# Patient Record
Sex: Male | Born: 1945 | Race: Black or African American | Hispanic: No | Marital: Married | Smoking: Former smoker
Health system: Southern US, Community
[De-identification: ages and names within clinical notes are randomized; demographics above are authoritative.]

## PROBLEM LIST (undated history)

## (undated) DIAGNOSIS — E785 Hyperlipidemia, unspecified: Secondary | ICD-10-CM

## (undated) DIAGNOSIS — Z789 Other specified health status: Secondary | ICD-10-CM

## (undated) DIAGNOSIS — N400 Enlarged prostate without lower urinary tract symptoms: Secondary | ICD-10-CM

## (undated) DIAGNOSIS — I214 Non-ST elevation (NSTEMI) myocardial infarction: Secondary | ICD-10-CM

## (undated) DIAGNOSIS — K8020 Calculus of gallbladder without cholecystitis without obstruction: Secondary | ICD-10-CM

## (undated) DIAGNOSIS — I10 Essential (primary) hypertension: Secondary | ICD-10-CM

## (undated) DIAGNOSIS — Z951 Presence of aortocoronary bypass graft: Secondary | ICD-10-CM

## (undated) DIAGNOSIS — I2510 Atherosclerotic heart disease of native coronary artery without angina pectoris: Secondary | ICD-10-CM

## (undated) HISTORY — DX: Essential (primary) hypertension: I10

## (undated) HISTORY — DX: Benign prostatic hyperplasia without lower urinary tract symptoms: N40.0

---

## 1967-05-16 HISTORY — PX: APPENDECTOMY: SHX54

## 2000-06-14 ENCOUNTER — Ambulatory Visit (HOSPITAL_COMMUNITY): Admission: RE | Admit: 2000-06-14 | Discharge: 2000-06-14 | Payer: Self-pay | Admitting: Gastroenterology

## 2008-09-29 ENCOUNTER — Ambulatory Visit (HOSPITAL_COMMUNITY): Admission: RE | Admit: 2008-09-29 | Discharge: 2008-09-29 | Payer: Self-pay | Admitting: Gastroenterology

## 2009-11-29 ENCOUNTER — Encounter: Admission: RE | Admit: 2009-11-29 | Discharge: 2009-11-29 | Payer: Self-pay | Admitting: Emergency Medicine

## 2010-01-13 HISTORY — PX: HERNIA REPAIR: SHX51

## 2010-09-27 NOTE — Op Note (Signed)
NAME:  Tyler Fox, Tyler Fox NO.:  [id number]   MEDICAL RECORD NO.:  [id number]          PATIENT TYPE:  AMB   LOCATION:  ENDO                         FACILITY:  [facility]   PHYSICIAN:  Bernette Redbird, M.D.   DATE OF BIRTH:  [birth year]   DATE OF PROCEDURE:  09/29/2008  DATE OF DISCHARGE:                               OPERATIVE REPORT   PROCEDURE:  Colonoscopy.   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   FINDINGS:  Normal exam.   DESCRIPTION OF PROCEDURE:  The nature, purpose and risks of the  procedure were familiar to the patient from prior examination and he  provided written consent.  Sedation was fentanyl 100 mcg and Versed 8 mg  IV without arrhythmias or desaturation.  Digital exam of the prostate  was normal.  The Pentax adult video colonoscope was advanced to the  terminal ileum without significant difficulty, using some external  abdominal compression to control looping, and pullback was then  performed.  The quality of the prep was excellent and it was felt that  all areas were well seen.   This was a normal examination.  No polyps, cancer, colitis, vascular  malformations or diverticulosis were noted and retroflexion in the  rectum was normal other than and few foci of rectal erythema.  No  biopsies were obtained.  The patient tolerated the procedure well and  there were no apparent complications.   IMPRESSION:  Normal screening colonoscopy in a standard risk individual.   PLAN:  Follow-up colonoscopy in 10 years per the current recommendations  of the [employer] cancer Society.           ______________________________  Bernette Redbird, M.D.     RB/MEDQ  D:  09/29/2008  T:  09/29/2008  Job:  [id number]   cc:   Reuben Likes, M.D.  Fax: [phone]

## 2010-09-30 NOTE — Procedures (Signed)
[facility]  Patient:    Tyler Fox, Tyler Fox                     MRN: [id number] Proc. Date: 06/14/00 Adm. Date:  [id number] Disc. Date: [id number] Attending:  Rich Brave CC:         Reuben Likes, M.D.   Procedure Report  PROCEDURE:  Colonoscopy.  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  He is asymptomatic.  FINDINGS:  Normal examination to the cecum.  DESCRIPTION OF PROCEDURE:  The nature, purpose and risks of the procedure have been discussed with the patient, who provided written consent.  Sedation was fentanyl 50 mcg and Versed 5 mg IV, without arrhythmias or desaturation.  The Olympus adult video colonoscope was advanced to the cecum following an unremarkable digital rectal exam, including the prostate gland.  The cecum was identified by typical cecal appearance and the absence of further lumen. Pullback was then performed.  The quality of the prep was very good and it is felt that all areas were well seen.  This was a normal examination.  No polyps, cancer, colitis, vascular malformations or diverticulosis were noted.  Retroflexion in the the rectum was normal.  No biopsies were obtained.  The patient tolerated the procedure well and there were no apparent complications.  IMPRESSION:  Normal colonoscopy.  PLAN:  Consider follow-up colonoscopy, versus flexible sigmoidoscopy, five years from now. DD:  06/14/00 TD:  06/16/00 Job: [id number]

## 2012-04-16 ENCOUNTER — Emergency Department (HOSPITAL_COMMUNITY): Payer: BC Managed Care – PPO

## 2012-04-16 ENCOUNTER — Encounter (HOSPITAL_COMMUNITY): Payer: Self-pay | Admitting: Emergency Medicine

## 2012-04-16 ENCOUNTER — Inpatient Hospital Stay (HOSPITAL_COMMUNITY)
Admission: EM | Admit: 2012-04-16 | Discharge: 2012-04-24 | DRG: 546 | Disposition: A | Discharge status: Home / Self Care | Payer: BC Managed Care – PPO | Attending: Cardiothoracic Surgery | Admitting: Cardiothoracic Surgery

## 2012-04-16 DIAGNOSIS — R030 Elevated blood-pressure reading, without diagnosis of hypertension: Secondary | ICD-10-CM | POA: Diagnosis present

## 2012-04-16 DIAGNOSIS — E8779 Other fluid overload: Secondary | ICD-10-CM | POA: Diagnosis present

## 2012-04-16 DIAGNOSIS — I498 Other specified cardiac arrhythmias: Secondary | ICD-10-CM

## 2012-04-16 DIAGNOSIS — Z8249 Family history of ischemic heart disease and other diseases of the circulatory system: Secondary | ICD-10-CM

## 2012-04-16 DIAGNOSIS — R001 Bradycardia, unspecified: Secondary | ICD-10-CM | POA: Diagnosis present

## 2012-04-16 DIAGNOSIS — I214 Non-ST elevation (NSTEMI) myocardial infarction: Principal | ICD-10-CM | POA: Diagnosis present

## 2012-04-16 DIAGNOSIS — D696 Thrombocytopenia, unspecified: Secondary | ICD-10-CM | POA: Diagnosis present

## 2012-04-16 DIAGNOSIS — I2510 Atherosclerotic heart disease of native coronary artery without angina pectoris: Secondary | ICD-10-CM

## 2012-04-16 DIAGNOSIS — J189 Pneumonia, unspecified organism: Secondary | ICD-10-CM | POA: Diagnosis present

## 2012-04-16 DIAGNOSIS — Z951 Presence of aortocoronary bypass graft: Secondary | ICD-10-CM

## 2012-04-16 DIAGNOSIS — I252 Old myocardial infarction: Secondary | ICD-10-CM

## 2012-04-16 DIAGNOSIS — D62 Acute posthemorrhagic anemia: Secondary | ICD-10-CM | POA: Diagnosis not present

## 2012-04-16 DIAGNOSIS — D72819 Decreased white blood cell count, unspecified: Secondary | ICD-10-CM | POA: Diagnosis present

## 2012-04-16 DIAGNOSIS — E785 Hyperlipidemia, unspecified: Secondary | ICD-10-CM | POA: Diagnosis not present

## 2012-04-16 HISTORY — DX: Non-ST elevation (NSTEMI) myocardial infarction: I21.4

## 2012-04-16 HISTORY — DX: Presence of aortocoronary bypass graft: Z95.1

## 2012-04-16 HISTORY — DX: Atherosclerotic heart disease of native coronary artery without angina pectoris: I25.10

## 2012-04-16 HISTORY — DX: Hyperlipidemia, unspecified: E78.5

## 2012-04-16 HISTORY — DX: Other specified health status: Z78.9

## 2012-04-16 LAB — URINALYSIS, ROUTINE W REFLEX MICROSCOPIC
Glucose, UA: NEGATIVE mg/dL
Protein, ur: NEGATIVE mg/dL
Specific Gravity, Urine: 1.029 (ref 1.005–1.030)
Urobilinogen, UA: 0.2 mg/dL (ref 0.0–1.0)

## 2012-04-16 LAB — CBC WITH DIFFERENTIAL/PLATELET
Basophils Absolute: 0 10*3/uL (ref 0.0–0.1)
Basophils Relative: 0 % (ref 0–1)
Eosinophils Absolute: 0.1 10*3/uL (ref 0.0–0.7)
Eosinophils Relative: 4 % (ref 0–5)
Lymphs Abs: 1.2 10*3/uL (ref 0.7–4.0)
MCHC: 33.6 g/dL (ref 30.0–36.0)
MCV: 81.1 fL (ref 78.0–100.0)
Monocytes Absolute: 0.4 10*3/uL (ref 0.1–1.0)
WBC: 3.6 10*3/uL — ABNORMAL LOW (ref 4.0–10.5)

## 2012-04-16 LAB — LIPID PANEL
Cholesterol: 266 mg/dL — ABNORMAL HIGH (ref 0–200)
HDL: 60 mg/dL (ref >39)
LDL Cholesterol: 183 mg/dL — ABNORMAL HIGH (ref 0–99)
Total CHOL/HDL Ratio: 4.4 RATIO
Triglycerides: 113 mg/dL (ref <150)
VLDL: 23 mg/dL (ref 0–40)

## 2012-04-16 LAB — BASIC METABOLIC PANEL
BUN: 17 mg/dL (ref 6–23)
Calcium: 9.6 mg/dL (ref 8.4–10.5)
Chloride: 100 mEq/L (ref 96–112)
GFR calc Af Amer: 74 mL/min — ABNORMAL LOW (ref >90)
GFR calc non Af Amer: 64 mL/min — ABNORMAL LOW (ref >90)

## 2012-04-16 LAB — POCT I-STAT TROPONIN I
Troponin i, poc: 0.27 ng/mL (ref 0.00–0.08)
Troponin i, poc: 0.78 ng/mL (ref 0.00–0.08)

## 2012-04-16 LAB — TROPONIN I
Troponin I: <0.3 ng/mL (ref <0.30)
Troponin I: 2.05 ng/mL (ref <0.30)
Troponin I: 4.11 ng/mL (ref <0.30)

## 2012-04-16 LAB — HEPARIN LEVEL (UNFRACTIONATED): Heparin Unfractionated: 0.13 IU/mL — ABNORMAL LOW (ref 0.30–0.70)

## 2012-04-16 MED ORDER — NITROGLYCERIN 0.4 MG SL SUBL: 0.4000 mg | SUBLINGUAL_TABLET | SUBLINGUAL | Status: DC | PRN

## 2012-04-16 MED ORDER — ADULT MULTIVITAMIN W/MINERALS CH
1.0000 | ORAL_TABLET | Freq: Every day | ORAL | Status: DC
  Administered 2012-04-17 – 2012-04-20 (×3): 1 via ORAL
  Filled 2012-04-16 (×5): qty 1

## 2012-04-16 MED ORDER — ONDANSETRON HCL 4 MG/2ML IJ SOLN: 4.0000 mg | Freq: Four times a day (QID) | INTRAMUSCULAR | Status: DC | PRN

## 2012-04-16 MED ORDER — ACETAMINOPHEN 650 MG RE SUPP: 650.0000 mg | Freq: Four times a day (QID) | RECTAL | Status: DC | PRN

## 2012-04-16 MED ORDER — HEPARIN (PORCINE) IN NACL 100-0.45 UNIT/ML-% IJ SOLN
1100.0000 [IU]/h | INTRAMUSCULAR | Status: DC
  Administered 2012-04-16: 1000 [IU]/h via INTRAVENOUS
  Filled 2012-04-16 (×2): qty 250

## 2012-04-16 MED ORDER — ACETAMINOPHEN 325 MG PO TABS: 650.0000 mg | ORAL_TABLET | ORAL | Status: DC | PRN

## 2012-04-16 MED ORDER — SODIUM CHLORIDE 0.9 % IV BOLUS (SEPSIS)
1000.0000 mL | Freq: Once | INTRAVENOUS | Status: AC
  Administered 2012-04-16: 1000 mL via INTRAVENOUS

## 2012-04-16 MED ORDER — ONDANSETRON HCL 4 MG PO TABS: 4.0000 mg | ORAL_TABLET | Freq: Four times a day (QID) | ORAL | Status: DC | PRN

## 2012-04-16 MED ORDER — NITROGLYCERIN IN D5W 200-5 MCG/ML-% IV SOLN
3.0000 ug/min | INTRAVENOUS | Status: DC
  Administered 2012-04-16: 3 ug/min via INTRAVENOUS
  Filled 2012-04-16: qty 250

## 2012-04-16 MED ORDER — MORPHINE SULFATE 4 MG/ML IJ SOLN: 4.0000 mg | INTRAMUSCULAR | Status: DC | PRN

## 2012-04-16 MED ORDER — ACETAMINOPHEN 325 MG PO TABS: 650.0000 mg | ORAL_TABLET | Freq: Four times a day (QID) | ORAL | Status: DC | PRN

## 2012-04-16 MED ORDER — HEPARIN BOLUS VIA INFUSION
2300.0000 [IU] | Freq: Once | INTRAVENOUS | Status: AC
  Administered 2012-04-16: 2300 [IU] via INTRAVENOUS
  Filled 2012-04-16: qty 2300

## 2012-04-16 MED ORDER — HEPARIN BOLUS VIA INFUSION
4000.0000 [IU] | Freq: Once | INTRAVENOUS | Status: AC
  Administered 2012-04-16: 4000 [IU] via INTRAVENOUS

## 2012-04-16 MED ORDER — GI COCKTAIL ~~LOC~~
30.0000 mL | Freq: Once | ORAL | Status: AC
  Administered 2012-04-16: 30 mL via ORAL
  Filled 2012-04-16: qty 30

## 2012-04-16 MED ORDER — PANTOPRAZOLE SODIUM 40 MG PO TBEC
40.0000 mg | DELAYED_RELEASE_TABLET | Freq: Every day | ORAL | Status: DC
  Administered 2012-04-17 – 2012-04-18 (×2): 40 mg via ORAL
  Filled 2012-04-16 (×2): qty 1

## 2012-04-16 MED ORDER — SODIUM CHLORIDE 0.9 % IJ SOLN
3.0000 mL | Freq: Two times a day (BID) | INTRAMUSCULAR | Status: DC
  Administered 2012-04-16 – 2012-04-17 (×2): 3 mL via INTRAVENOUS

## 2012-04-16 MED ORDER — SODIUM CHLORIDE 0.9 % IV SOLN
Freq: Once | INTRAVENOUS | Status: AC
  Administered 2012-04-16: 08:00:00 via INTRAVENOUS

## 2012-04-16 MED ORDER — ZOLPIDEM TARTRATE 5 MG PO TABS: 5.0000 mg | ORAL_TABLET | Freq: Every evening | ORAL | Status: DC | PRN

## 2012-04-16 MED ORDER — ASPIRIN EC 325 MG PO TBEC
325.0000 mg | DELAYED_RELEASE_TABLET | Freq: Every day | ORAL | Status: DC
  Administered 2012-04-17 – 2012-04-18 (×2): 325 mg via ORAL
  Filled 2012-04-16 (×3): qty 1

## 2012-04-16 MED ORDER — OMEGA-3-ACID ETHYL ESTERS 1 G PO CAPS
1.0000 g | ORAL_CAPSULE | Freq: Every day | ORAL | Status: DC
  Administered 2012-04-17: 1 g via ORAL
  Filled 2012-04-16 (×2): qty 1

## 2012-04-16 MED ORDER — ALPRAZOLAM 0.25 MG PO TABS: 0.2500 mg | ORAL_TABLET | Freq: Three times a day (TID) | ORAL | Status: DC | PRN

## 2012-04-16 NOTE — ED Notes (Signed)
Pt denies chest pain, states he feels some nagging indegestion. Rates it at 2/10

## 2012-04-16 NOTE — ED Notes (Signed)
Started to have indigestion like pain in chest at 4 am had some burping this am no n/v/sob took 3 baby asa this am

## 2012-04-16 NOTE — ED Provider Notes (Addendum)
History     CSN: [id number]  Arrival date & time 04/16/12  4098   First MD Initiated Contact with Patient 04/16/12 [phone]      No chief complaint on file.   (Consider location/radiation/quality/duration/timing/severity/associated sxs/prior treatment) HPI Comments: Pt with no known medical hx besides GERD comes in with cc of indigestion, chest discomfort. Pt reports that starting 4 am he has been having some chest discomfort, similar to his previous indigestions symptoms, but that the sx are lasting longer and is more severe. The chest discomfort is mid-sternal, non radiating and has is better with walking. No radiation, and there is no associated n/v/f/c/cough/sob/diophoresis. Pt has no hx of CAD, and he denies any illicit use smoking hx. There is family hx of CAD, his siblings around the same age as his. 3 ASA taken.   The history is provided by the patient.    History reviewed. No pertinent past medical history.  Past Surgical History  Procedure Date  . Appendectomy     No family history on file.  History  Substance Use Topics  . Smoking status: Not on file  . Smokeless tobacco: Not on file  . Alcohol Use:       Review of Systems  Constitutional: Negative for activity change.  Respiratory: Negative for cough, shortness of breath and wheezing.   Cardiovascular: Positive for chest pain.  Gastrointestinal: Negative for nausea, vomiting and abdominal pain.  Genitourinary: Negative for dysuria.  Neurological: Negative for syncope.    Allergies  Review of patient's allergies indicates no known allergies.  Home Medications   Current Outpatient Rx  Name  Route  Sig  Dispense  Refill  . CINNAMON PO   Oral   Take 1 tablet by mouth daily.         Marland Kitchen VITAMIN B-12 PO   Oral   Take 1 tablet by mouth daily.         Marland Kitchen MAGNESIUM PO   Oral   Take 1 tablet by mouth daily.         . ADULT MULTIVITAMIN W/MINERALS CH   Oral   Take 1 tablet by mouth daily.          Marland Kitchen FISH OIL PO   Oral   Take 1 tablet by mouth daily.           BP 181/86  Pulse 59  Temp 97.4 F (36.3 C) (Oral)  Resp 20  SpO2 100%  Physical Exam  Nursing note and vitals reviewed. Constitutional: He is oriented to person, place, and time. He appears well-developed.  HENT:  Head: Normocephalic and atraumatic.  Eyes: Conjunctivae normal and EOM are normal. Pupils are equal, round, and reactive to light.  Neck: Normal range of motion. Neck supple. No JVD present.  Cardiovascular: Normal rate and regular rhythm.   No murmur heard. Pulmonary/Chest: Effort normal and breath sounds normal.  Abdominal: Soft. Bowel sounds are normal. He exhibits no distension and no mass. There is no tenderness. There is no rebound and no guarding.  Neurological: He is alert and oriented to person, place, and time.  Skin: Skin is warm.    ED Course  Procedures (including critical care time)  Labs Reviewed  CBC WITH DIFFERENTIAL - Abnormal; Notable for the following:    WBC 3.6 (*)     Platelets 137 (*)     All other components within normal limits  BASIC METABOLIC PANEL  URINALYSIS, ROUTINE W REFLEX MICROSCOPIC  TROPONIN I   Dg  Chest Port 1 View  04/16/2012  *RADIOLOGY REPORT*  Clinical Data: Chest pain.  PORTABLE CHEST - 1 VIEW  Comparison: 11/29/2009  Findings: Heart and mediastinal contours are within normal limits. No focal opacities or effusions.  No acute bony abnormality.  IMPRESSION: No active cardiopulmonary disease.   Original Report Authenticated By: Charlett Nose, M.D.      No diagnosis found.    MDM   Date: 04/16/2012  Rate: 65  Rhythm: normal sinus rhythm  QRS Axis: normal  Intervals: normal  ST/T Wave abnormalities: nonspecific ST/T changes  Conduction Disutrbances:none  Narrative Interpretation:   Old EKG Reviewed: none available  Differential diagnosis includes: ACS syndrome CHF exacerbation Valvular disorder Myocarditis Pericarditis Pericardial  effusion Pneumonia Pleural effusion Pulmonary edema PE GERD  Pt comes in with cc of indigestion and chest discomfort. His cardiac risk factors are Age, Gender and Family hx. No cardiac workup done in the past. EKG shows no acute findings, there are some t wave flattening and non specific ST changes. Will get basic cardiac labs. Abd exam is benign and no concerns for pancreatitis, hepatobiliary process.   Derwood Kaplan, MD 04/16/12 0841  9:06 AM Troponin is negative, we will move patient to CDU for Stress echo.  Derwood Kaplan, MD 04/16/12 0908   Date: 04/16/2012  Rate: 55  Rhythm: sinus bradycardia  QRS Axis: normal  Intervals: normal  ST/T Wave abnormalities: nonspecific ST/T changes  Conduction Disutrbances:none  Narrative Interpretation:   Old EKG Reviewed: changes noted More prominent ST depression in the inferior lead with slight ST elevation v2  12:03 PM Pt's repeat troponin is elevated - repeat EKG has some dynamic changes. [facility] was consulted as Cardiology on call. We will notify the interventional Cardiology about the patient, since he is having off and on dyspepsia in light of troponin elevation and EKG changes. Hepatin initiated. Patient and family informed.  CRITICAL CARE Performed by: Derwood Kaplan   Total critical care time: 45 minutes  Critical care time was exclusive of separately billable procedures and treating other patients.  Critical care was necessary to treat or prevent imminent or life-threatening deterioration.  Critical care was time spent personally by me on the following activities: development of treatment plan with patient and/or surrogate as well as nursing, discussions with consultants, evaluation of patient's response to treatment, examination of patient, obtaining history from patient or surrogate, ordering and performing treatments and interventions, ordering and review of laboratory studies, ordering and review  of radiographic studies, pulse oximetry and re-evaluation of patient's condition.    Derwood Kaplan, MD 04/16/12 1204

## 2012-04-16 NOTE — H&P (Signed)
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 The patient described this discomfort as dyspepsia. The patient states that he has had dyspepsia for the past 8-9 months, but this was different in that it lasted longer and it was more severe. In addition, he states that this was more substernal as opposed to previous dyspepsia episodes. He denied any shortness of breath, dizziness, syncope, nausea, vomiting, diaphoresis. There was no radiation of his symptoms. The patient took 3 aspirin prior to coming to the emergency department. He has prior history of coronary disease. The patient does not smoke, drink, or do illegal drugs. Initial troponin was negative. However repeat troponin 3-1/2  hours later showed increased to 0.27.  Initial EKG at 0800 hours was essentially normal. Repeat EKG  At 1130 showed T-wave inversion V5.  [facility] was called and have seen the patient. The patient occasionally takes ibuprofen, last used 2 days ago. The patient states that he actually walk on his treadmill for 35 minutes at 3.9 mi./hr yesterday without any symptoms. The patient was noted to be bradycardic in the emergency department with a heart rate between 40 and 60, but he is hemodynamically stable. His most recent blood pressure was 157/84. The patient was initially hypertensive when he first arrived with a blood pressure 181/86. Assessment/Plan: NSTEMI -Patient is asymptomatic on heparin drip--continue -Patient has been started on nitroglycerin drip by cardiology -Check lipids -Check hemoglobin A1c -Echocardiogram -Cycle troponins -ASA 325mg  Mild leukopenia Bradycardia -Beta blockade decision per cardiology Elevated  blood pressure without diagnosis of hypertension        History reviewed. No pertinent past medical history. Past Surgical History  Procedure Date  . Appendectomy    Social History: Denies any tobacco, alcohol, or illegal drug use.  FamHx: Father is deceased. Had a myocardial infarction at age 25. Mother is deceased. Presumed myocardial infarction at age 73. 2 brothers with coronary artery disease, ages 69 and 59. Sisters with coronary disease in their 31s.  No Known Allergies    Prior to Admission medications   Medication Sig Start Date End Date Taking? Authorizing Provider  CINNAMON PO Take 1 tablet by mouth daily.   Yes Historical Provider, MD  Cyanocobalamin (VITAMIN B-12 PO) Take 1 tablet by mouth daily.   Yes Historical Provider, MD  MAGNESIUM PO Take 1 tablet by mouth daily.   Yes Historical Provider, MD  Multiple Vitamin (MULTIVITAMIN WITH MINERALS) TABS Take 1 tablet by mouth daily.   Yes Historical Provider, MD  Omega-3 Fatty Acids (FISH OIL PO) Take 1 tablet by mouth daily.   Yes Historical Provider, MD    Review of Systems:  Constitutional:  No night sweats, Fevers, chills, fatigue.  Head&Eyes: No headache.  No vision loss ENT:  No Difficulty swallowing, Cardio-vascular:  No chest pain, Orthopnea, PND, swelling in lower extremities,  dizziness, palpitations  GI:  No  abdominal pain, nausea, vomiting, diarrhea, loss of appetite, Resp:  No shortness of breath with exertion or at rest. No cough. No coughing up of blood  Skin:  no rash or lesions.  GU:  no dysuria,   Psych:  No change in mood or affect. No depression or anxiety. Neurologic: No headache, no dysesthesia, no focal weakness, no vision loss.  No syncope  Physical Exam: Filed Vitals:   04/16/12 1100 04/16/12 1130 04/16/12 1145 04/16/12 1200  BP: 114/56 124/67 134/68 145/76  Pulse: 43 50 55 53  Temp:      TempSrc:      Resp:   19 19  Height: 5\' 11"  (1.803 m)     Weight: 79.379 kg (175 lb)      SpO2: 98% 99% 100% 100%   General:  A&O x 3, NAD, nontoxic, pleasant/cooperative Head/Eye: No conjunctival hemorrhage, no icterus, [place]/AT, No nystagmus ENT:  No icterus,  No thrush, good dentition, no pharyngeal exudate Neck:  No masses, no lymphadenpathy, no bruits CV:  RRR, no rub, no gallop, no S3 Lung:  CTAB, good air movement, no wheeze, no rhonchi Abdomen: soft/NT, +BS, nondistended, no peritoneal signs Ext: No cyanosis, No rashes, No petechiae, No lymphangitis, No edema   Labs on Admission:  Basic Metabolic Panel:  Lab 04/16/12 1610  NA 137  K 3.5  CL 100  CO2 28  GLUCOSE 126*  BUN 17  CREATININE 1.14  CALCIUM 9.6  MG --  PHOS --   Liver Function Tests: No results found for this basename: AST:5,ALT:5,ALKPHOS:5,BILITOT:5,PROT:5,ALBUMIN:5 in the last 168 hours No results found for this basename: LIPASE:5,AMYLASE:5 in the last 168 hours No results found for this basename: AMMONIA:5 in the last 168 hours CBC:  Lab 04/16/12 0756  WBC 3.6*  NEUTROABS 1.9  HGB 14.4  HCT 42.8  MCV 81.1  PLT 137*   Cardiac Enzymes:  Lab 04/16/12 0756  CKTOTAL --  CKMB --  CKMBINDEX --  TROPONINI <0.30   BNP: No components found with this basename: POCBNP:5 CBG: No results found for this basename: GLUCAP:5 in the last 168 hours  Radiological Exams on Admission: Dg Chest Port 1 View  04/16/2012  *RADIOLOGY REPORT*  Clinical Data: Chest pain.  PORTABLE CHEST - 1 VIEW  Comparison: 11/29/2009  Findings: Heart and mediastinal contours are within normal limits. No focal opacities or effusions.  No acute bony abnormality.  IMPRESSION: No active cardiopulmonary disease.   Original Report Authenticated By: Charlett Nose, M.D.     EKG: Independently reviewed. Sinus rhythm, T wave inversion V5    Time spent:50 minutes Code Status:   FULL Family Communication:   Wife at bedside   Tyler Vangieson, DO  [facility] Pager [phone]  If 7PM-7AM, please contact  night-coverage [email or web address] Password [email or web address] 04/16/2012, 1:06 PM

## 2012-04-16 NOTE — Progress Notes (Signed)
ANTICOAGULATION CONSULT NOTE - Initial Consult  Pharmacy Consult for  Heparin Indication:  Chest Pain/ACS  No Known Allergies  Patient Measurements: Height:  5'11'' Weight:  175 pounds Heparin dosing weight:  79kg  Vital Signs: Temp: 97.4 F (36.3 C) (12/03 0749) Temp src: Oral (12/03 0749) BP: 140/68 mmHg (12/03 1049) Pulse Rate: 49  (12/03 1049)  Labs:  [facility] 04/16/12 0756  HGB 14.4  HCT 42.8  PLT 137*  APTT --  LABPROT --  INR --  HEPARINUNFRC --  CREATININE 1.14  CKTOTAL --  CKMB --  TROPONINI <0.30   Medical History: No significant PMH noted.  Assessment: 65 yo male admitted with c/o chest pain, worsening discomfort this morning.  He has no significant past medical history of CAD.  His initial troponin is negative.  We have been asked to initiate IV heparin until his cardiac work-up is complete.  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  He states he has no noted bleeding history, but his platelets are slightly low at 137K.  His H/H are within normal limits and he is absent any active bleeding.  Goal of Therapy:  Heparin level 0.3-0.7 units/ml Monitor platelets by anticoagulation protocol: Yes   Plan:   Heparin 4000 units IV bolus  Heparin infusion at 1000 units/hr  Heparin level in 6 hours and adjust based on results  Monitor for s/s of bleeding  Nadara Mustard, PharmD., MS Clinical Pharmacist Pager:  [phone] Thank you for allowing pharmacy to be part of this patients care team. 04/16/2012,11:31 AM

## 2012-04-16 NOTE — ED Notes (Signed)
Pt amb to BR without problem. UA obtained and sent

## 2012-04-16 NOTE — Progress Notes (Signed)
ANTICOAGULATION CONSULT NOTE - Initial Consult  Pharmacy Consult for  Heparin Indication:  Chest Pain/ACS  No Known Allergies  Patient Measurements: Height:  5'11'' Weight:  175 pounds Heparin dosing weight:  79kg  Vital Signs: Temp: 97.6 F (36.4 C) (12/03 1722) Temp src: Oral (12/03 1722) BP: 148/64 mmHg (12/03 1915) Pulse Rate: 61  (12/03 1915)  Labs:  Basename 04/16/12 1844 04/16/12 1711 04/16/12 0756  HGB -- -- 14.4  HCT -- -- 42.8  PLT -- -- 137*  APTT -- -- --  LABPROT -- -- --  INR -- -- --  HEPARINUNFRC 0.13* -- --  CREATININE -- -- 1.14  CKTOTAL -- -- --  CKMB -- -- --  TROPONINI -- 2.05* <0.30   Medical History: No significant PMH noted.  Assessment: 66 yo male admitted with c/o chest pain, worsening discomfort this morning.  He has no significant past medical history of CAD. He states he has no noted bleeding history, but his platelets are slightly low at 137K.  His H/H are within normal limits and he is absent any active bleeding. Troponins 0.27, 0.78, 2.05. Patient has slightly elevated cholesterol (266) and LDL (183). Heparin level this evening 0.13 < goal.  Goal of Therapy:  Heparin level 0.3-0.7 units/ml Monitor platelets by anticoagulation protocol: Yes   Plan:  Bolus heparin 2300 units and increase infusion rate to 1250 units/hr Recheck heparin level with am labs. Plan cath in am.  Gari Hartsell S. Merilynn Finland, PharmD, BCPS Clinical Staff Pharmacist Pager [phone]  04/16/2012,7:40 PM

## 2012-04-16 NOTE — ED Notes (Signed)
Pt brought to room via wheelchair; pt undressed, in gown, on monitor, continuous pulse oximetry and blood pressure cuff; sheet provided; EKG performed; family at bedside

## 2012-04-16 NOTE — H&P (Signed)
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 His intial Troponin was negative. No acute EKG changes. His second Troponin is positive, 0.27 (POC Troponin). His second EKG did show some TWI in V5 that appeared to be a change. He is currently pain free on IV Heparin. He has been bradycardic at time but asymptomatic.   Problem List: History reviewed. No pertinent past medical history.  Past Surgical History  Procedure Date  . Appendectomy      Allergies: No Known Allergies   Home Medications See Med Rec  No family history on file.   History   Social History  . Marital Status: Married    Spouse Name: N/A    Number of Children: N/A  . Years of Education: N/A   Occupational History  . Not on file.   Social History Main Topics  . Smoking status: Not on file  . Smokeless tobacco: Not on file  . Alcohol Use:   . Drug Use:   . Sexually Active:    Other Topics Concern  . Not on file   Social History Narrative  . No narrative on file     Review of Systems: General: negative for chills, fever, night sweats or weight changes.  Cardiovascular: negative for chest pain, dyspnea on exertion, edema, orthopnea, palpitations, paroxysmal nocturnal dyspnea or shortness of breath Dermatological: negative for rash Respiratory: negative for cough or wheezing Urologic: negative for hematuria Abdominal: negative for nausea, vomiting, diarrhea, bright red blood per rectum, melena, or hematemesis Neurologic: negative for visual changes, syncope, or dizziness All other systems reviewed and are otherwise negative except as noted above.  Physical Exam: Blood pressure 145/76, pulse 53, temperature 97.4 F (36.3 C), temperature  source Oral, resp. rate 19, height 5\' 11"  (1.803 m), weight 79.379 kg (175 lb), SpO2 100.00%.  General appearance: alert, cooperative and no distress Neck: no carotid bruit and no JVD Lungs: clear to auscultation bilaterally Heart: regular rate and rhythm Abdomen: soft, non-tender; bowel sounds normal; no masses,  no organomegaly Extremities: extremities normal, atraumatic, no cyanosis or edema Pulses: 2+ and symmetric Skin: Skin color, texture, turgor normal. No rashes or lesions Neurologic: Grossly normal    Labs:   Results for orders placed during the hospital encounter of 04/16/12 (from the past 24 hour(s))  BASIC METABOLIC PANEL     Status: Abnormal   Collection Time   04/16/12  7:56 AM      Component Value Range   Sodium 137  135 - 145 mEq/L   Potassium 3.5  3.5 - 5.1 mEq/L   Chloride 100  96 - 112 mEq/L   CO2 28  19 - 32 mEq/L   Glucose, Bld 126 (*) 70 - 99 mg/dL   BUN 17  6 - 23 mg/dL   Creatinine, Ser 8.46  0.50 - 1.35 mg/dL   Calcium 9.6  8.4 - 96.2 mg/dL   GFR calc non Af Amer 64 (*) >90 mL/min   GFR calc Af Amer 74 (*) >90 mL/min  CBC WITH DIFFERENTIAL     Status: Abnormal   Collection Time   04/16/12  7:56 AM      Component Value Range   WBC 3.6 (*) 4.0 - 10.5 K/uL   RBC 5.28  4.22 - 5.81 MIL/uL   Hemoglobin 14.4  13.0 - 17.0 g/dL   HCT 11.9  14.7 - 82.9 %   MCV 81.1  78.0 - 100.0 fL   MCH 27.3  26.0 - 34.0 pg   MCHC 33.6  30.0 - 36.0 g/dL   RDW 56.2  13.0 - 86.5 %   Platelets 137 (*) 150 - 400 K/uL   Neutrophils Relative 53  43 - 77 %   Neutro Abs 1.9  1.7 - 7.7 K/uL   Lymphocytes Relative 33  12 - 46 %   Lymphs Abs 1.2  0.7 - 4.0 K/uL   Monocytes Relative 11  3 - 12 %   Monocytes Absolute 0.4  0.1 - 1.0 K/uL   Eosinophils Relative 4  0 - 5 %   Eosinophils Absolute 0.1  0.0 - 0.7 K/uL   Basophils Relative 0  0 - 1 %   Basophils Absolute 0.0  0.0 - 0.1 K/uL  TROPONIN I     Status: Normal   Collection Time   04/16/12  7:56 AM      Component Value Range    Troponin I <0.30  <0.30 ng/mL  URINALYSIS, ROUTINE W REFLEX MICROSCOPIC     Status: Normal   Collection Time   04/16/12 10:17 AM      Component Value Range   Color, Urine YELLOW  YELLOW   APPearance CLEAR  CLEAR   Specific Gravity, Urine 1.029  1.005 - 1.030   pH 7.0  5.0 - 8.0   Glucose, UA NEGATIVE  NEGATIVE mg/dL   Hgb urine dipstick NEGATIVE  NEGATIVE   Bilirubin Urine NEGATIVE  NEGATIVE   Ketones, ur NEGATIVE  NEGATIVE mg/dL   Protein, ur NEGATIVE  NEGATIVE mg/dL   Urobilinogen, UA 0.2  0.0 - 1.0 mg/dL   Nitrite NEGATIVE  NEGATIVE   Leukocytes, UA NEGATIVE  NEGATIVE  POCT I-STAT TROPONIN I     Status: Abnormal   Collection Time   04/16/12 11:09 AM      Component Value Range   Troponin i, poc 0.27 (*) 0.00 - 0.08 ng/mL   Comment NOTIFIED PHYSICIAN     Comment 3              Radiology/Studies: Dg Chest Port 1 View  04/16/2012  *RADIOLOGY REPORT*  Clinical Data: Chest pain.  PORTABLE CHEST - 1 VIEW  Comparison: 11/29/2009  Findings: Heart and mediastinal contours are within normal limits. No focal opacities or effusions.  No acute bony abnormality.  IMPRESSION: No active cardiopulmonary disease.   Original Report Authenticated By: Charlett Nose, M.D.     EKG:NSR/ SB TWI V5  ASSESSMENT AND PLAN:  Principal Problem:  *NSTEMI (non-ST elevated myocardial infarction) Active Problems:  Bradycardia, asymptomatic  Family history of coronary artery disease  Plan- Dr Allyson Sabal to see. We'll go ahead and take him on our service as he now appears to be primarily cardiac. Add NTG IV, ASA, PPI, admit to stepdown. Continue to cycle enzymes. Check lipids.   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 Admitted with upper abdominal/chest pain. Currently pain free. Exam benign. EKG with NSSTTWC (TWI V 4-5), borderline + POCM. Plan admit to stepdown, start IV NTG. No statin  secondary to increase CPK in past. ROMI. Cath tomorrow, sooner if recurrent CP.  Runell Gess 04/16/2012 1:01 PM

## 2012-04-16 NOTE — ED Provider Notes (Signed)
Medical screening examination/treatment/procedure(s) were conducted as a shared visit with non-physician practitioner(s) and myself.  I personally evaluated the patient during the encounter.  Derwood Kaplan, MD 04/16/12 [phone]

## 2012-04-16 NOTE — ED Notes (Signed)
Dr. Berry in to see patient 

## 2012-04-16 NOTE — ED Notes (Signed)
Results of troponin shown to Dr. Rhunette Croft, elevated.

## 2012-04-16 NOTE — ED Provider Notes (Signed)
Tyler Fox is a 66 y.o. male transferred to CDU from Pod B. Signout from Dr. Rhunette Croft as follows: Midsternal CP with no radiation, no associated symptoms. Patient has no coronary  Plan is to draw serial enzymes and sent him for echo in the afternoon.  Pt seen and examined at the bedside he is resting comfortably and is chest pain-free however he does have slight nausea at this time. His lung sounds are clear to auscultation bilaterally and heart is a regular rate and rhythm with no murmurs rubs or gallops abdominal exam is benign with no tenderness to palpation.  11:26 AM patient's second troponin resulted and it was positive at 0.27. Patient will be given heparin as dosed by attending Dr. Rhunette Croft. He will be admitted to [facility] and cardiology consulted. Discussed this with patient and his wife. Patient is still having the dyspepsia which is his anginal equivalent. Discussed with Dr. Rhunette Croft who feels that nitroglycerin is not indicated based on his heart rate. Will bolus him with saline as well.  11:44 AM consults from [facility] cardiology Nada Boozer appreciated: She will come to evaluate the patient.  12:04 PM consult from [facility] hospitalist Dr. Arbutus Leas appreciated. He will come to evaluate the patient.  Results for orders placed during the hospital encounter of 04/16/12  BASIC METABOLIC PANEL      Component Value Range   Sodium 137  135 - 145 mEq/L   Potassium 3.5  3.5 - 5.1 mEq/L   Chloride 100  96 - 112 mEq/L   CO2 28  19 - 32 mEq/L   Glucose, Bld 126 (*) 70 - 99 mg/dL   BUN 17  6 - 23 mg/dL   Creatinine, Ser 9.60  0.50 - 1.35 mg/dL   Calcium 9.6  8.4 - 45.4 mg/dL   GFR calc non Af Amer 64 (*) >90 mL/min   GFR calc Af Amer 74 (*) >90 mL/min  CBC WITH DIFFERENTIAL      Component Value Range   WBC 3.6 (*) 4.0 - 10.5 K/uL   RBC 5.28  4.22 - 5.81 MIL/uL   Hemoglobin 14.4  13.0 - 17.0 g/dL   HCT 09.8  11.9 - 14.7 %   MCV 81.1  78.0 - 100.0 fL   MCH 27.3  26.0 - 34.0 pg   MCHC  33.6  30.0 - 36.0 g/dL   RDW 82.9  56.2 - 13.0 %   Platelets 137 (*) 150 - 400 K/uL   Neutrophils Relative 53  43 - 77 %   Neutro Abs 1.9  1.7 - 7.7 K/uL   Lymphocytes Relative 33  12 - 46 %   Lymphs Abs 1.2  0.7 - 4.0 K/uL   Monocytes Relative 11  3 - 12 %   Monocytes Absolute 0.4  0.1 - 1.0 K/uL   Eosinophils Relative 4  0 - 5 %   Eosinophils Absolute 0.1  0.0 - 0.7 K/uL   Basophils Relative 0  0 - 1 %   Basophils Absolute 0.0  0.0 - 0.1 K/uL  URINALYSIS, ROUTINE W REFLEX MICROSCOPIC      Component Value Range   Color, Urine YELLOW  YELLOW   APPearance CLEAR  CLEAR   Specific Gravity, Urine 1.029  1.005 - 1.030   pH 7.0  5.0 - 8.0   Glucose, UA NEGATIVE  NEGATIVE mg/dL   Hgb urine dipstick NEGATIVE  NEGATIVE   Bilirubin Urine NEGATIVE  NEGATIVE   Ketones, ur NEGATIVE  NEGATIVE mg/dL  Protein, ur NEGATIVE  NEGATIVE mg/dL   Urobilinogen, UA 0.2  0.0 - 1.0 mg/dL   Nitrite NEGATIVE  NEGATIVE   Leukocytes, UA NEGATIVE  NEGATIVE  TROPONIN I      Component Value Range   Troponin I <0.30  <0.30 ng/mL  POCT I-STAT TROPONIN I      Component Value Range   Troponin i, poc 0.27 (*) 0.00 - 0.08 ng/mL   Comment NOTIFIED PHYSICIAN     Comment 3            Dg Chest Port 1 View  04/16/2012  *RADIOLOGY REPORT*  Clinical Data: Chest pain.  PORTABLE CHEST - 1 VIEW  Comparison: 11/29/2009  Findings: Heart and mediastinal contours are within normal limits. No focal opacities or effusions.  No acute bony abnormality.  IMPRESSION: No active cardiopulmonary disease.   Original Report Authenticated By: Charlett Nose, M.D.       Wynetta Emery, PA-C 04/16/12 1530

## 2012-04-16 NOTE — ED Notes (Signed)
O2 applied at 2l lpm via [place]

## 2012-04-16 NOTE — ED Notes (Signed)
Troponin results shown to Dr. Gwendolyn Grant

## 2012-04-17 ENCOUNTER — Other Ambulatory Visit: Payer: Self-pay | Admitting: *Deleted

## 2012-04-17 ENCOUNTER — Inpatient Hospital Stay (HOSPITAL_COMMUNITY): Payer: BC Managed Care – PPO

## 2012-04-17 ENCOUNTER — Encounter (HOSPITAL_COMMUNITY): Payer: BC Managed Care – PPO

## 2012-04-17 ENCOUNTER — Encounter (HOSPITAL_COMMUNITY): Payer: Self-pay | Admitting: Cardiology

## 2012-04-17 ENCOUNTER — Encounter (HOSPITAL_COMMUNITY)
Admission: EM | Disposition: A | Discharge status: Home / Self Care | Payer: Self-pay | Source: Home / Self Care | Attending: Cardiothoracic Surgery

## 2012-04-17 DIAGNOSIS — Z01810 Encounter for preprocedural cardiovascular examination: Secondary | ICD-10-CM

## 2012-04-17 DIAGNOSIS — I2510 Atherosclerotic heart disease of native coronary artery without angina pectoris: Secondary | ICD-10-CM

## 2012-04-17 DIAGNOSIS — E785 Hyperlipidemia, unspecified: Secondary | ICD-10-CM

## 2012-04-17 HISTORY — PX: LEFT HEART CATHETERIZATION WITH CORONARY ANGIOGRAM: SHX5451

## 2012-04-17 HISTORY — DX: Atherosclerotic heart disease of native coronary artery without angina pectoris: I25.10

## 2012-04-17 HISTORY — DX: Hyperlipidemia, unspecified: E78.5

## 2012-04-17 LAB — PROTIME-INR: INR: 1.05 (ref 0.00–1.49)

## 2012-04-17 LAB — HEPARIN LEVEL (UNFRACTIONATED): Heparin Unfractionated: 0.83 IU/mL — ABNORMAL HIGH (ref 0.30–0.70)

## 2012-04-17 LAB — TROPONIN I: Troponin I: 4.06 ng/mL (ref <0.30)

## 2012-04-17 LAB — HEMOGLOBIN A1C: Mean Plasma Glucose: 120 mg/dL — ABNORMAL HIGH (ref <117)

## 2012-04-17 SURGERY — LEFT HEART CATHETERIZATION WITH CORONARY ANGIOGRAM
Anesthesia: LOCAL

## 2012-04-17 MED ORDER — FUROSEMIDE 10 MG/ML IJ SOLN
40.0000 mg | Freq: Once | INTRAMUSCULAR | Status: AC
  Administered 2012-04-17: 40 mg via INTRAVENOUS
  Filled 2012-04-17: qty 4

## 2012-04-17 MED ORDER — POLYETHYLENE GLYCOL 3350 17 G PO PACK
17.0000 g | PACK | Freq: Every day | ORAL | Status: DC
  Filled 2012-04-17 (×3): qty 1

## 2012-04-17 MED ORDER — LIDOCAINE HCL (PF) 1 % IJ SOLN
INTRAMUSCULAR | Status: AC
  Filled 2012-04-17: qty 30

## 2012-04-17 MED ORDER — MIDAZOLAM HCL 2 MG/2ML IJ SOLN
INTRAMUSCULAR | Status: AC
  Filled 2012-04-17: qty 2

## 2012-04-17 MED ORDER — MORPHINE SULFATE 2 MG/ML IJ SOLN: 2.0000 mg | INTRAMUSCULAR | Status: DC | PRN

## 2012-04-17 MED ORDER — HEPARIN (PORCINE) IN NACL 2-0.9 UNIT/ML-% IJ SOLN
INTRAMUSCULAR | Status: AC
  Filled 2012-04-17: qty 1000

## 2012-04-17 MED ORDER — SODIUM CHLORIDE 0.9 % IV SOLN: 1.0000 mL/kg/h | INTRAVENOUS | Status: AC

## 2012-04-17 MED ORDER — SODIUM CHLORIDE 0.9 % IJ SOLN: 3.0000 mL | Freq: Two times a day (BID) | INTRAMUSCULAR | Status: DC

## 2012-04-17 MED ORDER — ATORVASTATIN CALCIUM 40 MG PO TABS
40.0000 mg | ORAL_TABLET | Freq: Every day | ORAL | Status: DC
  Filled 2012-04-17: qty 1

## 2012-04-17 MED ORDER — POLYETHYLENE GLYCOL 3350 17 G PO PACK
17.0000 g | PACK | Freq: Every day | ORAL | Status: DC | PRN
  Administered 2012-04-17 – 2012-04-18 (×2): 17 g via ORAL
  Filled 2012-04-17: qty 1

## 2012-04-17 MED ORDER — NITROGLYCERIN 0.2 MG/ML ON CALL CATH LAB
INTRAVENOUS | Status: AC
  Filled 2012-04-17: qty 1

## 2012-04-17 MED ORDER — HEPARIN (PORCINE) IN NACL 100-0.45 UNIT/ML-% IJ SOLN
1100.0000 [IU]/h | INTRAMUSCULAR | Status: DC
  Administered 2012-04-17: 1100 [IU]/h via INTRAVENOUS
  Filled 2012-04-17 (×2): qty 250

## 2012-04-17 MED ORDER — SODIUM CHLORIDE 0.9 % IV SOLN
1.0000 mL/kg/h | INTRAVENOUS | Status: DC
  Administered 2012-04-17: 1.008 mL/kg/h via INTRAVENOUS

## 2012-04-17 MED ORDER — SODIUM CHLORIDE 0.9 % IJ SOLN: 3.0000 mL | INTRAMUSCULAR | Status: DC | PRN

## 2012-04-17 MED ORDER — SODIUM CHLORIDE 0.9 % IV SOLN
INTRAVENOUS | Status: DC
  Administered 2012-04-17: 20:00:00 via INTRAVENOUS

## 2012-04-17 MED ORDER — HEPARIN SODIUM (PORCINE) 1000 UNIT/ML IJ SOLN
INTRAMUSCULAR | Status: AC
  Filled 2012-04-17: qty 1

## 2012-04-17 MED ORDER — ASPIRIN 81 MG PO CHEW: 324.0000 mg | CHEWABLE_TABLET | ORAL | Status: DC

## 2012-04-17 MED ORDER — ATORVASTATIN CALCIUM 80 MG PO TABS
80.0000 mg | ORAL_TABLET | Freq: Every day | ORAL | Status: DC
  Administered 2012-04-17: 80 mg via ORAL
  Filled 2012-04-17 (×2): qty 1

## 2012-04-17 MED ORDER — SODIUM CHLORIDE 0.9 % IV SOLN: 250.0000 mL | INTRAVENOUS | Status: DC | PRN

## 2012-04-17 MED ORDER — FENTANYL CITRATE 0.05 MG/ML IJ SOLN
INTRAMUSCULAR | Status: AC
  Filled 2012-04-17: qty 2

## 2012-04-17 NOTE — Progress Notes (Signed)
ANTICOAGULATION CONSULT NOTE  Pharmacy Consult for  Heparin Indication:  Chest Pain/ACS  No Known Allergies  Patient Measurements: Height:  5'11'' Weight:  175 pounds Heparin dosing weight:  79kg  Vital Signs: Temp: 98.6 F (37 C) (12/04 0342) Temp src: Oral (12/04 0342) BP: 123/69 mmHg (12/04 0342) Pulse Rate: 57  (12/04 0342)  Labs:  Basename 04/17/12 0540 04/16/12 2248 04/16/12 1844 04/16/12 1711 04/16/12 0756  HGB -- -- -- -- 14.4  HCT -- -- -- -- 42.8  PLT -- -- -- -- 137*  APTT -- -- -- -- --  LABPROT -- -- -- -- --  INR -- -- -- -- --  HEPARINUNFRC 0.83* -- 0.13* -- --  CREATININE -- -- -- -- 1.14  CKTOTAL -- -- -- -- --  CKMB -- -- -- -- --  TROPONINI 4.06* 4.11* -- 2.05* --    Assessment: 66 yo male with chest pain for Heparin  Goal of Therapy:  Heparin level 0.3-0.7 units/ml Monitor platelets by anticoagulation protocol: Yes   Plan:  Decrease 1100 units/hr F/U after cath  Geannie Risen, PharmD, BCPS  04/17/2012,6:56 AM

## 2012-04-17 NOTE — Care Management Note (Addendum)
    Page 1 of 1   04/22/2012     4:06:42 PM   CARE MANAGEMENT NOTE 04/22/2012  Patient:  Tyler Fox, Tyler Fox   Account Number:  [id number]  Date Initiated:  04/17/2012  Documentation initiated by:  Junius Creamer  Subjective/Objective Assessment:   adm w mi     Action/Plan:   lives w wife, pcp dr Leslee Home   Anticipated DC Date:  04/24/2012   Anticipated DC Plan:  HOME W HOME HEALTH SERVICES      DC Planning Services  CM consult      Choice offered to / List presented to:             Status of service:  In process, will continue to follow Medicare Important Message given?   (If response is "NO", the following Medicare IM given date fields will be blank) Date Medicare IM given:   Date Additional Medicare IM given:    Discharge Disposition:    Per UR Regulation:  Reviewed for med. 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  PTA, PT INDEPENDENT, LIVES WITH SPOUSE.  WILL FOLLOW FOR HOME NEEDS AS PT PROGRESSES.  12/4 8:55a debbie dowell rn,bsn [phone]

## 2012-04-17 NOTE — Progress Notes (Signed)
NAME:  Tyler Fox   MRN: [id number] DOB:  [birth year]   ADMIT DATE: 04/16/2012   I have seen and evaluated the patient this morning along with Wilburt Finlay, PA.  I agree with his findings, examination as well as impression recommendations.  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  Mild Inf TWI. No further Sx -- Troponin has plateaued.  Plan LHC today.  Procedure:  Left Heart Catheterization with Native Coronary angiography & possible Percutaneous Coronary Intervention.  The procedure with Risks/Benefits/Alternatives and Indications was reviewed with the patient.  All questions were answered.    Risks / Complications include, but not limited to: Death, MI, CVA/TIA, VF/VT (with defibrillation), Bradycardia (need for temporary pacer placement), contrast induced nephropathy, bleeding / bruising / hematoma / pseudoaneurysm, vascular or coronary injury (with possible emergent CT or Vascular Surgery), adverse medication reactions, infection.    The patient voice understanding and agree to proceed.   I have signed the consent form and placed it on the chart for patient signature and RN witness.     Marykay Lex, M.D., M.S. [facility] [address] [place], [place]  [address]  [phone]  04/17/2012 8:20 AM

## 2012-04-17 NOTE — Progress Notes (Signed)
Pre-op Cardiac Surgery  Carotid Findings:  Bilateral:  No evidence of hemodynamically significant internal carotid artery stenosis.   Vertebral artery flow is antegrade.      Upper Extremity Right Left  Brachial Pressures 139 138  Radial Waveforms Bi Bi  Ulnar Waveforms Bi Bi  Palmar Arch (Allen's Test) Normal with ulnar compression. Did not compress radial due to cath bandages. Normal    Palpable pedal pulses bilaterally.  Farrel Demark, RDMS, RVT 04/17/2012

## 2012-04-17 NOTE — Progress Notes (Signed)
[facility]  Subjective: Still complaining of indigestion.  Feels different compared to yesterday  Objective: Vital signs in last 24 hours: Temp:  [97.6 F (36.4 C)-98.6 F (37 C)] 98.3 F (36.8 C) (12/04 0745) Pulse Rate:  [43-104] 67  (12/04 0745) Resp:  [14-24] 17  (12/04 0745) BP: (114-161)/(56-80) 134/72 mmHg (12/04 0745) SpO2:  [98 %-100 %] 100 % (12/04 0745) Weight:  [79.379 kg (175 lb)] 79.379 kg (175 lb) (12/03 1100) Last BM Date: 04/15/12  Intake/Output from previous day: 12/03 0701 - 12/04 0700 In: 411.5 [P.O.:240; I.V.:171.5] Out: 875 [Urine:875] Intake/Output this shift:    Medications Current Facility-Administered Medications  Medication Dose Route Frequency Provider Last Rate Last Dose  . [COMPLETED] 0.9 %  sodium chloride infusion   Intravenous Once Derwood Kaplan, MD      . acetaminophen (TYLENOL) tablet 650 mg  650 mg Oral Q6H PRN Catarina Hartshorn, MD       Or  . acetaminophen (TYLENOL) suppository 650 mg  650 mg Rectal Q6H PRN Catarina Hartshorn, MD      . acetaminophen (TYLENOL) tablet 650 mg  650 mg Oral Q4H PRN Abelino Derrick, PA      . ALPRAZolam Prudy Feeler) tablet 0.25 mg  0.25 mg Oral TID PRN Abelino Derrick, PA      . aspirin EC tablet 325 mg  325 mg Oral Daily David Tat, MD      . heparin ADULT infusion 100 units/mL (25000 units/250 mL)  1,100 Units/hr Intravenous Continuous Cristal Ford, MD 11 mL/hr at 04/17/12 0710 1,100 Units/hr at 04/17/12 0710  . [COMPLETED] heparin bolus via infusion 2,300 Units  2,300 Units Intravenous Once [employer], PHARMD   2,300 Units at 04/16/12 2017  . [COMPLETED] heparin bolus via infusion 4,000 Units  4,000 Units Intravenous Once Chinita [place], [place]   4,000 Units at 04/16/12 1207  . morphine 4 MG/ML injection 4 mg  4 mg Intravenous Q4H PRN Catarina Hartshorn, MD      . multivitamin with minerals tablet 1 tablet  1 tablet Oral Daily Eda Paschal [place], [place]      . nitroGLYCERIN (NITROSTAT) SL tablet  0.4 mg  0.4 mg Sublingual Q5 Min x 3 PRN Abelino Derrick, PA      . nitroGLYCERIN 0.2 mg/mL in dextrose 5 % infusion  3 mcg/min Intravenous Titrated Abelino Derrick, PA 3 mL/hr at 04/16/12 1900 10 mcg/min at 04/16/12 1900  . omega-3 acid ethyl esters (LOVAZA) capsule 1 g  1 g Oral Daily Eda Paschal [place], [place]      . ondansetron [facility]) tablet 4 mg  4 mg Oral Q6H PRN Catarina Hartshorn, MD       Or  . ondansetron (ZOFRAN) injection 4 mg  4 mg Intravenous Q6H PRN Catarina Hartshorn, MD      . pantoprazole (PROTONIX) EC tablet 40 mg  40 mg Oral Q0600 Eda Paschal [place], [place]   40 mg at 04/17/12 0618  . [COMPLETED] sodium chloride 0.9 % bolus 1,000 mL  1,000 mL Intravenous Once Ankit Nanavati, MD   1,000 mL at 04/16/12 1147  . sodium chloride 0.9 % injection 3 mL  3 mL Intravenous Q12H David Tat, MD   3 mL at 04/16/12 2200  . zolpidem (AMBIEN) tablet 5 mg  5 mg Oral QHS PRN Abelino Derrick, PA      . [DISCONTINUED] nitroGLYCERIN (NITROSTAT) SL tablet 0.4 mg  0.4 mg Sublingual Q5 min PRN  Derwood Kaplan, MD      . [DISCONTINUED] ondansetron (ZOFRAN) injection 4 mg  4 mg Intravenous QID PRN Wynetta Emery, PA-C        PE: General appearance: alert and cooperative Lungs: Decreased BS bilaterally.  No wheeze. Heart: regular rate and rhythm, S1, S2 normal, no murmur, click, rub or gallop Extremities: No LEE Pulses: 2+ and symmetric Skin: Warm and dry Neurologic: Grossly normal  Lab Results:   Basename 04/16/12 0756  WBC 3.6*  HGB 14.4  HCT 42.8  PLT 137*   BMET  Basename 04/16/12 0756  NA 137  K 3.5  CL 100  CO2 28  GLUCOSE 126*  BUN 17  CREATININE 1.14  CALCIUM 9.6   Cholesterol  Basename 04/16/12 1259  CHOL 266*   Lipid Panel     Component Value Date/Time   CHOL 266* 04/16/2012 1259   TRIG 113 04/16/2012 1259   HDL 60 04/16/2012 1259   CHOLHDL 4.4 04/16/2012 1259   VLDL 23 04/16/2012 1259   LDLCALC 183* 04/16/2012 1259   Cardiac Panel (last 3 results)  Basename 04/17/12 0540 04/16/12 2248 04/16/12 1711   CKTOTAL -- -- --  CKMB -- -- --  TROPONINI 4.06* 4.11* 2.05*  RELINDX -- -- --     Assessment/Plan   Principal Problem:  *NSTEMI (non-ST elevated myocardial infarction) Active Problems:  Family history of coronary artery disease  Bradycardia, asymptomatic  Hyperlipidemia  Plan:  Left heart cath today.  NSTEMI.  Troponin peak 4.11.  BP and HR stable.  EKG: sinus brady, possible atrial enlargement. Inferior TWI.   Not on beta blocker because of bradycardia.  LDL 183!  No statin secondary to increase CPK in past.  ? Adding Welchol.  On Lovaza.   LOS: 1 day    Tyler Fox 04/17/2012 8:09 AM

## 2012-04-17 NOTE — CV Procedure (Signed)
[facility] CARDIAC CATHETERIZATION  REPORT  NAME:  Tyler Fox   MRN: [id number] DOB:  [birth year]   ADMIT DATE: 04/16/2012 Procedure Date: 04/17/2012   INTERVENTIONAL CARDIOLOGIST: Marykay Lex, M.D., MS PRIMARY CARE PROVIDER: Roque Lias, MD PRIMARY CARDIOLOGIST: Runell Gess, MD  PATIENT:  Tyler Fox is a 66 y.o. 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   Stabilized overnight on IV Heparin & NTG gtt.  He is referred for invasive coronary evaluation.  PRE-OPERATIVE DIAGNOSIS:    NSTEMI  PROCEDURES PERFORMED:    Left Heart Catheterization and Native Coronary Angiography using 5 Fr Right Radial Artery Access.  Left Ventriculography from RAO projection   PROCEDURE: Consent:  Risks of procedure as well as the alternatives and risks of each were explained to the (patient/caregiver).  Consent for procedure obtained. Consent for signed by MD and patient with RN witness -- placed on chart.  PROCEDURE: The patient was brought to the 2nd Floor [facility] in the fasting state and prepped and draped in the usual sterile fashion for Right Radial access, after a Modified Allen's test with plethysmography revealed excellent Ulnar Artery collateralization. Sterile technique was used including antiseptics, cap, gloves, gown, hand hygiene, mask and sheet.  Skin prep: Chlorhexidine  Time Out: Verified patient identification, verified procedure, site/side was marked, verified correct patient position, special equipment/implants available, medications/allergies/relevent history reviewed, required imaging and test results available.  Performed.  Access: Right Radial Artery; 5 Fr Glide Sheath; Seldinger technique using the Angiocath Micropuncture Kit Diagnostic:  5Fr  TIG 4.0 -- advanced over Versicore wire and exchanged over Long-Exchange Safety-J wire.  Left and Right Coronary Artery Angiography: TIG 4.0  LV Hemodynamics (LV Gram): Angles pigtail.  Catheter removed completely out of the body over Versicore wire.  TR Band:  11 Hours, 1120 mL air; non-occlusive hemostasis.  EBL:   < 5 ml  ANESTHESIA:   Local Lidocaine 4 ml SEDATION:  2 mg IV Versed, 50 mcg IV fentanyl ; Premedication: 5 mg PO Valium MEDICATIONS: Omnipaque Contrast: 90ml  Anticoagulation: IV Heparin 4000 Units  Radial Cocktail: 5 mg Verapamil, 400 mcg NTG, 2 ml 2% Lidocaine in 10 ml NS - Intra Arterial via sheath  Hemodynamics:  Central Aortic / Mean Pressures: 125/65 mmHg; 92 mmHg  Left Ventricular Pressures / EDP: 125/11  mmHg; 25-28 mmHg  Left Ventriculography:  EF: 55 %  Wall Motion: mild basal inferior hypokinesis  Coronary Anatomy: See written note scanned in chart.  Left Main: Large caliber vessel that bifurcates normally into LAD and Circumflex LAD: Large caliber vessel with tandem ~70-80% lesions in the proximal vessel prior to a large Septal Perforator & First Diagonal (D1) followed by at 1-2 additional focal lesions involving the LAD-D1 bifurcation.  Beyond D2, the vessel grows to a Large caliber vessel that wraps the apex with mild luminal irregularities.  D1 & 2: Small to moderate caliber branches that are both relatively proximal vessels perfusing the basal-mid anterolateral wall; D1 is partially involved in the parent LAD lesion, but with <50% stenosis, otherwise both vessels are angiographically normal. Left Circumflex: ~90 degree takeoff with a short proximal stem before bifurcating into moderate to large caliber OM1 and moderate caliber AV-Groove Circumflex (AVG Cx), ~50-60% stenosis pre-bifurcation with ~60-70% proximal OM1 and ~60% proximal AVG  Cx.   The AVG Cx gives off a small OM2 then terminates as two small caliber LPL branches with a ~50% stenosis    OM1: Moderate to large caliber vessel with minimal luminal irregularities after the proximal lesion.  OM 2: small caliber, mild luminal irregularities  RCA: Large caliber dominant vessel, ~30-40% stenosis just   RPDA: Fills via R-R & LAD Septal collaterals  RPL Sysytem:The Right Posterior AV Groove Branch Fills via retrograde flow from PDA collaterals as well as some mild LCx-PL collaterals  PATIENT DISPOSITION:    The patient was transferred to the PACU holding area in a hemodynamicaly stable, chest pain free condition.  The patient tolerated the procedure well, and there were no complications.  The patient was stable before, during, and after the procedure.  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  Well preserved EF with elevated LVEDP.  PLAN OF CARE:  Post cath care -- gentle diuresis  Restart Heparin post TR band removal (6hrs)  CT Surgery consult for CABG consideration; patient has not been on a Thienopyridine.  Add statin   HARDING,DAVID W, M.D., M.S. [facility] [address] [place], [place]  [address]  [phone]  04/17/2012 11:55 AM

## 2012-04-17 NOTE — Progress Notes (Signed)
Pt transferred down CT on heart monitor with RN; no complications

## 2012-04-17 NOTE — Consult Note (Signed)
[address]            [place] [address]          [phone]       [facility] Medical Record #[id number] Date of Birth: 04/11/1946  Referring: No ref. provider found Primary Care: Roque Lias, MD  Chief Complaint:    Chief Complaint  Patient presents with  . Chest Pain    History of Present Illness:     Unstable angina, nonstemi Cath shows severe 3 V CAD with occlusion of RCA.  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 No pertinent past medical history   . NSTEMI (non-ST elevated myocardial infarction) 04/16/12  . 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 Hyperlipidemia 04/17/2012    Past Surgical History  Procedure Date  . Appendectomy     History  Smoking status  . Unknown If Ever Smoked  Smokeless tobacco  . Not on file    History  Alcohol Use: Not on file    History   Social History  . Marital Status: Married    Spouse Name: N/A    Number of Children: N/A  . Years of Education: N/A   Occupational History  . Not on file.   Social History Main Topics  . Smoking status: Unknown If Ever Smoked  . Smokeless tobacco: Not on file  . Alcohol Use: Not on file  . Drug Use: Not on file  . Sexually Active: Not on file   Other Topics Concern  . Not on file   Social History Narrative  . No narrative on file    No Known Allergies  Current Facility-Administered Medications  Medication Dose Route Frequency Provider Last Rate Last Dose  . [EXPIRED] 0.9 %  sodium chloride infusion  1 mL/kg/hr Intravenous Continuous Marykay Lex, MD      . acetaminophen (TYLENOL) tablet  650 mg  650 mg Oral Q6H PRN Catarina Hartshorn, MD       Or  . acetaminophen (TYLENOL) suppository 650 mg  650 mg Rectal Q6H PRN Catarina Hartshorn, MD      . acetaminophen (TYLENOL) tablet 650 mg  650 mg Oral Q4H PRN Abelino Derrick, PA      . ALPRAZolam Prudy Feeler) tablet 0.25 mg  0.25 mg Oral TID PRN Abelino Derrick, PA      . aspirin EC tablet 325 mg  325 mg Oral Daily Catarina Hartshorn, MD   325 mg at 04/17/12 1016  . atorvastatin (LIPITOR) tablet 80 mg  80 mg Oral q1800 Marykay Lex, MD   80 mg at 04/17/12 1802  . [COMPLETED] fentaNYL (SUBLIMAZE) 0.05 MG/ML injection           . [COMPLETED] furosemide (LASIX) injection 40 mg  40 mg Intravenous Once Marykay Lex, MD   40 mg at 04/17/12 1356  . [COMPLETED] heparin 1000 UNIT/ML injection           . [COMPLETED] heparin 2-0.9 UNIT/ML-% infusion           .  heparin ADULT infusion 100 units/mL (25000 units/250 mL)  1,100 Units/hr Intravenous Continuous Anh P Pham, PHARMD      . [COMPLETED] heparin bolus via infusion 2,300 Units  2,300 Units Intravenous Once [employer], PHARMD   2,300 Units at 04/16/12 2017  . [COMPLETED] lidocaine (XYLOCAINE) 1 % injection           . [COMPLETED] midazolam (VERSED) 2 MG/2ML injection           . morphine 2 MG/ML injection 2 mg  2 mg Intravenous Q1H PRN Marykay Lex, MD      . morphine 4 MG/ML injection 4 mg  4 mg Intravenous Q4H PRN Catarina Hartshorn, MD      . multivitamin with minerals tablet 1 tablet  1 tablet Oral Daily Abelino Derrick, [place]   1 tablet at 04/17/12 1016  . nitroGLYCERIN (NITROSTAT) SL tablet 0.4 mg  0.4 mg Sublingual Q5 Min x 3 PRN Abelino Derrick, PA      . [COMPLETED] nitroGLYCERIN (NTG ON-CALL) 0.2 mg/mL injection           . nitroGLYCERIN 0.2 mg/mL in dextrose 5 % infusion  3 mcg/min Intravenous Titrated Abelino Derrick, [place] 3 mL/hr at 04/16/12 1900 10 mcg/min at 04/16/12 1900  . ondansetron (ZOFRAN) tablet 4 mg  4 mg Oral Q6H PRN Catarina Hartshorn, MD       Or  . ondansetron [facility]) injection 4 mg  4 mg Intravenous  Q6H PRN Catarina Hartshorn, MD      . pantoprazole (PROTONIX) EC tablet 40 mg  40 mg Oral Q0600 Eda Paschal [place], [place]   40 mg at 04/17/12 0618  . polyethylene glycol (MIRALAX / GLYCOLAX) packet 17 g  17 g Oral Daily PRN Marykay Lex, MD      . polyethylene glycol [facility] / GLYCOLAX) packet 17 g  17 g Oral Q1400 Kerin Perna, MD      . zolpidem [facility]) tablet 5 mg  5 mg Oral QHS PRN Abelino Derrick, PA      . [DISCONTINUED] 0.9 %  sodium chloride infusion  250 mL Intravenous PRN Marykay Lex, MD      . [DISCONTINUED] 0.9 %  sodium chloride infusion  1 mL/kg/hr Intravenous Continuous Marykay Lex, MD 80 mL/hr at 04/17/12 1017 1.008 mL/kg/hr at 04/17/12 1017  . [DISCONTINUED] aspirin chewable tablet 324 mg  324 mg Oral Pre-Cath Marykay Lex, MD      . [DISCONTINUED] atorvastatin (LIPITOR) tablet 40 mg  40 mg Oral q1800 Wilburt Finlay, [place]      . [DISCONTINUED] heparin ADULT infusion 100 units/mL (25000 units/250 mL)  1,100 Units/hr Intravenous Continuous Cristal Ford, MD   1,100 Units/hr at 04/17/12 0710  . [DISCONTINUED] omega-3 acid ethyl esters (LOVAZA) capsule 1 g  1 g Oral Daily Eda Paschal [place], [place]   1 g at 04/17/12 1016  . [DISCONTINUED] sodium chloride 0.9 % injection 3 mL  3 mL Intravenous Q12H Catarina Hartshorn, MD   3 mL at 04/17/12 1017  . [DISCONTINUED] sodium chloride 0.9 % injection 3 mL  3 mL Intravenous Q12H Marykay Lex, MD      . [DISCONTINUED] sodium chloride 0.9 % injection 3 mL  3 mL Intravenous PRN Marykay Lex, MD        Prescriptions prior to admission  Medication Sig Dispense Refill  . CINNAMON PO Take 1 tablet by mouth daily.      . Cyanocobalamin (  VITAMIN B-12 PO) Take 1 tablet by mouth daily.      Marland Kitchen MAGNESIUM PO Take 1 tablet by mouth daily.      . Multiple Vitamin (MULTIVITAMIN WITH MINERALS) TABS Take 1 tablet by mouth daily.      . Omega-3 Fatty Acids (FISH OIL PO) Take 1 tablet by mouth daily.        History reviewed. No pertinent family history.   Review of  Systems:     Cardiac Review of Systems: Y or N  Chest Pain [ y   ]  Resting SOB [  nn ] Exertional SOB  [n  ]  Orthopnea [  ]n   Pedal Edema [   ]    Palpitations [  ] Syncope  [n  ]   Presyncope [n  ]  General Review of Systems: [Y] = yes [  ]=no Constitional: recent weight change [  ]; anorexia [  ]; fatigue [  ]; nausea [  ]; night sweats [  ]; fever [  ]; or chills [  ];                                                                                                                                          Dental: poor dentition[  ]; Last Dentist visit this year  Eye : blurred vision [  ]; diplopia [   ]; vision changes [ y ];  Amaurosis fugax[  ]; Resp: cough [  ];  wheezing[  ];  hemoptysis[  ]; shortness of breath[  ]; paroxysmal nocturnal dyspnea[  ]; dyspnea on exertion[  ]; or orthopnea[  ];  GI:  gallstones[  ], vomiting[  ];  dysphagia[  ]; melena[  ];  hematochezia [  ]; heartburn[  ];   Hx of  Colonoscopy[  ]; GU: kidney stones [  ]; hematuria[  ];   dysuria [  ];  nocturia[  ];  history of     obstruction [  ];             Skin: rash, swelling[  ];, hair loss[  ];  peripheral edema[  ];  or itching[  ]; Musculosketetal: myalgias[  ];  joint swelling[  ];  joint erythema[  ];  joint pain[  ];  back pain[  ];  Heme/Lymph: bruising[  ];  bleeding[  ];  anemia[  ];  Neuro: TIA[  ];  headaches[  ];  stroke[  ];  vertigo[  ];  seizures[  ];   paresthesias[  ];  difficulty walking[  ];  Psych:depression[  ]; anxiety[  ];  Endocrine: diabetes[  ];  thyroid dysfunction[  ];  Immunizations: Flu [  ]; Pneumococcal[  ];  Other:  Physical Exam: BP 143/67  Pulse 66  Temp 98.2 F (36.8 C) (Oral)  Resp 17  Ht 5\' 11"  (1.803 m)  Wt 175 lb (79.379 kg)  BMI 24.41 kg/m2  SpO2 97%  Gen alert,appropriate Heent normocephalic, PERLA Neck w/o bruit, JVD Chest w/o deformity, breath sounds clear COR nSR w/o murmur,gallop Abd soft w/o pulsatile mass Extrem  mild superficial varicosities  RLE Vasc good pulses all extremities Neuro  RHD no motor deficit   Diagnostic Studies & Laboratory data:   COR arteriograms reviewed with patient  Recent Radiology Findings:   Dg Chest Port 1 View  04/16/2012  *RADIOLOGY REPORT*  Clinical Data: Chest pain.  PORTABLE CHEST - 1 VIEW  Comparison: 11/29/2009  Findings: Heart and mediastinal contours are within normal limits. No focal opacities or effusions.  No acute bony abnormality.  IMPRESSION: No active cardiopulmonary disease.   Original Report Authenticated By: Charlett Nose, M.D.       Recent Lab Findings: Lab Results  Component Value Date   WBC 3.6* 04/16/2012   HGB 14.4 04/16/2012   HCT 42.8 04/16/2012   PLT 137* 04/16/2012   GLUCOSE 126* 04/16/2012   CHOL 266* 04/16/2012   TRIG 113 04/16/2012   HDL 60 04/16/2012   LDLCALC 183* 04/16/2012   NA 137 04/16/2012   K 3.5 04/16/2012   CL 100 04/16/2012   CREATININE 1.14 04/16/2012   BUN 17 04/16/2012   CO2 28 04/16/2012   INR 1.05 04/17/2012   HGBA1C 5.8* 04/17/2012      Assessment / Plan:      Plan mutivessel CABG 12-6  AM Procedure discussed with patient and wife      @[email or web address] @ 04/17/2012 6:48 PM

## 2012-04-17 NOTE — Progress Notes (Signed)
ANTICOAGULATION CONSULT NOTE - Follow Up Consult  Pharmacy Consult for heparin Indication: 2 vessel CAD  No Known Allergies  Patient Measurements: Height: 5\' 11"  (180.3 cm) Weight: 175 lb (79.379 kg) IBW/kg (Calculated) : 75.3    Vital Signs: Temp: 98.5 F (36.9 C) (12/04 1200) Temp src: Oral (12/04 1200) BP: 127/56 mmHg (12/04 1200) Pulse Rate: 63  (12/04 1200)  Labs:  Basename 04/17/12 0916 04/17/12 0540 04/16/12 2248 04/16/12 1844 04/16/12 1711 04/16/12 0756  HGB -- -- -- -- -- 14.4  HCT -- -- -- -- -- 42.8  PLT -- -- -- -- -- 137*  APTT -- -- -- -- -- --  LABPROT 13.6 -- -- -- -- --  INR 1.05 -- -- -- -- --  HEPARINUNFRC -- 0.83* -- 0.13* -- --  CREATININE -- -- -- -- -- 1.14  CKTOTAL -- -- -- -- -- --  CKMB -- -- -- -- -- --  TROPONINI -- 4.06* 4.11* -- 2.05* --    Estimated Creatinine Clearance: 66.1 ml/min (by C-G formula based on Cr of 1.14).  Assessment: Patient is a 66 y.o M on heparin for ACS and now s/p cath with 2 vessel disease.  To consult CVTS for CABG and resume heparin 6 hrs post TR band removal.  Spoke to [place] Banker), in process of removing TR band and will remove at 2 PM today.  Will resume heparin at 8 PM today.  Goal of Therapy:  Heparin level 0.3-0.7 units/ml Monitor platelets by anticoagulation protocol: Yes   Plan:  1) Resume heparin drip at 8 PM today at 1100 units/hr 2) check 6 hr heparin level  Makita Blow P 04/17/2012,12:19 PM

## 2012-04-18 ENCOUNTER — Inpatient Hospital Stay (HOSPITAL_COMMUNITY): Payer: BC Managed Care – PPO

## 2012-04-18 ENCOUNTER — Encounter (HOSPITAL_COMMUNITY): Payer: BC Managed Care – PPO

## 2012-04-18 DIAGNOSIS — I2510 Atherosclerotic heart disease of native coronary artery without angina pectoris: Secondary | ICD-10-CM

## 2012-04-18 LAB — BASIC METABOLIC PANEL
BUN: 13 mg/dL (ref 6–23)
CO2: 27 mEq/L (ref 19–32)
Calcium: 9.4 mg/dL (ref 8.4–10.5)
Chloride: 101 mEq/L (ref 96–112)
Creatinine, Ser: 1.02 mg/dL (ref 0.50–1.35)
GFR calc Af Amer: 85 mL/min — ABNORMAL LOW (ref >90)
GFR calc non Af Amer: 73 mL/min — ABNORMAL LOW (ref >90)
Glucose, Bld: 126 mg/dL — ABNORMAL HIGH (ref 70–99)
Potassium: 3.5 mEq/L (ref 3.5–5.1)
Sodium: 139 mEq/L (ref 135–145)

## 2012-04-18 LAB — PULMONARY FUNCTION TEST

## 2012-04-18 LAB — TYPE AND SCREEN
ABO/RH(D): B NEG
Antibody Screen: NEGATIVE

## 2012-04-18 LAB — CBC
MCH: 26.8 pg (ref 26.0–34.0)
MCV: 80.9 fL (ref 78.0–100.0)
Platelets: 124 10*3/uL — ABNORMAL LOW (ref 150–400)
RDW: 13 % (ref 11.5–15.5)

## 2012-04-18 LAB — HEPARIN LEVEL (UNFRACTIONATED): Heparin Unfractionated: 0.32 IU/mL (ref 0.30–0.70)

## 2012-04-18 MED ORDER — HEPARIN (PORCINE) IN NACL 100-0.45 UNIT/ML-% IJ SOLN
1100.0000 [IU]/h | INTRAMUSCULAR | Status: DC
  Filled 2012-04-18 (×2): qty 250

## 2012-04-18 MED ORDER — FENTANYL CITRATE 0.05 MG/ML IJ SOLN: 50.0000 ug | INTRAMUSCULAR | Status: DC | PRN

## 2012-04-18 MED ORDER — DEXTROSE 5 % IV SOLN
1.5000 g | INTRAVENOUS | Status: AC
  Administered 2012-04-19: 1.5 g via INTRAVENOUS
  Administered 2012-04-19: .75 g via INTRAVENOUS
  Filled 2012-04-18 (×2): qty 1.5

## 2012-04-18 MED ORDER — DEXTROSE 5 % IV SOLN
30.0000 ug/min | INTRAVENOUS | Status: DC
Start: 2012-04-19 — End: 2012-04-19
  Filled 2012-04-18: qty 2

## 2012-04-18 MED ORDER — METOPROLOL TARTRATE 12.5 MG HALF TABLET
12.5000 mg | ORAL_TABLET | Freq: Two times a day (BID) | ORAL | Status: DC
  Administered 2012-04-18 (×2): 12.5 mg via ORAL
  Filled 2012-04-18 (×4): qty 1

## 2012-04-18 MED ORDER — DEXMEDETOMIDINE HCL IN NACL 400 MCG/100ML IV SOLN
0.1000 ug/kg/h | INTRAVENOUS | Status: AC
  Administered 2012-04-19: 0.2 ug/kg/h via INTRAVENOUS
  Filled 2012-04-18: qty 100

## 2012-04-18 MED ORDER — MIDAZOLAM HCL 2 MG/2ML IJ SOLN: 1.0000 mg | INTRAMUSCULAR | Status: DC | PRN

## 2012-04-18 MED ORDER — MAGNESIUM SULFATE 50 % IJ SOLN
40.0000 meq | INTRAMUSCULAR | Status: DC
  Filled 2012-04-18: qty 10

## 2012-04-18 MED ORDER — ALPRAZOLAM 0.25 MG PO TABS: 0.2500 mg | ORAL_TABLET | ORAL | Status: DC | PRN

## 2012-04-18 MED ORDER — DEXTROSE 5 % IV SOLN
750.0000 mg | INTRAVENOUS | Status: DC
  Filled 2012-04-18 (×2): qty 750

## 2012-04-18 MED ORDER — SODIUM CHLORIDE 0.9 % IV SOLN
INTRAVENOUS | Status: AC
  Administered 2012-04-19: 69.8 mL/h via INTRAVENOUS
  Filled 2012-04-18: qty 40

## 2012-04-18 MED ORDER — NITROGLYCERIN IN D5W 200-5 MCG/ML-% IV SOLN
2.0000 ug/min | INTRAVENOUS | Status: DC
  Filled 2012-04-18: qty 250

## 2012-04-18 MED ORDER — BISACODYL 5 MG PO TBEC
5.0000 mg | DELAYED_RELEASE_TABLET | Freq: Once | ORAL | Status: DC
  Filled 2012-04-18: qty 1

## 2012-04-18 MED ORDER — EPINEPHRINE HCL 1 MG/ML IJ SOLN
0.5000 ug/min | INTRAVENOUS | Status: DC
  Filled 2012-04-18: qty 4

## 2012-04-18 MED ORDER — CHLORHEXIDINE GLUCONATE 4 % EX LIQD
60.0000 mL | Freq: Once | CUTANEOUS | Status: DC
  Filled 2012-04-18: qty 15

## 2012-04-18 MED ORDER — ROSUVASTATIN CALCIUM 20 MG PO TABS
20.0000 mg | ORAL_TABLET | Freq: Every day | ORAL | Status: DC
  Administered 2012-04-18 – 2012-04-23 (×5): 20 mg via ORAL
  Filled 2012-04-18 (×7): qty 1

## 2012-04-18 MED ORDER — VANCOMYCIN HCL 10 G IV SOLR
1250.0000 mg | INTRAVENOUS | Status: AC
  Administered 2012-04-19: 1250 mg via INTRAVENOUS
  Filled 2012-04-18 (×2): qty 1250

## 2012-04-18 MED ORDER — PLASMA-LYTE 148 IV SOLN
INTRAVENOUS | Status: AC
  Administered 2012-04-19: 10:00:00
  Filled 2012-04-18: qty 2.5

## 2012-04-18 MED ORDER — NON FORMULARY: 20.0000 mg | Freq: Every day | Status: DC

## 2012-04-18 MED ORDER — DOPAMINE-DEXTROSE 3.2-5 MG/ML-% IV SOLN
2.0000 ug/kg/min | INTRAVENOUS | Status: DC
  Filled 2012-04-18: qty 250

## 2012-04-18 MED ORDER — POTASSIUM CHLORIDE 2 MEQ/ML IV SOLN
80.0000 meq | INTRAVENOUS | Status: DC
  Filled 2012-04-18: qty 40

## 2012-04-18 MED ORDER — INSULIN REGULAR HUMAN 100 UNIT/ML IJ SOLN
INTRAMUSCULAR | Status: AC
  Administered 2012-04-19: 1.2 [IU]/h via INTRAVENOUS
  Filled 2012-04-18: qty 1

## 2012-04-18 MED ORDER — TEMAZEPAM 15 MG PO CAPS: 15.0000 mg | ORAL_CAPSULE | Freq: Once | ORAL | Status: AC | PRN

## 2012-04-18 MED ORDER — METOPROLOL TARTRATE 12.5 MG HALF TABLET
12.5000 mg | ORAL_TABLET | Freq: Once | ORAL | Status: AC
  Administered 2012-04-18: 12.5 mg via ORAL
  Filled 2012-04-18: qty 1

## 2012-04-18 MED ORDER — LACTATED RINGERS IV SOLN
INTRAVENOUS | Status: DC
  Administered 2012-04-18: 50 mL/h via INTRAVENOUS
  Filled 2012-04-18: qty 1000

## 2012-04-18 NOTE — Progress Notes (Signed)
[facility]  Subjective: No current complaints  Objective: Vital signs in last 24 hours: Temp:  [98.2 F (36.8 C)-98.6 F (37 C)] 98.6 F (37 C) (12/05 0746) Pulse Rate:  [54-79] 71  (12/05 0746) Resp:  [15-22] 18  (12/04 2000) BP: (93-161)/(49-78) 137/70 mmHg (12/05 0700) SpO2:  [95 %-100 %] 100 % (12/05 0746) Last BM Date: 04/15/12  Intake/Output from previous day: 12/04 0701 - 12/05 0700 In: 962.9 [P.O.:120; I.V.:842.9] Out: 1250 [Urine:1250] Intake/Output this shift:    Medications Current Facility-Administered Medications  Medication Dose Route Frequency Provider Last Rate Last Dose  . [EXPIRED] 0.9 %  sodium chloride infusion  1 mL/kg/hr Intravenous Continuous Marykay Lex, MD      . 0.9 %  sodium chloride infusion   Intravenous Continuous Runell Gess, MD 10 mL/hr at 04/17/12 2009    . acetaminophen (TYLENOL) tablet 650 mg  650 mg Oral Q6H PRN Catarina Hartshorn, MD       Or  . acetaminophen (TYLENOL) suppository 650 mg  650 mg Rectal Q6H PRN Catarina Hartshorn, MD      . acetaminophen (TYLENOL) tablet 650 mg  650 mg Oral Q4H PRN Abelino Derrick, PA      . ALPRAZolam Prudy Feeler) tablet 0.25 mg  0.25 mg Oral TID PRN Abelino Derrick, PA      . aspirin EC tablet 325 mg  325 mg Oral Daily Catarina Hartshorn, MD   325 mg at 04/17/12 1016  . atorvastatin (LIPITOR) tablet 80 mg  80 mg Oral q1800 Marykay Lex, MD   80 mg at 04/17/12 1802  . [COMPLETED] fentaNYL (SUBLIMAZE) 0.05 MG/ML injection           . [COMPLETED] furosemide (LASIX) injection 40 mg  40 mg Intravenous Once Marykay Lex, MD   40 mg at 04/17/12 1356  . [COMPLETED] heparin 1000 UNIT/ML injection           . [COMPLETED] heparin 2-0.9 UNIT/ML-% infusion           . heparin ADULT infusion 100 units/mL (25000 units/250 mL)  1,100 Units/hr Intravenous Continuous Lucia Gaskins, PHARMD 11 mL/hr at 04/17/12 2009 1,100 Units/hr at 04/17/12 2009  . [COMPLETED] lidocaine (XYLOCAINE) 1 % injection           .  [COMPLETED] midazolam (VERSED) 2 MG/2ML injection           . morphine 2 MG/ML injection 2 mg  2 mg Intravenous Q1H PRN Marykay Lex, MD      . morphine 4 MG/ML injection 4 mg  4 mg Intravenous Q4H PRN Catarina Hartshorn, MD      . multivitamin with minerals tablet 1 tablet  1 tablet Oral Daily Abelino Derrick, [place]   1 tablet at 04/17/12 1016  . nitroGLYCERIN (NITROSTAT) SL tablet 0.4 mg  0.4 mg Sublingual Q5 Min x 3 PRN Abelino Derrick, PA      . [COMPLETED] nitroGLYCERIN (NTG ON-CALL) 0.2 mg/mL injection           . nitroGLYCERIN 0.2 mg/mL in dextrose 5 % infusion  3 mcg/min Intravenous Titrated Abelino Derrick, PA 1.5 mL/hr at 04/18/12 0202 5 mcg/min at 04/18/12 0202  . ondansetron (ZOFRAN) tablet 4 mg  4 mg Oral Q6H PRN Catarina Hartshorn, MD       Or  . ondansetron [facility]) injection 4 mg  4 mg Intravenous Q6H PRN Catarina Hartshorn, MD      .  pantoprazole (PROTONIX) EC tablet 40 mg  40 mg Oral Q0600 Eda Paschal [place], [place]   40 mg at 04/17/12 9147  . polyethylene glycol (MIRALAX / GLYCOLAX) packet 17 g  17 g Oral Daily PRN Marykay Lex, MD   17 g at 04/17/12 2010  . polyethylene glycol (MIRALAX / GLYCOLAX) packet 17 g  17 g Oral Q1400 Kerin Perna, MD      . zolpidem [facility]) tablet 5 mg  5 mg Oral QHS PRN Abelino Derrick, PA      . [DISCONTINUED] 0.9 %  sodium chloride infusion  250 mL Intravenous PRN Marykay Lex, MD      . [DISCONTINUED] 0.9 %  sodium chloride infusion  1 mL/kg/hr Intravenous Continuous Marykay Lex, MD 80 mL/hr at 04/17/12 1017 1.008 mL/kg/hr at 04/17/12 1017  . [DISCONTINUED] aspirin chewable tablet 324 mg  324 mg Oral Pre-Cath Marykay Lex, MD      . [DISCONTINUED] atorvastatin (LIPITOR) tablet 40 mg  40 mg Oral q1800 Wilburt Finlay, [place]      . [DISCONTINUED] heparin ADULT infusion 100 units/mL (25000 units/250 mL)  1,100 Units/hr Intravenous Continuous Cristal Ford, MD   1,100 Units/hr at 04/17/12 0710  . [DISCONTINUED] omega-3 acid ethyl esters (LOVAZA) capsule 1 g  1 g Oral Daily Eda Paschal  [place], [place]   1 g at 04/17/12 1016  . [DISCONTINUED] sodium chloride 0.9 % injection 3 mL  3 mL Intravenous Q12H Catarina Hartshorn, MD   3 mL at 04/17/12 1017  . [DISCONTINUED] sodium chloride 0.9 % injection 3 mL  3 mL Intravenous Q12H Marykay Lex, MD      . [DISCONTINUED] sodium chloride 0.9 % injection 3 mL  3 mL Intravenous PRN Marykay Lex, MD        PE: General appearance: alert, cooperative and no distress Lungs: clear to auscultation bilaterally Heart: regular rate and rhythm, S1, S2 normal, no murmur, click, rub or gallop Extremities: No LEE Pulses: 2+ and symmetric Skin: warm and dry Neurologic: Grossly normal  Lab Results:   Basename 04/18/12 0230 04/16/12 0756  WBC 6.2 3.6*  HGB 13.9 14.4  HCT 42.0 42.8  PLT 124* 137*   BMET  Basename 04/16/12 0756  NA 137  K 3.5  CL 100  CO2 28  GLUCOSE 126*  BUN 17  CREATININE 1.14  CALCIUM 9.6   PT/INR  Basename 04/17/12 0916  LABPROT 13.6  INR 1.05   Cholesterol  Basename 04/16/12 1259  CHOL 266*   Lipid Panel     Component Value Date/Time   CHOL 266* 04/16/2012 1259   TRIG 113 04/16/2012 1259   HDL 60 04/16/2012 1259   CHOLHDL 4.4 04/16/2012 1259   VLDL 23 04/16/2012 1259   LDLCALC 183* 04/16/2012 1259     Cardiac Enzymes No components found with this basename: TROPONIN:3, CKMB:3  Studies/Results: Hemodynamics:  Central Aortic / Mean Pressures: 125/65 mmHg; 92 mmHg  Left Ventricular Pressures / EDP: 125/11 mmHg; 25-28 mmHg Left Ventriculography:  EF: 55 %  Wall Motion: mild basal inferior hypokinesis Coronary Anatomy: See written note scanned in chart.  Left Main: Large caliber vessel that bifurcates normally into LAD and Circumflex LAD: Large caliber vessel with tandem ~70-80% lesions in the proximal vessel prior to a large Septal Perforator & First Diagonal (D1) followed by at 1-2 additional focal lesions involving the LAD-D1 bifurcation. Beyond D2, the vessel grows to a Large caliber vessel that wraps  the apex  with mild luminal irregularities.  D1 & 2: Small to moderate caliber branches that are both relatively proximal vessels perfusing the basal-mid anterolateral wall; D1 is partially involved in the parent LAD lesion, but with <50% stenosis, otherwise both vessels are angiographically normal. Left Circumflex: ~90 degree takeoff with a short proximal stem before bifurcating into moderate to large caliber OM1 and moderate caliber AV-Groove Circumflex (AVG Cx), ~50-60% stenosis pre-bifurcation with ~60-70% proximal OM1 and ~60% proximal AVG Cx. The AVG Cx gives off a small OM2 then terminates as two small caliber LPL branches with a ~50% stenosis  OM1: Moderate to large caliber vessel with minimal luminal irregularities after the proximal lesion.  OM 2: small caliber, mild luminal irregularities RCA: Large caliber dominant vessel, ~30-40% stenosis just  RPDA: Fills via R-R & LAD Septal collaterals  RPL Sysytem:The Right Posterior AV Groove Branch Fills via retrograde flow from PDA collaterals as well as some mild LCx-PL collaterals PATIENT DISPOSITION:  The patient was transferred to the PACU holding area in a hemodynamicaly stable, chest pain free condition.  The patient tolerated the procedure well, and there were no complications.  The patient was stable before, during, and after the procedure. POST-OPERATIVE DIAGNOSIS:  Multivessel CAD with mid 80% to 100% distal RCA occlusion and tandem 70-80% proximal to mid LAD stenoses along with moderate to severe ~60-70% bifurcation disease in Circumflex-OM1.  Well preserved EF with elevated LVEDP. PLAN OF CARE:  Post cath care -- gentle diuresis  Restart Heparin post TR band removal (6hrs)  CT Surgery consult for CABG consideration; patient has not been on a Thienopyridine.  Add statin HARDING,DAVID W, M.D., M.S.  [facility]  Assessment/Plan    Principal Problem:  *NSTEMI (non-ST elevated myocardial  infarction) Active Problems:  Family history of coronary artery disease  Bradycardia, asymptomatic  Hyperlipidemia  CAD, multiple vessel  Plan:  Multivessel CAD with mid 80% to 100% distal RCA occlusion and tandem 70-80% proximal to mid LAD stenoses along with moderate to severe ~60-70% bifurcation disease in Circumflex-OM1.  EF 55%.  CTS planning CABG for 12/6.  May need to consider decreasing lipitor due to prior history of myalgia and elevated CK.   LOS: 2 days    HAGER, BRYAN 04/18/2012 8:06 AM   Patient seen and examined. Agree with assessment and plan. No recurrent chest pain or sob. Trop positive for NSTEMI. Multivessel CAD for CABG tomorrow.  Resting puilse is 90; add low dose beta blocker.  Pt apparently had issues with lipitor in past; will change to crestor 20 mg. Continue IV NTG and heparin.   Lennette Bihari, MD, [facility] 04/18/2012 8:22 AM

## 2012-04-18 NOTE — Progress Notes (Signed)
*  PRELIMINARY RESULTS* Echocardiogram 2D Echocardiogram has been performed.  Tyler Fox 04/18/2012, 10:44 AM

## 2012-04-18 NOTE — Progress Notes (Signed)
Cardiac surgery book and Incentive spirometer given to patient. Pt states he will watch video in a bit.

## 2012-04-18 NOTE — Progress Notes (Signed)
Pt got up to urinate and got a little dizzy; orthostatic vitals checked at pts request. VS were reported to PA; adjustments made to nitroglycerin as ordered. Instructed pt not to get up by himself and if he would be able to use his urinal while sitting for now. Pt agreeable. Will continue to monitor pt.

## 2012-04-18 NOTE — Progress Notes (Signed)
ANTICOAGULATION CONSULT NOTE  Pharmacy Consult for  Heparin Indication:  CAD  No Known Allergies  Patient Measurements: Height:  5'11'' Weight:  175 pounds Heparin dosing weight:  79kg  Vital Signs: Temp: 98.5 F (36.9 C) (12/04 2337) Temp src: Oral (12/04 2337) BP: 110/68 mmHg (12/05 0130) Pulse Rate: 65  (12/04 2000)  Labs:  Basename 04/18/12 0230 04/17/12 0916 04/17/12 0540 04/16/12 2248 04/16/12 1844 04/16/12 1711 04/16/12 0756  HGB 13.9 -- -- -- -- -- 14.4  HCT 42.0 -- -- -- -- -- 42.8  PLT 124* -- -- -- -- -- 137*  APTT -- -- -- -- -- -- --  LABPROT -- 13.6 -- -- -- -- --  INR -- 1.05 -- -- -- -- --  HEPARINUNFRC 0.32 -- 0.83* -- 0.13* -- --  CREATININE -- -- -- -- -- -- 1.14  CKTOTAL -- -- -- -- -- -- --  CKMB -- -- -- -- -- -- --  TROPONINI -- -- 4.06* 4.11* -- 2.05* --    Assessment: 66 yo male with CAD, awaiting CABG, for Heparin  Goal of Therapy:  Heparin level 0.3-0.7 units/ml Monitor platelets by anticoagulation protocol: Yes   Plan:  Continue Heparin at current rate   Geannie Risen, PharmD, BCPS  04/18/2012,4:13 AM

## 2012-04-18 NOTE — Progress Notes (Signed)
PFT completed. Unconfirmed results placed in Shadow Chart. 

## 2012-04-18 NOTE — Progress Notes (Signed)
1 Day Post-Op Procedure(s) (LRB): LEFT HEART CATHETERIZATION WITH CORONARY ANGIOGRAM (N/A) Subjective: 66 year old nondiabetic with none stemi , unstable angina, stable on IV heparin and nitroglycerin Cardiac enzymes now on decline, hemodynamic stable, no arrhythmias 2-D echocardiogram performed today shows normal LV function, no significant valvular disease, images reviewed personally Head CT scan performed late yesterday shows no abnormalities associated with transient visual symptoms of bright light in the periphery of left visual field Objective: Vital signs in last 24 hours: Temp:  [98.2 F (36.8 C)-98.6 F (37 C)] 98.4 F (36.9 C) (12/05 1125) Pulse Rate:  [65-80] 80  (12/05 1125) Cardiac Rhythm:  [-] Normal sinus rhythm (12/05 0800) Resp:  [17-20] 18  (12/04 2000) BP: (93-151)/(49-78) 140/64 mmHg (12/05 1125) SpO2:  [95 %-100 %] 100 % (12/05 1125)  Hemodynamic parameters for last 24 hours:   normal sinus rhythm  Intake/Output from previous day: 12/04 0701 - 12/05 0700 In: 985.4 [P.O.:120; I.V.:865.4] Out: 1250 [Urine:1250] Intake/Output this shift: Total I/O In: 330 [P.O.:240; I.V.:90] Out: 501 [Urine:500; Stool:1]  Lungs clear No hematoma  Lab Results:  Basename 04/18/12 0230 04/16/12 0756  WBC 6.2 3.6*  HGB 13.9 14.4  HCT 42.0 42.8  PLT 124* 137*   BMET:  Basename 04/16/12 0756  NA 137  K 3.5  CL 100  CO2 28  GLUCOSE 126*  BUN 17  CREATININE 1.14  CALCIUM 9.6    PT/INR:  Basename 04/17/12 0916  LABPROT 13.6  INR 1.05   ABG No results found for this basename: phart, pco2, po2, hco3, tco2, acidbasedef, o2sat   CBG (last 3)  No results found for this basename: GLUCAP:3 in the last 72 hours  Assessment/Plan: S/P Procedure(s) (LRB): LEFT HEART CATHETERIZATION WITH CORONARY ANGIOGRAM (N/A) Plan multivessel bypass grafting tomorrow Procedure benefits and risks discussed with patient who understands and agrees to proceed with surgery   LOS: 2  days    VAN TRIGT III,Giani Winther 04/18/2012

## 2012-04-19 ENCOUNTER — Encounter (HOSPITAL_COMMUNITY): Payer: Self-pay | Admitting: Anesthesiology

## 2012-04-19 ENCOUNTER — Encounter (HOSPITAL_COMMUNITY): Payer: Self-pay | Admitting: *Deleted

## 2012-04-19 ENCOUNTER — Encounter (HOSPITAL_COMMUNITY)
Admission: EM | Disposition: A | Discharge status: Home / Self Care | Payer: Self-pay | Source: Home / Self Care | Attending: Cardiothoracic Surgery

## 2012-04-19 ENCOUNTER — Inpatient Hospital Stay (HOSPITAL_COMMUNITY): Payer: BC Managed Care – PPO | Admitting: Anesthesiology

## 2012-04-19 ENCOUNTER — Inpatient Hospital Stay (HOSPITAL_COMMUNITY): Payer: BC Managed Care – PPO

## 2012-04-19 DIAGNOSIS — I2510 Atherosclerotic heart disease of native coronary artery without angina pectoris: Secondary | ICD-10-CM

## 2012-04-19 HISTORY — PX: CORONARY ARTERY BYPASS GRAFT: SHX141

## 2012-04-19 HISTORY — PX: INTRAOPERATIVE TRANSESOPHAGEAL ECHOCARDIOGRAM: SHX5062

## 2012-04-19 LAB — CBC
HCT: 41.9 % (ref 39.0–52.0)
HCT: 27.3 % — ABNORMAL LOW (ref 39.0–52.0)
Hemoglobin: 14.4 g/dL (ref 13.0–17.0)
Hemoglobin: 9.4 g/dL — ABNORMAL LOW (ref 13.0–17.0)
MCH: 27.6 pg (ref 26.0–34.0)
MCHC: 34.4 g/dL (ref 30.0–36.0)
MCHC: 34.4 g/dL (ref 30.0–36.0)
MCV: 80.9 fL (ref 78.0–100.0)
MCV: 80.1 fL (ref 78.0–100.0)
MCV: 80.3 fL (ref 78.0–100.0)
Platelets: 91 10*3/uL — ABNORMAL LOW (ref 150–400)
Platelets: 100 10*3/uL — ABNORMAL LOW (ref 150–400)
RBC: 3.67 MIL/uL — ABNORMAL LOW (ref 4.22–5.81)
RBC: 3.4 MIL/uL — ABNORMAL LOW (ref 4.22–5.81)
RDW: 13 % (ref 11.5–15.5)
WBC: 5.2 10*3/uL (ref 4.0–10.5)
WBC: 7.8 10*3/uL (ref 4.0–10.5)
WBC: 7.9 10*3/uL (ref 4.0–10.5)

## 2012-04-19 LAB — POCT I-STAT 4, (NA,K, GLUC, HGB,HCT)
Glucose, Bld: 99 mg/dL (ref 70–99)
Glucose, Bld: 105 mg/dL — ABNORMAL HIGH (ref 70–99)
Glucose, Bld: 116 mg/dL — ABNORMAL HIGH (ref 70–99)
Glucose, Bld: 118 mg/dL — ABNORMAL HIGH (ref 70–99)
HCT: 38 % — ABNORMAL LOW (ref 39.0–52.0)
HCT: 30 % — ABNORMAL LOW (ref 39.0–52.0)
HCT: 31 % — ABNORMAL LOW (ref 39.0–52.0)
HCT: 31 % — ABNORMAL LOW (ref 39.0–52.0)
Hemoglobin: 12.9 g/dL — ABNORMAL LOW (ref 13.0–17.0)
Hemoglobin: 10.2 g/dL — ABNORMAL LOW (ref 13.0–17.0)
Hemoglobin: 10.5 g/dL — ABNORMAL LOW (ref 13.0–17.0)
Hemoglobin: 10.5 g/dL — ABNORMAL LOW (ref 13.0–17.0)
Potassium: 3.3 mEq/L — ABNORMAL LOW (ref 3.5–5.1)
Potassium: 3.5 mEq/L (ref 3.5–5.1)
Potassium: 4.1 mEq/L (ref 3.5–5.1)
Potassium: 4.3 mEq/L (ref 3.5–5.1)
Sodium: 138 mEq/L (ref 135–145)
Sodium: 142 mEq/L (ref 135–145)

## 2012-04-19 LAB — BASIC METABOLIC PANEL
Chloride: 101 mEq/L (ref 96–112)
GFR calc Af Amer: 79 mL/min — ABNORMAL LOW (ref >90)
GFR calc non Af Amer: 69 mL/min — ABNORMAL LOW (ref >90)
Potassium: 3.2 mEq/L — ABNORMAL LOW (ref 3.5–5.1)
Sodium: 139 mEq/L (ref 135–145)

## 2012-04-19 LAB — POCT I-STAT 3, ART BLOOD GAS (G3+)
Acid-Base Excess: 5 mmol/L — ABNORMAL HIGH (ref 0.0–2.0)
Acid-base deficit: 2 mmol/L (ref 0.0–2.0)
Acid-base deficit: 1 mmol/L (ref 0.0–2.0)
Bicarbonate: 29.7 mEq/L — ABNORMAL HIGH (ref 20.0–24.0)
Bicarbonate: 25.1 mEq/L — ABNORMAL HIGH (ref 20.0–24.0)
O2 Saturation: 99 %
O2 Saturation: 97 %
TCO2: 26 mmol/L (ref 0–100)
pCO2 arterial: 39.1 mmHg (ref 35.0–45.0)
pCO2 arterial: 30.5 mmHg — ABNORMAL LOW (ref 35.0–45.0)
pCO2 arterial: 43.1 mmHg (ref 35.0–45.0)
pCO2 arterial: 48.1 mmHg — ABNORMAL HIGH (ref 35.0–45.0)
pH, Arterial: 7.446 (ref 7.350–7.450)
pH, Arterial: 7.417 (ref 7.350–7.450)
pH, Arterial: 7.493 — ABNORMAL HIGH (ref 7.350–7.450)
pO2, Arterial: 219 mmHg — ABNORMAL HIGH (ref 80.0–100.0)
pO2, Arterial: 120 mmHg — ABNORMAL HIGH (ref 80.0–100.0)

## 2012-04-19 LAB — HEMOGLOBIN AND HEMATOCRIT, BLOOD
HCT: 30 % — ABNORMAL LOW (ref 39.0–52.0)
Hemoglobin: 10.2 g/dL — ABNORMAL LOW (ref 13.0–17.0)

## 2012-04-19 LAB — SURGICAL PCR SCREEN: MRSA, PCR: NEGATIVE

## 2012-04-19 LAB — BLOOD GAS, ARTERIAL
Drawn by: 318431
pCO2 arterial: 35.9 mmHg (ref 35.0–45.0)
pH, Arterial: 7.457 — ABNORMAL HIGH (ref 7.350–7.450)

## 2012-04-19 LAB — PROTIME-INR: Prothrombin Time: 16.7 seconds — ABNORMAL HIGH (ref 11.6–15.2)

## 2012-04-19 LAB — GLUCOSE, CAPILLARY: Glucose-Capillary: 113 mg/dL — ABNORMAL HIGH (ref 70–99)

## 2012-04-19 LAB — PLATELET COUNT: Platelets: 89 10*3/uL — ABNORMAL LOW (ref 150–400)

## 2012-04-19 LAB — CREATININE, SERUM
Creatinine, Ser: 0.97 mg/dL (ref 0.50–1.35)
GFR calc Af Amer: >90 mL/min (ref >90)
GFR calc non Af Amer: 83 mL/min — ABNORMAL LOW (ref >90)

## 2012-04-19 LAB — APTT: aPTT: 32 seconds (ref 24–37)

## 2012-04-19 LAB — MAGNESIUM: Magnesium: 3.3 mg/dL — ABNORMAL HIGH (ref 1.5–2.5)

## 2012-04-19 LAB — HEPARIN LEVEL (UNFRACTIONATED): Heparin Unfractionated: 1.04 IU/mL — ABNORMAL HIGH (ref 0.30–0.70)

## 2012-04-19 SURGERY — CORONARY ARTERY BYPASS GRAFTING (CABG)
Anesthesia: General | Site: Chest | Wound class: Clean

## 2012-04-19 MED ORDER — HYDROMORPHONE HCL PF 1 MG/ML IJ SOLN: 0.2500 mg | INTRAMUSCULAR | Status: DC | PRN

## 2012-04-19 MED ORDER — LIDOCAINE HCL (CARDIAC) 20 MG/ML IV SOLN
INTRAVENOUS | Status: DC | PRN
  Administered 2012-04-19: 70 mg via INTRAVENOUS

## 2012-04-19 MED ORDER — PANTOPRAZOLE SODIUM 40 MG PO TBEC: 40.0000 mg | DELAYED_RELEASE_TABLET | Freq: Every day | ORAL | Status: DC

## 2012-04-19 MED ORDER — ROCURONIUM BROMIDE 100 MG/10ML IV SOLN
INTRAVENOUS | Status: DC | PRN
  Administered 2012-04-19: 50 mg via INTRAVENOUS

## 2012-04-19 MED ORDER — VANCOMYCIN HCL IN DEXTROSE 1-5 GM/200ML-% IV SOLN
1000.0000 mg | Freq: Once | INTRAVENOUS | Status: DC
  Filled 2012-04-19: qty 200

## 2012-04-19 MED ORDER — 0.9 % SODIUM CHLORIDE (POUR BTL) OPTIME
TOPICAL | Status: DC | PRN
  Administered 2012-04-19: 2000 mL

## 2012-04-19 MED ORDER — SODIUM CHLORIDE 0.9 % IJ SOLN: 3.0000 mL | INTRAMUSCULAR | Status: DC | PRN

## 2012-04-19 MED ORDER — METOPROLOL TARTRATE 25 MG/10 ML ORAL SUSPENSION
12.5000 mg | Freq: Two times a day (BID) | ORAL | Status: DC
  Filled 2012-04-19 (×5): qty 5

## 2012-04-19 MED ORDER — PLASMA-LYTE 148 IV SOLN
INTRAVENOUS | Status: AC
  Administered 2012-04-19: 10:00:00
  Filled 2012-04-19: qty 2.5

## 2012-04-19 MED ORDER — MORPHINE SULFATE 2 MG/ML IJ SOLN
2.0000 mg | INTRAMUSCULAR | Status: DC | PRN
  Administered 2012-04-19: 2 mg via INTRAVENOUS

## 2012-04-19 MED ORDER — SODIUM CHLORIDE 0.9 % IJ SOLN
3.0000 mL | Freq: Two times a day (BID) | INTRAMUSCULAR | Status: DC
  Administered 2012-04-20: 3 mL via INTRAVENOUS

## 2012-04-19 MED ORDER — METOPROLOL TARTRATE 12.5 MG HALF TABLET
12.5000 mg | ORAL_TABLET | Freq: Two times a day (BID) | ORAL | Status: DC
  Administered 2012-04-20: 12.5 mg via ORAL
  Filled 2012-04-19 (×5): qty 1

## 2012-04-19 MED ORDER — INSULIN ASPART 100 UNIT/ML ~~LOC~~ SOLN
0.0000 [IU] | SUBCUTANEOUS | Status: DC
  Administered 2012-04-20: 2 [IU] via SUBCUTANEOUS
  Administered 2012-04-20: 12:00:00 via SUBCUTANEOUS
  Administered 2012-04-20: 2 [IU] via SUBCUTANEOUS
  Administered 2012-04-20: 4 [IU] via SUBCUTANEOUS
  Administered 2012-04-21: 2 [IU] via SUBCUTANEOUS

## 2012-04-19 MED ORDER — MAGNESIUM SULFATE 40 MG/ML IJ SOLN
4.0000 g | Freq: Once | INTRAMUSCULAR | Status: AC
  Administered 2012-04-19: 4 g via INTRAVENOUS
  Filled 2012-04-19: qty 100

## 2012-04-19 MED ORDER — POTASSIUM CHLORIDE 10 MEQ/50ML IV SOLN
10.0000 meq | Freq: Once | INTRAVENOUS | Status: AC
  Administered 2012-04-19: 10 meq via INTRAVENOUS

## 2012-04-19 MED ORDER — BISACODYL 10 MG RE SUPP: 10.0000 mg | Freq: Every day | RECTAL | Status: DC

## 2012-04-19 MED ORDER — PHENYLEPHRINE HCL 10 MG/ML IJ SOLN
0.0000 ug/min | INTRAVENOUS | Status: DC
  Filled 2012-04-19: qty 2

## 2012-04-19 MED ORDER — ACETAMINOPHEN 10 MG/ML IV SOLN
1000.0000 mg | Freq: Once | INTRAVENOUS | Status: AC
  Administered 2012-04-19: 1000 mg via INTRAVENOUS
  Filled 2012-04-19: qty 100

## 2012-04-19 MED ORDER — POTASSIUM CHLORIDE 10 MEQ/50ML IV SOLN
10.0000 meq | INTRAVENOUS | Status: AC
  Administered 2012-04-19 (×3): 10 meq via INTRAVENOUS

## 2012-04-19 MED ORDER — OXYCODONE HCL 5 MG PO TABS: 5.0000 mg | ORAL_TABLET | Freq: Once | ORAL | Status: DC | PRN

## 2012-04-19 MED ORDER — LACTATED RINGERS IV SOLN: INTRAVENOUS | Status: DC

## 2012-04-19 MED ORDER — ALBUMIN HUMAN 5 % IV SOLN
250.0000 mL | INTRAVENOUS | Status: AC | PRN
Start: 2012-04-19 — End: 2012-04-20
  Administered 2012-04-19 (×3): 250 mL via INTRAVENOUS
  Filled 2012-04-19: qty 500

## 2012-04-19 MED ORDER — LACTATED RINGERS IV SOLN
INTRAVENOUS | Status: DC | PRN
  Administered 2012-04-19: 07:00:00 via INTRAVENOUS

## 2012-04-19 MED ORDER — VECURONIUM BROMIDE 10 MG IV SOLR
INTRAVENOUS | Status: DC | PRN
  Administered 2012-04-19 (×2): 10 mg via INTRAVENOUS
  Administered 2012-04-19 (×2): 5 mg via INTRAVENOUS

## 2012-04-19 MED ORDER — DEXTROSE 5 % IV SOLN
1.5000 g | Freq: Two times a day (BID) | INTRAVENOUS | Status: DC
  Administered 2012-04-19 – 2012-04-20 (×3): 1.5 g via INTRAVENOUS
  Filled 2012-04-19 (×4): qty 1.5

## 2012-04-19 MED ORDER — INSULIN ASPART 100 UNIT/ML ~~LOC~~ SOLN
0.0000 [IU] | SUBCUTANEOUS | Status: AC
  Administered 2012-04-19: 2 [IU] via SUBCUTANEOUS

## 2012-04-19 MED ORDER — FAMOTIDINE IN NACL 20-0.9 MG/50ML-% IV SOLN
20.0000 mg | Freq: Two times a day (BID) | INTRAVENOUS | Status: DC
  Administered 2012-04-19: 20 mg via INTRAVENOUS

## 2012-04-19 MED ORDER — FENTANYL CITRATE 0.05 MG/ML IJ SOLN
INTRAMUSCULAR | Status: DC | PRN
  Administered 2012-04-19: 550 ug via INTRAVENOUS
  Administered 2012-04-19: 150 ug via INTRAVENOUS
  Administered 2012-04-19: 250 ug via INTRAVENOUS
  Administered 2012-04-19: 150 ug via INTRAVENOUS
  Administered 2012-04-19: 100 ug via INTRAVENOUS
  Administered 2012-04-19 (×2): 50 ug via INTRAVENOUS
  Administered 2012-04-19 (×2): 100 ug via INTRAVENOUS
  Administered 2012-04-19: 250 ug via INTRAVENOUS

## 2012-04-19 MED ORDER — MIDAZOLAM HCL 2 MG/2ML IJ SOLN: 2.0000 mg | INTRAMUSCULAR | Status: DC | PRN

## 2012-04-19 MED ORDER — DOPAMINE-DEXTROSE 3.2-5 MG/ML-% IV SOLN: 2.0000 ug/kg/min | INTRAVENOUS | Status: DC

## 2012-04-19 MED ORDER — METOPROLOL TARTRATE 1 MG/ML IV SOLN: 2.5000 mg | INTRAVENOUS | Status: DC | PRN

## 2012-04-19 MED ORDER — ONDANSETRON HCL 4 MG/2ML IJ SOLN: 4.0000 mg | Freq: Four times a day (QID) | INTRAMUSCULAR | Status: DC | PRN

## 2012-04-19 MED ORDER — SODIUM CHLORIDE 0.9 % IV SOLN
INTRAVENOUS | Status: DC
  Filled 2012-04-19: qty 1

## 2012-04-19 MED ORDER — DOCUSATE SODIUM 100 MG PO CAPS
200.0000 mg | ORAL_CAPSULE | Freq: Every day | ORAL | Status: DC
  Administered 2012-04-20: 200 mg via ORAL
  Filled 2012-04-19: qty 2

## 2012-04-19 MED ORDER — SODIUM CHLORIDE 0.9 % IV SOLN: 250.0000 mL | INTRAVENOUS | Status: DC

## 2012-04-19 MED ORDER — MORPHINE SULFATE 2 MG/ML IJ SOLN
1.0000 mg | INTRAMUSCULAR | Status: AC | PRN
  Filled 2012-04-19: qty 1

## 2012-04-19 MED ORDER — INSULIN REGULAR BOLUS VIA INFUSION
0.0000 [IU] | Freq: Three times a day (TID) | INTRAVENOUS | Status: DC
  Filled 2012-04-19: qty 10

## 2012-04-19 MED ORDER — ARTIFICIAL TEARS OP OINT
TOPICAL_OINTMENT | OPHTHALMIC | Status: DC | PRN
  Administered 2012-04-19: 1 via OPHTHALMIC

## 2012-04-19 MED ORDER — ALBUMIN HUMAN 5 % IV SOLN
INTRAVENOUS | Status: DC | PRN
  Administered 2012-04-19: 13:00:00 via INTRAVENOUS

## 2012-04-19 MED ORDER — GLYCOPYRROLATE 0.2 MG/ML IJ SOLN
INTRAMUSCULAR | Status: DC | PRN
  Administered 2012-04-19: 0.2 mg via INTRAVENOUS

## 2012-04-19 MED ORDER — MIDAZOLAM HCL 5 MG/5ML IJ SOLN
INTRAMUSCULAR | Status: DC | PRN
  Administered 2012-04-19 (×2): 4 mg via INTRAVENOUS
  Administered 2012-04-19: 2 mg via INTRAVENOUS
  Administered 2012-04-19 (×2): 1 mg via INTRAVENOUS
  Administered 2012-04-19: 3 mg via INTRAVENOUS
  Administered 2012-04-19 (×3): 1 mg via INTRAVENOUS
  Administered 2012-04-19: 2 mg via INTRAVENOUS

## 2012-04-19 MED ORDER — SODIUM CHLORIDE 0.9 % IV SOLN
INTRAVENOUS | Status: DC
  Administered 2012-04-20: 17:00:00 via INTRAVENOUS

## 2012-04-19 MED ORDER — PROPOFOL 10 MG/ML IV BOLUS
INTRAVENOUS | Status: DC | PRN
  Administered 2012-04-19: 60 mg via INTRAVENOUS

## 2012-04-19 MED ORDER — SODIUM CHLORIDE 0.9 % IV SOLN
INTRAVENOUS | Status: AC
  Administered 2012-04-19: 11:00:00 via INTRAVENOUS
  Filled 2012-04-19: qty 40

## 2012-04-19 MED ORDER — LACTATED RINGERS IV SOLN: 500.0000 mL | Freq: Once | INTRAVENOUS | Status: AC | PRN

## 2012-04-19 MED ORDER — LACTATED RINGERS IV SOLN
INTRAVENOUS | Status: DC | PRN
  Administered 2012-04-19: 06:00:00 via INTRAVENOUS

## 2012-04-19 MED ORDER — ASPIRIN EC 325 MG PO TBEC
325.0000 mg | DELAYED_RELEASE_TABLET | Freq: Every day | ORAL | Status: DC
  Administered 2012-04-20: 325 mg via ORAL
  Filled 2012-04-19 (×2): qty 1

## 2012-04-19 MED ORDER — VANCOMYCIN HCL IN DEXTROSE 1-5 GM/200ML-% IV SOLN
1000.0000 mg | Freq: Two times a day (BID) | INTRAVENOUS | Status: DC
  Administered 2012-04-19 – 2012-04-20 (×3): 1000 mg via INTRAVENOUS
  Filled 2012-04-19 (×4): qty 200

## 2012-04-19 MED ORDER — HEPARIN SODIUM (PORCINE) 1000 UNIT/ML IJ SOLN
INTRAMUSCULAR | Status: DC | PRN
  Administered 2012-04-19: 21000 [IU] via INTRAVENOUS
  Administered 2012-04-19: 5000 [IU] via INTRAVENOUS

## 2012-04-19 MED ORDER — PHENYLEPHRINE HCL 10 MG/ML IJ SOLN
INTRAMUSCULAR | Status: DC | PRN
  Administered 2012-04-19 (×5): 40 ug via INTRAVENOUS

## 2012-04-19 MED ORDER — ASPIRIN 81 MG PO CHEW: 324.0000 mg | CHEWABLE_TABLET | Freq: Every day | ORAL | Status: DC

## 2012-04-19 MED ORDER — DEXMEDETOMIDINE HCL IN NACL 200 MCG/50ML IV SOLN: 0.1000 ug/kg/h | INTRAVENOUS | Status: DC

## 2012-04-19 MED ORDER — PROMETHAZINE HCL 25 MG/ML IJ SOLN: 6.2500 mg | INTRAMUSCULAR | Status: DC | PRN

## 2012-04-19 MED ORDER — PROTAMINE SULFATE 10 MG/ML IV SOLN
INTRAVENOUS | Status: DC | PRN
  Administered 2012-04-19: 115 mg via INTRAVENOUS
  Administered 2012-04-19: 10 mg via INTRAVENOUS
  Administered 2012-04-19: 125 mg via INTRAVENOUS

## 2012-04-19 MED ORDER — MEPERIDINE HCL 50 MG/ML IJ SOLN: 6.2500 mg | INTRAMUSCULAR | Status: DC | PRN

## 2012-04-19 MED ORDER — NITROGLYCERIN IN D5W 200-5 MCG/ML-% IV SOLN: 0.0000 ug/min | INTRAVENOUS | Status: DC

## 2012-04-19 MED ORDER — OXYCODONE HCL 5 MG PO TABS
5.0000 mg | ORAL_TABLET | ORAL | Status: DC | PRN
  Administered 2012-04-19: 5 mg via ORAL
  Administered 2012-04-20 (×3): 10 mg via ORAL
  Filled 2012-04-19: qty 2
  Filled 2012-04-19: qty 1
  Filled 2012-04-19 (×2): qty 2

## 2012-04-19 MED ORDER — BISACODYL 5 MG PO TBEC
10.0000 mg | DELAYED_RELEASE_TABLET | Freq: Every day | ORAL | Status: DC
  Administered 2012-04-20: 10 mg via ORAL
  Filled 2012-04-19: qty 2

## 2012-04-19 MED ORDER — ACETAMINOPHEN 160 MG/5ML PO SOLN: 975.0000 mg | Freq: Four times a day (QID) | ORAL | Status: DC

## 2012-04-19 MED ORDER — METOCLOPRAMIDE HCL 5 MG/ML IJ SOLN
10.0000 mg | Freq: Four times a day (QID) | INTRAMUSCULAR | Status: AC
  Administered 2012-04-19 – 2012-04-20 (×4): 10 mg via INTRAVENOUS
  Filled 2012-04-19 (×4): qty 2

## 2012-04-19 MED ORDER — OXYCODONE HCL 5 MG/5ML PO SOLN: 5.0000 mg | Freq: Once | ORAL | Status: DC | PRN

## 2012-04-19 MED ORDER — ACETAMINOPHEN 500 MG PO TABS
1000.0000 mg | ORAL_TABLET | Freq: Four times a day (QID) | ORAL | Status: DC
  Administered 2012-04-20 – 2012-04-21 (×5): 1000 mg via ORAL
  Filled 2012-04-19 (×8): qty 2

## 2012-04-19 MED ORDER — HEMOSTATIC AGENTS (NO CHARGE) OPTIME
TOPICAL | Status: DC | PRN
  Administered 2012-04-19: 1 via TOPICAL

## 2012-04-19 MED ORDER — SODIUM CHLORIDE 0.45 % IV SOLN
INTRAVENOUS | Status: DC
  Administered 2012-04-19: 18:00:00 via INTRAVENOUS

## 2012-04-19 SURGICAL SUPPLY — 113 items
ADAPTER CARDIO PERF ANTE/RETRO (ADAPTER) ×4 IMPLANT
ADPR PRFSN 84XANTGRD RTRGD (ADAPTER) ×2
ATTRACTOMAT 16X20 MAGNETIC DRP (DRAPES) ×4 IMPLANT
BAG DECANTER FOR FLEXI CONT (MISCELLANEOUS) ×4 IMPLANT
BANDAGE ELASTIC 4 VELCRO ST LF (GAUZE/BANDAGES/DRESSINGS) ×4 IMPLANT
BANDAGE ELASTIC 6 VELCRO ST LF (GAUZE/BANDAGES/DRESSINGS) ×4 IMPLANT
BANDAGE GAUZE ELAST BULKY 4 IN (GAUZE/BANDAGES/DRESSINGS) ×4 IMPLANT
BASKET HEART  (ORDER IN 25'S) (MISCELLANEOUS) ×1
BASKET HEART (ORDER IN 25'S) (MISCELLANEOUS) ×1
BASKET HEART (ORDER IN 25S) (MISCELLANEOUS) ×2 IMPLANT
BLADE STERNUM SYSTEM 6 (BLADE) ×4 IMPLANT
BLADE SURG 11 STRL SS (BLADE) ×2 IMPLANT
BLADE SURG 12 STRL SS (BLADE) ×4 IMPLANT
BLADE SURG ROTATE 9660 (MISCELLANEOUS) IMPLANT
CANISTER SUCTION 2500CC (MISCELLANEOUS) ×4 IMPLANT
CANNULA AORTIC HI-FLOW 6.5M20F (CANNULA) ×4 IMPLANT
CANNULA GUNDRY RCSP 15FR (MISCELLANEOUS) ×4 IMPLANT
CANNULA VENOUS MAL SGL STG 40 (MISCELLANEOUS) IMPLANT
CANNULA VESSEL W/WING W/VALVE (CANNULA) ×2 IMPLANT
CANNULA VESSEL W/WING WO/VALVE (CANNULA) IMPLANT
CANNULAE VENOUS MAL SGL STG 40 (MISCELLANEOUS)
CATH CPB KIT VANTRIGT (MISCELLANEOUS) ×4 IMPLANT
CATH ROBINSON RED A/P 18FR (CATHETERS) ×12 IMPLANT
CATH THORACIC 28FR (CATHETERS) IMPLANT
CATH THORACIC 28FR RT ANG (CATHETERS) IMPLANT
CATH THORACIC 36FR (CATHETERS) IMPLANT
CATH THORACIC 36FR RT ANG (CATHETERS) ×8 IMPLANT
CLIP FOGARTY SPRING 6M (CLIP) ×4 IMPLANT
CLIP TI WIDE RED SMALL 24 (CLIP) ×4 IMPLANT
CLOTH BEACON ORANGE TIMEOUT ST (SAFETY) ×4 IMPLANT
COVER SURGICAL LIGHT HANDLE (MISCELLANEOUS) ×4 IMPLANT
CRADLE DONUT ADULT HEAD (MISCELLANEOUS) ×4 IMPLANT
DRAIN CHANNEL 32F RND 10.7 FF (WOUND CARE) ×4 IMPLANT
DRAPE CARDIOVASCULAR INCISE (DRAPES) ×4
DRAPE SLUSH MACHINE 52X66 (DRAPES) IMPLANT
DRAPE SLUSH/WARMER DISC (DRAPES) ×2 IMPLANT
DRAPE SRG 135X102X78XABS (DRAPES) ×2 IMPLANT
DRSG COVADERM 4X14 (GAUZE/BANDAGES/DRESSINGS) ×4 IMPLANT
ELECT BLADE 4.0 EZ CLEAN MEGAD (MISCELLANEOUS) ×4
ELECT BLADE 6.5 EXT (BLADE) ×4 IMPLANT
ELECT CAUTERY BLADE 6.4 (BLADE) ×4 IMPLANT
ELECT REM PT RETURN 9FT ADLT (ELECTROSURGICAL) ×8
ELECTRODE BLDE 4.0 EZ CLN MEGD (MISCELLANEOUS) ×2 IMPLANT
ELECTRODE REM PT RTRN 9FT ADLT (ELECTROSURGICAL) ×4 IMPLANT
GLOVE BIO SURGEON STRL SZ 6 (GLOVE) ×8 IMPLANT
GLOVE BIO SURGEON STRL SZ7.5 (GLOVE) ×12 IMPLANT
GLOVE BIOGEL M 6.5 STRL (GLOVE) ×12 IMPLANT
GLOVE BIOGEL PI IND STRL 6 (GLOVE) IMPLANT
GLOVE BIOGEL PI IND STRL 6.5 (GLOVE) IMPLANT
GLOVE BIOGEL PI IND STRL 7.0 (GLOVE) IMPLANT
GLOVE BIOGEL PI INDICATOR 6 (GLOVE) ×2
GLOVE BIOGEL PI INDICATOR 6.5 (GLOVE) ×4
GLOVE BIOGEL PI INDICATOR 7.0 (GLOVE) ×2
GOWN STRL NON-REIN LRG LVL3 (GOWN DISPOSABLE) ×26 IMPLANT
HEMOSTAT POWDER SURGIFOAM 1G (HEMOSTASIS) ×14 IMPLANT
HEMOSTAT SURGICEL 2X14 (HEMOSTASIS) ×6 IMPLANT
INSERT FOGARTY XLG (MISCELLANEOUS) IMPLANT
KIT BASIN OR (CUSTOM PROCEDURE TRAY) ×4 IMPLANT
KIT ROOM TURNOVER OR (KITS) ×4 IMPLANT
KIT SUCTION CATH 14FR (SUCTIONS) ×4 IMPLANT
KIT VASOVIEW W/TROCAR VH 2000 (KITS) ×4 IMPLANT
LEAD PACING MYOCARDI (MISCELLANEOUS) ×4 IMPLANT
MARKER GRAFT CORONARY BYPASS (MISCELLANEOUS) ×14 IMPLANT
NS IRRIG 1000ML POUR BTL (IV SOLUTION) ×22 IMPLANT
PACK OPEN HEART (CUSTOM PROCEDURE TRAY) ×4 IMPLANT
PAD ARMBOARD 7.5X6 YLW CONV (MISCELLANEOUS) ×8 IMPLANT
PENCIL BUTTON HOLSTER BLD 10FT (ELECTRODE) ×4 IMPLANT
PUNCH AORTIC ROTATE 4.0MM (MISCELLANEOUS) ×2 IMPLANT
PUNCH AORTIC ROTATE 4.5MM 8IN (MISCELLANEOUS) ×2 IMPLANT
PUNCH AORTIC ROTATE 5MM 8IN (MISCELLANEOUS) IMPLANT
SOLUTION ANTI FOG 6CC (MISCELLANEOUS) ×2 IMPLANT
SPONGE GAUZE 4X4 12PLY (GAUZE/BANDAGES/DRESSINGS) ×8 IMPLANT
SURGIFLO W/THROMBIN 8M KIT (HEMOSTASIS) ×2 IMPLANT
SUT BONE WAX W31G (SUTURE) ×4 IMPLANT
SUT MNCRL AB 4-0 PS2 18 (SUTURE) IMPLANT
SUT PROLENE 3 0 SH DA (SUTURE) ×2 IMPLANT
SUT PROLENE 3 0 SH1 36 (SUTURE) IMPLANT
SUT PROLENE 4 0 RB 1 (SUTURE) ×4
SUT PROLENE 4 0 SH DA (SUTURE) ×4 IMPLANT
SUT PROLENE 4-0 RB1 .5 CRCL 36 (SUTURE) ×2 IMPLANT
SUT PROLENE 5 0 C 1 36 (SUTURE) IMPLANT
SUT PROLENE 6 0 C 1 30 (SUTURE) ×4 IMPLANT
SUT PROLENE 7 0 DA (SUTURE) IMPLANT
SUT PROLENE 7.0 RB 3 (SUTURE) ×12 IMPLANT
SUT PROLENE 8 0 BV175 6 (SUTURE) ×4 IMPLANT
SUT PROLENE BLUE 7 0 (SUTURE) ×12 IMPLANT
SUT PROLENE POLY MONO (SUTURE) ×14 IMPLANT
SUT SILK  1 MH (SUTURE)
SUT SILK 1 MH (SUTURE) IMPLANT
SUT SILK 2 0 SH CR/8 (SUTURE) IMPLANT
SUT SILK 3 0 SH CR/8 (SUTURE) IMPLANT
SUT STEEL 6MS V (SUTURE) ×8 IMPLANT
SUT STEEL STERNAL CCS#1 18IN (SUTURE) IMPLANT
SUT STEEL SZ 6 DBL 3X14 BALL (SUTURE) ×4 IMPLANT
SUT VIC AB 1 CTX 36 (SUTURE) ×8
SUT VIC AB 1 CTX36XBRD ANBCTR (SUTURE) ×4 IMPLANT
SUT VIC AB 2-0 CT1 27 (SUTURE) ×16
SUT VIC AB 2-0 CT1 TAPERPNT 27 (SUTURE) IMPLANT
SUT VIC AB 2-0 CTX 27 (SUTURE) ×8 IMPLANT
SUT VIC AB 3-0 SH 27 (SUTURE)
SUT VIC AB 3-0 SH 27X BRD (SUTURE) IMPLANT
SUT VIC AB 3-0 X1 27 (SUTURE) ×4 IMPLANT
SUT VICRYL 4-0 PS2 18IN ABS (SUTURE) ×4 IMPLANT
SUTURE E-PAK OPEN HEART (SUTURE) ×4 IMPLANT
SYSTEM SAHARA CHEST DRAIN ATS (WOUND CARE) ×4 IMPLANT
TAPE CLOTH SURG 4X10 WHT LF (GAUZE/BANDAGES/DRESSINGS) ×2 IMPLANT
TAPE PAPER 2X10 WHT MICROPORE (GAUZE/BANDAGES/DRESSINGS) ×2 IMPLANT
TOWEL OR 17X24 6PK STRL BLUE (TOWEL DISPOSABLE) ×8 IMPLANT
TOWEL OR 17X26 10 PK STRL BLUE (TOWEL DISPOSABLE) ×8 IMPLANT
TRAY FOLEY IC TEMP SENS 14FR (CATHETERS) ×4 IMPLANT
TUBING INSUFFLATION 10FT LAP (TUBING) ×4 IMPLANT
UNDERPAD 30X30 INCONTINENT (UNDERPADS AND DIAPERS) ×4 IMPLANT
WATER STERILE IRR 1000ML POUR (IV SOLUTION) ×8 IMPLANT

## 2012-04-19 NOTE — Progress Notes (Signed)
Patient ID: Tyler Fox, male   DOB: 09/06/1945, 66 y.o.   MRN: [id number]                   [address]            [name] [address]          [phone]     Day of Surgery Procedure(s) (LRB): CORONARY ARTERY BYPASS GRAFTING (CABG) (N/A) INTRAOPERATIVE TRANSESOPHAGEAL ECHOCARDIOGRAM (N/A)  Total Length of Stay:  LOS: 3 days  BP 87/60  Pulse 89  Temp 100.2 F (37.9 C) (Core (Comment))  Resp 19  Ht 5\' 11"  (1.803 m)  Wt 175 lb (79.379 kg)  BMI 24.41 kg/m2  SpO2 99%  .Intake/Output      12/05 0701 - 12/06 0700 12/06 0701 - 12/07 0700   P.O. 920    I.V. (mL/kg) 1071 (13.5) 2798.8 (35.3)   Blood  642   NG/GT  30   IV Piggyback  1454   Total Intake(mL/kg) 1991 (25.1) 4924.8 (62)   Urine (mL/kg/hr) 800 (0.4) 2225 (2.4)   Stool 1    Blood  750   Chest Tube  260   Total Output 801 3235   Net +1190 +1689.8        Stool Occurrence 1 x         . sodium chloride 20 mL/hr at 04/19/12 1800  . sodium chloride 20 mL/hr at 04/19/12 1400  . sodium chloride    . dexmedetomidine Stopped (04/19/12 1600)  . DOPamine 2 mcg/kg/min (04/19/12 1800)  . insulin (NOVOLIN-R) infusion Stopped (04/19/12 1600)  . lactated ringers 20 mL/hr at 04/19/12 1400  . nitroGLYCERIN Stopped (04/19/12 1500)  . phenylephrine (NEO-SYNEPHRINE) Adult infusion Stopped (04/19/12 1700)  . [DISCONTINUED] sodium chloride 10 mL/hr at 04/17/12 2009  . [DISCONTINUED] heparin Stopped (04/19/12 0545)  . [DISCONTINUED] lactated ringers 50 mL/hr (04/18/12 1501)  . [DISCONTINUED] nitroGLYCERIN Stopped (04/19/12 1340)     Lab Results  Component Value Date   WBC 7.8 04/19/2012   HGB 10.1* 04/19/2012   HCT 29.4* 04/19/2012   PLT 91* 04/19/2012   GLUCOSE 118* 04/19/2012   CHOL 266* 04/16/2012   TRIG 113 04/16/2012   HDL 60 04/16/2012   LDLCALC 183* 04/16/2012   NA 142 04/19/2012   K 3.5 04/19/2012   CL 101 04/19/2012   CREATININE 1.08 04/19/2012   BUN 12 04/19/2012   CO2 25 04/19/2012   INR 1.39  04/19/2012   HGBA1C 5.8* 04/17/2012    Stable post op Not bleeding Extubated  Delight Ovens MD  Beeper [phone] Office [phone] 04/19/2012 6:29 PM

## 2012-04-19 NOTE — Progress Notes (Signed)
The patient was examined and preop studies reviewed. There has been no change from the prior exam and the patient is ready for surgery.  Plan CABG on C [place] this am

## 2012-04-19 NOTE — Anesthesia Preprocedure Evaluation (Signed)
Anesthesia Evaluation  Patient identified by MRN, date of birth, ID band Patient awake    Reviewed: Allergy & Precautions, NPO status , Patient's Chart, lab work & pertinent test results  History of Anesthesia Complications Negative for: history of anesthetic complications  Airway Mallampati: II  Neck ROM: Full    Dental  (+) Teeth Intact   Pulmonary neg pulmonary ROS,  breath sounds clear to auscultation        Cardiovascular + CAD and + Past MI Rhythm:Regular Rate:Normal     Neuro/Psych negative neurological ROS     GI/Hepatic negative GI ROS, Neg liver ROS,   Endo/Other  negative endocrine ROS  Renal/GU      Musculoskeletal   Abdominal   Peds  Hematology negative hematology ROS (+)   Anesthesia Other Findings   Reproductive/Obstetrics                           Anesthesia Physical Anesthesia Plan  ASA: III  Anesthesia Plan: General   Post-op Pain Management:    Induction: Intravenous  Airway Management Planned: Oral ETT  Additional Equipment: Arterial line, PA Cath and TEE  Intra-op Plan:   Post-operative Plan: Extubation in OR  Informed Consent: I have reviewed the patients History and Physical, chart, labs and discussed the procedure including the risks, benefits and alternatives for the proposed anesthesia with the patient or authorized representative who has indicated his/her understanding and acceptance.   Dental advisory given  Plan Discussed with: CRNA and Surgeon  Anesthesia Plan Comments:         Anesthesia Quick Evaluation

## 2012-04-19 NOTE — Progress Notes (Signed)
Pt provided education regarding CABG. Reviewed use of IS and reviewed education materials. CHG pre surgical scrub completed. Pt is well informed of procedure.

## 2012-04-19 NOTE — Anesthesia Procedure Notes (Signed)
Procedure Name: Intubation Date/Time: 04/19/2012 7:59 AM Performed by: Gayla Medicus Pre-anesthesia Checklist: Patient identified, Timeout performed, Emergency Drugs available, Suction available and Patient being monitored Patient Re-evaluated:Patient Re-evaluated prior to inductionOxygen Delivery Method: Circle system utilized Preoxygenation: Pre-oxygenation with 100% oxygen Intubation Type: IV induction Ventilation: Mask ventilation without difficulty and Oral airway inserted - appropriate to patient size Laryngoscope Size: Mac and 3 Grade View: Grade II Tube type: Oral Tube size: 8.0 mm Number of attempts: 1 Airway Equipment and Method: Stylet Placement Confirmation: ETT inserted through vocal cords under direct vision,  positive ETCO2 and breath sounds checked- equal and bilateral Secured at: 23 cm Tube secured with: Tape Dental Injury: Teeth and Oropharynx as per pre-operative assessment

## 2012-04-19 NOTE — Preoperative (Signed)
Beta Blockers   Reason not to administer Beta Blockers:Not Applicable 

## 2012-04-19 NOTE — Progress Notes (Signed)
  Echocardiogram Echocardiogram Transesophageal has been performed.  Georgian Co 04/19/2012, 8:21 AM

## 2012-04-19 NOTE — OR Nursing (Signed)
Dr Maren Beach began at [phone]

## 2012-04-19 NOTE — Brief Op Note (Signed)
04/16/2012 - 04/19/2012  11:57 AM  PATIENT:  Tyler Fox  66 y.o. male  PRE-OPERATIVE DIAGNOSIS:  CAD  POST-OPERATIVE DIAGNOSIS:  CAD  PROCEDURE:  Procedure(s) (LRB) with comments:  CORONARY ARTERY BYPASS GRAFTING x 5 -LIMA to LAD -SVG to Diagonal 2 -SVG to OM1 -SVG to Distal L Circumflex -SVG to PDA  Endoscopic/Open Harvest Greater Saphenous Vein Right leg  INTRAOPERATIVE TRANSESOPHAGEAL ECHOCARDIOGRAM (N/A)  SURGEON:  Surgeon(s) and Role:    * Kerin Perna, MD - Primary  PHYSICIAN ASSISTANT: Lowella Dandy PA-C  ANESTHESIA:   general  EBL:  Total I/O In: -  Out: 1250 [Urine:1250]  BLOOD ADMINISTERED:10 pk PLTS  DRAINS: Left pleural chest tubes, mediastinal chest drains   LOCAL MEDICATIONS USED:  NONE  SPECIMEN:  No Specimen  DISPOSITION OF SPECIMEN:  N/A  COUNTS:  YES  TOURNIQUET:  * No tourniquets in log *  DICTATION: .Dragon Dictation  PLAN OF CARE: Admit to inpatient   PATIENT DISPOSITION:  ICU - intubated and hemodynamically stable.   Delay start of Pharmacological VTE agent (>24hrs) due to surgical blood loss or risk of bleeding: yes

## 2012-04-19 NOTE — Procedures (Signed)
Extubation Procedure Note  Patient Details:   Name: Tyler Fox DOB: [birth year] MRN: [id number]   Airway Documentation:     Evaluation  O2 sats: 98 Complications: No apparent complications Patient did tolerate procedure well. Bilateral Breath Sounds: Clear   Yes  Pt is awake and can follow commands. NIF -30, VC 900. Pt has good cough/gag/ Positive cuff leak. Extubated pt to 4L [place]. No stridor. BBS clr.  Kandis Nab 04/19/2012, 5:42 PM

## 2012-04-19 NOTE — Progress Notes (Signed)
Chaplain visited patient and family during morning rounds. Patient was being prepped for surgery. Patient was alert and responsive. Wife requested for prayer and support during and after surgery. Chaplain provided ministry of presence and empathic listening. Chaplain also prayed and shared words of hope and encouragement with both patient and family. Chaplain will continue to provide spiritual care as needed to both patient and family. Family and patient expressed appreciation for Chaplains visit and support.

## 2012-04-19 NOTE — Progress Notes (Signed)
Pt was transported to OR holding area. Pt VSS, tolerated well.

## 2012-04-19 NOTE — Transfer of Care (Signed)
Immediate Anesthesia Transfer of Care Note  Patient: Tyler Fox  Procedure(s) Performed: Procedure(s) (LRB) with comments: CORONARY ARTERY BYPASS GRAFTING (CABG) (N/A) - coronary artery bypass graft times five using left internal mammary artery and right leg saphenous vein INTRAOPERATIVE TRANSESOPHAGEAL ECHOCARDIOGRAM (N/A)  Patient Location: SICU  Anesthesia Type:General  Level of Consciousness: sedated  Airway & Oxygen Therapy: Patient remains intubated per anesthesia plan and Patient placed on Ventilator (see vital sign flow sheet for setting)  Post-op Assessment: Report given to PACU RN and Post -op Vital signs reviewed and stable  Post vital signs: Reviewed and stable  Complications: No apparent anesthesia complications

## 2012-04-20 ENCOUNTER — Inpatient Hospital Stay (HOSPITAL_COMMUNITY): Payer: BC Managed Care – PPO

## 2012-04-20 LAB — POCT I-STAT, CHEM 8
Chloride: 109 mEq/L (ref 96–112)
Creatinine, Ser: 1 mg/dL (ref 0.50–1.35)
Creatinine, Ser: 1.1 mg/dL (ref 0.50–1.35)
Glucose, Bld: 130 mg/dL — ABNORMAL HIGH (ref 70–99)
Glucose, Bld: 149 mg/dL — ABNORMAL HIGH (ref 70–99)
HCT: 26 % — ABNORMAL LOW (ref 39.0–52.0)
HCT: 27 % — ABNORMAL LOW (ref 39.0–52.0)
Hemoglobin: 9.2 g/dL — ABNORMAL LOW (ref 13.0–17.0)
Potassium: 4.4 mEq/L (ref 3.5–5.1)
Potassium: 3.9 mEq/L (ref 3.5–5.1)
Sodium: 138 mEq/L (ref 135–145)
TCO2: 24 mmol/L (ref 0–100)

## 2012-04-20 LAB — PREPARE PLATELET PHERESIS

## 2012-04-20 LAB — CBC
HCT: 26.5 % — ABNORMAL LOW (ref 39.0–52.0)
HCT: 26.9 % — ABNORMAL LOW (ref 39.0–52.0)
Hemoglobin: 9 g/dL — ABNORMAL LOW (ref 13.0–17.0)
MCH: 26.8 pg (ref 26.0–34.0)
MCHC: 33.6 g/dL (ref 30.0–36.0)
MCHC: 33.5 g/dL (ref 30.0–36.0)
Platelets: 94 10*3/uL — ABNORMAL LOW (ref 150–400)
RDW: 13.2 % (ref 11.5–15.5)

## 2012-04-20 LAB — GLUCOSE, CAPILLARY
Glucose-Capillary: 167 mg/dL — ABNORMAL HIGH (ref 70–99)
Glucose-Capillary: 191 mg/dL — ABNORMAL HIGH (ref 70–99)

## 2012-04-20 LAB — BASIC METABOLIC PANEL
BUN: 16 mg/dL (ref 6–23)
GFR calc Af Amer: 78 mL/min — ABNORMAL LOW (ref >90)
GFR calc non Af Amer: 67 mL/min — ABNORMAL LOW (ref >90)
Potassium: 4 mEq/L (ref 3.5–5.1)

## 2012-04-20 MED ORDER — INSULIN ASPART 100 UNIT/ML ~~LOC~~ SOLN: 0.0000 [IU] | SUBCUTANEOUS | Status: DC

## 2012-04-20 MED ORDER — ENOXAPARIN SODIUM 30 MG/0.3ML ~~LOC~~ SOLN
30.0000 mg | SUBCUTANEOUS | Status: DC
  Administered 2012-04-20 – 2012-04-23 (×4): 30 mg via SUBCUTANEOUS
  Filled 2012-04-20 (×5): qty 0.3

## 2012-04-20 MED ORDER — FUROSEMIDE 10 MG/ML IJ SOLN
20.0000 mg | Freq: Once | INTRAMUSCULAR | Status: AC
  Administered 2012-04-20: 20 mg via INTRAVENOUS
  Filled 2012-04-20: qty 2

## 2012-04-20 NOTE — Progress Notes (Signed)
Patient ID: Tyler Fox, male   DOB: [birth year], 66 y.o.   MRN: [id number] TCTS DAILY PROGRESS NOTE                   [address]            [place] [address]          [phone]      1 Day Post-Op Procedure(s) (LRB): CORONARY ARTERY BYPASS GRAFTING (CABG) (N/A) INTRAOPERATIVE TRANSESOPHAGEAL ECHOCARDIOGRAM (N/A)  Total Length of Stay:  LOS: 4 days   Subjective: Patient is awake alert and neurologically intact with good pain control.  Objective: Vital signs in last 24 hours: Temp:  [96.6 F (35.9 C)-100.6 F (38.1 C)] 99 F (37.2 C) (12/07 0700) Pulse Rate:  [72-90] 88  (12/07 0700) Cardiac Rhythm:  [-] Normal sinus rhythm (12/07 0600) Resp:  [10-25] 16  (12/07 0700) BP: (70-116)/(36-81) 89/50 mmHg (12/07 0700) SpO2:  [96 %-100 %] 96 % (12/07 0700) FiO2 (%):  [40 %-50 %] 40 % (12/06 1700) Weight:  [189 lb 9.5 oz (86 kg)] 189 lb 9.5 oz (86 kg) (12/07 0500)  Filed Weights   04/16/12 1100 04/20/12 0500  Weight: 175 lb (79.379 kg) 189 lb 9.5 oz (86 kg)    Weight change:    Hemodynamic parameters for last 24 hours: PAP: (18-46)/(2-28) 24/7 mmHg CO:  [3.7 L/min-5.4 L/min] 4.9 L/min CI:  [1.9 L/min/m2-2.7 L/min/m2] 2.5 L/min/m2  Intake/Output from previous day: 12/06 0701 - 12/07 0700 In: 5807.8 [I.V.:3429.8; Blood:642; NG/GT:30; IV Piggyback:1706] Out: 4075 [Urine:2825; Blood:750; Chest Tube:500]  Intake/Output this shift:    Current Meds: Scheduled Meds:   . [COMPLETED] acetaminophen  1,000 mg Intravenous Once  . acetaminophen  1,000 mg Oral Q6H   Or  . acetaminophen (TYLENOL) oral liquid 160 mg/5 mL  975 mg Per Tube Q6H  . [COMPLETED] aminocaproic acid (AMICAR) for OHS   Intravenous To OR  . aspirin EC  325 mg Oral Daily   Or  . aspirin  324 mg Per Tube Daily  . bisacodyl  10 mg Oral Daily   Or  . bisacodyl  10 mg Rectal Daily  . [COMPLETED] cefUROXime (ZINACEF)  IV  1.5 g Intravenous To OR  . cefUROXime (ZINACEF)  IV  1.5 g  Intravenous Q12H  . docusate sodium  200 mg Oral Daily  . [COMPLETED] heparin-papaverine-plasmalyte irrigation   Irrigation To OR  . [COMPLETED] heparin-papaverine-plasmalyte irrigation   Irrigation To OR  . [EXPIRED] insulin aspart  0-24 Units Subcutaneous Q2H   Followed by  . insulin aspart  0-24 Units Subcutaneous Q4H  . [COMPLETED] insulin (NOVOLIN-R) infusion   Intravenous To OR  . insulin regular  0-10 Units Intravenous TID WC  . [COMPLETED] magnesium sulfate  4 g Intravenous Once  . [COMPLETED] metoCLOPramide (REGLAN) injection  10 mg Intravenous Q6H  . metoprolol tartrate  12.5 mg Oral BID   Or  . metoprolol tartrate  12.5 mg Per Tube BID  . multivitamin with minerals  1 tablet Oral Daily  . pantoprazole  40 mg Oral Daily  . [COMPLETED] potassium chloride  10 mEq Intravenous Q1 Hr x 3  . [COMPLETED] potassium chloride  10 mEq Intravenous Once  . rosuvastatin  20 mg Oral q1800  . sodium chloride  3 mL Intravenous Q12H  . vancomycin  1,000 mg Intravenous Q12H  . [DISCONTINUED] aspirin EC  325 mg Oral Daily  . [DISCONTINUED] bisacodyl  5 mg Oral Once  . [  DISCONTINUED] cefUROXime (ZINACEF)  IV  750 mg Intravenous To OR  . [DISCONTINUED] chlorhexidine  60 mL Topical Once  . [DISCONTINUED] DOPamine  2-20 mcg/kg/min Intravenous To OR  . [DISCONTINUED] epinephrine  0.5-20 mcg/min Intravenous To OR  . [DISCONTINUED] famotidine (PEPCID) IV  20 mg Intravenous Q12H  . [DISCONTINUED] magnesium sulfate  40 mEq Other To OR  . [DISCONTINUED] metoprolol tartrate  12.5 mg Oral BID  . [DISCONTINUED] nitroGLYCERIN  2-200 mcg/min Intravenous To OR  . [DISCONTINUED] pantoprazole  40 mg Oral Q0600  . [DISCONTINUED] phenylephrine (NEO-SYNEPHRINE) Adult infusion  30-200 mcg/min Intravenous To OR  . [DISCONTINUED] polyethylene glycol  17 g Oral Q1400  . [DISCONTINUED] potassium chloride  80 mEq Other To OR  . [DISCONTINUED] vancomycin  1,000 mg Intravenous Once   Continuous Infusions:   . sodium  chloride 20 mL/hr at 04/19/12 1800  . sodium chloride 20 mL/hr at 04/19/12 1400  . sodium chloride 250 mL (04/20/12 0600)  . dexmedetomidine Stopped (04/19/12 1600)  . DOPamine 2 mcg/kg/min (04/19/12 1900)  . insulin (NOVOLIN-R) infusion Stopped (04/19/12 1600)  . lactated ringers 20 mL/hr at 04/19/12 1400  . nitroGLYCERIN Stopped (04/19/12 1500)  . phenylephrine (NEO-SYNEPHRINE) Adult infusion 5 mcg/min (04/20/12 0400)  . [DISCONTINUED] sodium chloride 10 mL/hr at 04/17/12 2009  . [DISCONTINUED] heparin Stopped (04/19/12 0545)  . [DISCONTINUED] lactated ringers 50 mL/hr (04/18/12 1501)  . [DISCONTINUED] nitroGLYCERIN Stopped (04/19/12 1340)   PRN Meds:.albumin human, ALPRAZolam, [EXPIRED] lactated ringers, metoprolol, midazolam, [EXPIRED]  morphine injection, morphine injection, ondansetron (ZOFRAN) IV, oxyCODONE, sodium chloride, [DISCONTINUED] 0.9 % irrigation (POUR BTL), [DISCONTINUED] acetaminophen, [DISCONTINUED] acetaminophen, [DISCONTINUED] acetaminophen, [DISCONTINUED] ALPRAZolam, [DISCONTINUED] fentaNYL, [DISCONTINUED] hemostatic agents, [DISCONTINUED]  HYDROmorphone (DILAUDID) injection [DISCONTINUED] meperidine (DEMEROL) injection, [DISCONTINUED] midazolam, [DISCONTINUED]  morphine injection, [DISCONTINUED]  morphine injection, [DISCONTINUED] nitroGLYCERIN, [DISCONTINUED] ondansetron (ZOFRAN) IV, [DISCONTINUED] ondansetron, [DISCONTINUED] oxyCODONE, [DISCONTINUED] oxyCODONE, [DISCONTINUED] polyethylene glycol, [DISCONTINUED] promethazine, [DISCONTINUED] zolpidem  General appearance: alert, cooperative and no distress Neurologic: intact Heart: regular rate and rhythm, S1, S2 normal, no murmur, click, rub or gallop and normal apical impulse Lungs: clear to auscultation bilaterally Abdomen: soft, non-tender; bowel sounds normal; no masses,  no organomegaly Extremities: extremities normal, atraumatic, no cyanosis or edema and Homans sign is negative, no sign of DVT Wound: Sternum is  stable  Lab Results: CBC: Basename 04/20/12 0427 04/19/12 1959  WBC 7.0 7.9  HGB 8.9* 9.4*8.8*  HCT 26.5* 27.3*26.0*  PLT 94* 100*   BMET:  Basename 04/20/12 0427 04/19/12 1959 04/19/12 0455  NA 140 140 --  K 4.0 4.4 --  CL 109 109 --  CO2 24 -- 25  GLUCOSE 125* 130* --  BUN 16 12 --  CREATININE 1.10 0.971.00 --  CALCIUM 7.6* -- 9.2    PT/INR:  Basename 04/19/12 1430  LABPROT 16.7*  INR 1.39   Radiology: Dg Chest 2 View  04/18/2012  *RADIOLOGY REPORT*  Clinical Data: Preop for CABG, some shortness of breath  CHEST - 2 VIEW  Comparison: Chest x-ray of 04/16/2012  Findings: No active infiltrate or effusion is seen.  The heart is within normal limits in size.  No bony abnormality is noted.  IMPRESSION: No active lung disease.   Original Report Authenticated By: Dwyane Dee, M.D.    Dg Chest Portable 1 View In Am  04/20/2012  *RADIOLOGY REPORT*  Clinical Data: Postop.  PORTABLE CHEST - 1 VIEW  Comparison: 04/19/2012  Findings: Stable position of the chest drains.  Swan-Ganz catheter in the proximal right pulmonary artery.  Volume  loss in the left hemithorax.  There is no significant pneumothorax.  The right lung is clear. Endotracheal tube and nasogastric tube have been removed.  IMPRESSION: Volume loss in the left lung.  Chest tubes without a pneumothorax.   Original Report Authenticated By: Richarda Overlie, M.D.    Dg Chest Portable 1 View  04/19/2012  *RADIOLOGY REPORT*  Clinical Data: Post CABG  PORTABLE CHEST - 1 VIEW  Comparison: 04/18/2012  Findings: The patient is status post median sternotomy. Endotracheal tube in place with tip 4.5 cm above the carina.  Right IJ Swan-Ganz catheter in place.  NG tube in place.  There is a left chest tube with tip in the left apical region.  No diagnostic pneumothorax.  No pulmonary edema.  NG tube in place.  IMPRESSION: Status post CABG.  No acute infiltrate or pulmonary edema.  No diagnostic pneumothorax.  Support apparatus as described above.    Original Report Authenticated By: Natasha Mead, M.D.      Assessment/Plan: S/P Procedure(s) (LRB): CORONARY ARTERY BYPASS GRAFTING (CABG) (N/A) INTRAOPERATIVE TRANSESOPHAGEAL ECHOCARDIOGRAM (N/A) Mobilize Diuresis Diabetes control Continue foley due to diuresing patient and urinary output monitoring See progression orders Expected Acute  Blood - loss Anemia Mild thrombocytopenia    Vanessa Alesi B 04/20/2012 7:51 AM

## 2012-04-20 NOTE — Op Note (Signed)
NAME:  Tyler Fox, Tyler Fox NO.:  [id number]  MEDICAL RECORD NO.:  [id number]  LOCATION:  2301                         FACILITY:  [facility]  PHYSICIAN:  Burna Forts, M.D.DATE OF BIRTH:  May 29, 1945  DATE OF PROCEDURE:  04/19/2012 DATE OF DISCHARGE:                              OPERATIVE REPORT   INDICATIONS FOR PROCEDURE:  Mr. Gainor is a 66 year old patient of Dr. Nanetta Batty, who presents today for coronary artery bypass grafting to be performed by Dr. Kathlee Nations Trigt.  He was brought to the holding area the morning of surgery where under local anesthesia with sedation, radial, arterial, and pulmonary artery lines were begun.  We have been asked by Dr. Kathlee Nations Trigt to place a TEE probe intraop for evaluation of cardiac structures and function.  After induction of general anesthesia, the TEE probe was prepared and inserted into the oropharynx, further into the stomach in preparation for imaging of the cardiac structures.  PRE-CARDIOPULMONARY BYPASS TEE EXAMINATION:  Left ventricle:  The left ventricular chamber is seen initially in the short-axis view.  There is mild concentric left ventricular hypertrophy.  There is good to excellent overall contractile pattern appreciated.  All segmental wall areas are thickened and moves inward during the systolic contraction.  Ejection fraction is estimated at 50% or greater.  Mitral valve:  The mitral valve is seen in the four-chamber view initially.  It is thin, compliant, mobile, and coaptation occcurs appropriately during the systolic contraction.  Normal annular area and dimensions are appreciated.  Multiple views are taken of the mitral valve in both 4- and 2-chamber views.  It is normal.  Left atrium:  The left atrial chamber is interrogated.  No masses are appreciated within.  The interatrial septum is interrogated and is intact.  Right atrium:  Normal right atrial chamber appreciated with pulmonary artery  catheter across.  Right ventricle:  The right ventricular chamber is seen in the 4-chamber view.  There is normal size and contractility appreciated.  Tricuspid valve:  Normal, compliant and competent tricuspid valve.  The aortic valve is then viewed.  The aortic valve shows 3 cusps, open satisfactorily.  There was no regurgitant or stenotic lesions appreciated in this normal-appearing aortic valve.  The patient is placed on cardiopulmonary bypass.  Coronary bypass grafting is carried out.  The patient is rewarmed and separated from cardiopulmonary bypass with the initial attempt.  In the postoperative views, left ventricular and mitral valve, and other annular structures are interrogated once again.  There are no changes in any of the valve structures appreciated.  Again, attention is drawn to the short-axis view of the left ventricle.  There are some mild diminished amount of contractile pattern appreciated in the early bypass period, but with time and separation of cardiopulmonary bypass, this was much improved within 20-25 minutes.  No other changes were noted or appreciated.  The patient was ultimately returned to the Cardiac Intensive Care Unit in stable condition.          ______________________________ Burna Forts, M.D.     JTM/MEDQ  D:  04/19/2012  T:  04/20/2012  Job:  [id number]

## 2012-04-20 NOTE — Progress Notes (Signed)
[facility] Progress Note  Subjective:  Day 1 s/p CABG x 5  Objective:   Vital Signs in the last 24 hours: Temp:  [96.6 F (35.9 C)-100.6 F (38.1 C)] 99 F (37.2 C) (12/07 0700) Pulse Rate:  [72-90] 88  (12/07 0700) Resp:  [10-25] 16  (12/07 0700) BP: (70-116)/(36-81) 91/52 mmHg (12/07 0800) SpO2:  [96 %-100 %] 96 % (12/07 0700) FiO2 (%):  [40 %-50 %] 40 % (12/06 1700) Weight:  [86 kg (189 lb 9.5 oz)] 86 kg (189 lb 9.5 oz) (12/07 0500)  Intake/Output from previous day: 12/06 0701 - 12/07 0700 In: 5807.8 [I.V.:3429.8; Blood:642; NG/GT:30; IV Piggyback:1706] Out: 4105 [Urine:2855; Blood:750; Chest Tube:500]  Scheduled:   . [COMPLETED] acetaminophen  1,000 mg Intravenous Once  . acetaminophen  1,000 mg Oral Q6H   Or  . acetaminophen (TYLENOL) oral liquid 160 mg/5 mL  975 mg Per Tube Q6H  . [COMPLETED] aminocaproic acid (AMICAR) for OHS   Intravenous To OR  . aspirin EC  325 mg Oral Daily   Or  . aspirin  324 mg Per Tube Daily  . bisacodyl  10 mg Oral Daily   Or  . bisacodyl  10 mg Rectal Daily  . [COMPLETED] cefUROXime (ZINACEF)  IV  1.5 g Intravenous To OR  . cefUROXime (ZINACEF)  IV  1.5 g Intravenous Q12H  . docusate sodium  200 mg Oral Daily  . enoxaparin  30 mg Subcutaneous Q24H  . furosemide  20 mg Intravenous Once  . [COMPLETED] heparin-papaverine-plasmalyte irrigation   Irrigation To OR  . [COMPLETED] heparin-papaverine-plasmalyte irrigation   Irrigation To OR  . [EXPIRED] insulin aspart  0-24 Units Subcutaneous Q2H   Followed by  . insulin aspart  0-24 Units Subcutaneous Q4H  . insulin aspart  0-24 Units Subcutaneous Q4H  . [COMPLETED] insulin (NOVOLIN-R) infusion   Intravenous To OR  . insulin regular  0-10 Units Intravenous TID WC  . [COMPLETED] magnesium sulfate  4 g Intravenous Once  . [COMPLETED] metoCLOPramide (REGLAN) injection  10 mg Intravenous Q6H  . metoprolol tartrate  12.5 mg Oral BID   Or  . metoprolol tartrate   12.5 mg Per Tube BID  . multivitamin with minerals  1 tablet Oral Daily  . pantoprazole  40 mg Oral Daily  . [COMPLETED] potassium chloride  10 mEq Intravenous Q1 Hr x 3  . [COMPLETED] potassium chloride  10 mEq Intravenous Once  . rosuvastatin  20 mg Oral q1800  . sodium chloride  3 mL Intravenous Q12H  . vancomycin  1,000 mg Intravenous Q12H  . [DISCONTINUED] aspirin EC  325 mg Oral Daily  . [DISCONTINUED] bisacodyl  5 mg Oral Once  . [DISCONTINUED] cefUROXime (ZINACEF)  IV  750 mg Intravenous To OR  . [DISCONTINUED] chlorhexidine  60 mL Topical Once  . [DISCONTINUED] DOPamine  2-20 mcg/kg/min Intravenous To OR  . [DISCONTINUED] epinephrine  0.5-20 mcg/min Intravenous To OR  . [DISCONTINUED] famotidine (PEPCID) IV  20 mg Intravenous Q12H  . [DISCONTINUED] magnesium sulfate  40 mEq Other To OR  . [DISCONTINUED] metoprolol tartrate  12.5 mg Oral BID  . [DISCONTINUED] nitroGLYCERIN  2-200 mcg/min Intravenous To OR  . [DISCONTINUED] pantoprazole  40 mg Oral Q0600  . [DISCONTINUED] phenylephrine (NEO-SYNEPHRINE) Adult infusion  30-200 mcg/min Intravenous To OR  . [DISCONTINUED] polyethylene glycol  17 g Oral Q1400  . [DISCONTINUED] potassium chloride  80 mEq Other To OR  . [DISCONTINUED] vancomycin  1,000 mg Intravenous Once  Physical Exam:   General appearance: alert, cooperative and no distress Neck: no carotid bruit and no JVD Lungs: no wheezing, BS decreased at base Heart: S1, S2 normal and 1/6 sem; no rub Abdomen: soft, non-tender; bowel sounds normal; no masses,  no organomegaly Extremities: trivial edema Neurologic: Grossly normal   Rate: 88  Rhythm: Atrial paced  Lab Results:    Basename 04/20/12 0427 04/19/12 1959 04/19/12 0455  NA 140 140 --  K 4.0 4.4 --  CL 109 109 --  CO2 24 -- 25  GLUCOSE 125* 130* --  BUN 16 12 --  CREATININE 1.10 0.971.00 --   No results found for this basename: TROPONINI:2,CK,MB:2 in the last 72 hours Hepatic Function Panel No  results found for this basename: PROT,ALBUMIN,AST,ALT,ALKPHOS,BILITOT,BILIDIR,IBILI in the last 72 hours  Basename 04/19/12 1430  INR 1.39    Lipid Panel     Component Value Date/Time   CHOL 266* 04/16/2012 1259   TRIG 113 04/16/2012 1259   HDL 60 04/16/2012 1259   CHOLHDL 4.4 04/16/2012 1259   VLDL 23 04/16/2012 1259   LDLCALC 183* 04/16/2012 1259     Imaging:  Dg Chest 2 View  04/18/2012  *RADIOLOGY REPORT*  Clinical Data: Preop for CABG, some shortness of breath  CHEST - 2 VIEW  Comparison: Chest x-ray of 04/16/2012  Findings: No active infiltrate or effusion is seen.  The heart is within normal limits in size.  No bony abnormality is noted.  IMPRESSION: No active lung disease.   Original Report Authenticated By: Dwyane Dee, M.D.    Dg Chest Portable 1 View In Am  04/20/2012  *RADIOLOGY REPORT*  Clinical Data: Postop.  PORTABLE CHEST - 1 VIEW  Comparison: 04/19/2012  Findings: Stable position of the chest drains.  Swan-Ganz catheter in the proximal right pulmonary artery.  Volume loss in the left hemithorax.  There is no significant pneumothorax.  The right lung is clear. Endotracheal tube and nasogastric tube have been removed.  IMPRESSION: Volume loss in the left lung.  Chest tubes without a pneumothorax.   Original Report Authenticated By: Richarda Overlie, M.D.    Dg Chest Portable 1 View  04/19/2012  *RADIOLOGY REPORT*  Clinical Data: Post CABG  PORTABLE CHEST - 1 VIEW  Comparison: 04/18/2012  Findings: The patient is status post median sternotomy. Endotracheal tube in place with tip 4.5 cm above the carina.  Right IJ Swan-Ganz catheter in place.  NG tube in place.  There is a left chest tube with tip in the left apical region.  No diagnostic pneumothorax.  No pulmonary edema.  NG tube in place.  IMPRESSION: Status post CABG.  No acute infiltrate or pulmonary edema.  No diagnostic pneumothorax.  Support apparatus as described above.   Original Report Authenticated By: Natasha Mead, M.D.        Assessment/Plan:   Principal Problem:  *NSTEMI (non-ST elevated myocardial infarction) Active Problems:  Family history of coronary artery disease  Bradycardia, asymptomatic  Hyperlipidemia  CAD, multiple vessel  Day 1 s/p CABG x 5. Alert, neurologically intact and with stable hemodynamics. ECG NSR nonspecific STT changes inferolaterally. Doing well.    Lennette Bihari, MD, [facility] 04/20/2012, 8:06 AM

## 2012-04-20 NOTE — Progress Notes (Signed)
Patient ID: Tyler Fox, male   DOB: 1946/04/27, 66 y.o.   MRN: [id number]                   [address]            [place] [address]          [phone]     1 Day Post-Op Procedure(s) (LRB): CORONARY ARTERY BYPASS GRAFTING (CABG) (N/A) INTRAOPERATIVE TRANSESOPHAGEAL ECHOCARDIOGRAM (N/A)  Total Length of Stay:  LOS: 4 days  BP 102/56  Pulse 88  Temp 98.8 F (37.1 C) (Oral)  Resp 23  Ht 5\' 11"  (1.803 m)  Wt 189 lb 9.5 oz (86 kg)  BMI 26.44 kg/m2  SpO2 100%  .Intake/Output      12/06 0701 - 12/07 0700 12/07 0701 - 12/08 0700   P.O.  1020   I.V. (mL/kg) 3429.8 (39.9) 226 (2.6)   Blood 642    NG/GT 30    IV Piggyback 1706 250   Total Intake(mL/kg) 5807.8 (67.5) 1496 (17.4)   Urine (mL/kg/hr) 2855 (1.4) 570 (0.6)   Stool     Blood 750    Chest Tube 500 100   Total Output 4105 670   Net +1702.8 +826             . sodium chloride 20 mL/hr at 04/19/12 1800  . sodium chloride 20 mL/hr at 04/20/12 1630  . sodium chloride 250 mL (04/20/12 0600)  . dexmedetomidine Stopped (04/19/12 1600)  . DOPamine 2 mcg/kg/min (04/19/12 1900)  . insulin (NOVOLIN-R) infusion Stopped (04/19/12 1600)  . lactated ringers 20 mL/hr at 04/19/12 1400  . nitroGLYCERIN Stopped (04/19/12 1500)  . phenylephrine (NEO-SYNEPHRINE) Adult infusion 5 mcg/min (04/20/12 0400)     Lab Results  Component Value Date   WBC 8.7 04/20/2012   HGB 9.0* 04/20/2012   HCT 26.9* 04/20/2012   PLT 103* 04/20/2012   GLUCOSE 149* 04/20/2012   CHOL 266* 04/16/2012   TRIG 113 04/16/2012   HDL 60 04/16/2012   LDLCALC 183* 04/16/2012   NA 138 04/20/2012   K 3.9 04/20/2012   CL 103 04/20/2012   CREATININE 1.03 04/20/2012   BUN 22 04/20/2012   CO2 24 04/20/2012   INR 1.39 04/19/2012   HGBA1C 5.8* 04/17/2012   Stable day walked only 100 ft   Delight Ovens MD  Beeper [phone] Office [phone] 04/20/2012 6:58 PM

## 2012-04-20 NOTE — Op Note (Signed)
NAME:  IZAIH, KATAOKA NO.:  [id number]  MEDICAL RECORD NO.:  [id number]  LOCATION:  2301                         FACILITY:  [facility]  PHYSICIAN:  Kerin Perna, M.D.  DATE OF BIRTH:  02/03/1946  DATE OF PROCEDURE:  04/19/2012 DATE OF DISCHARGE:                              OPERATIVE REPORT   OPERATION: 1. Coronary artery bypass grafting x5 (left internal mammary artery     left anterior descending, saphenous vein graft to diagonal,     saphenous vein graft to obtuse marginal, saphenous vein graft to     distal circumflex, saphenous vein graft to posterior descending. 2. Endoscopic harvest of right leg greater saphenous vein, exposure of     left leg greater saphenous vein, but not harvested due to severe     dilatation.  PREOPERATIVE DIAGNOSIS:  Non ST-elevation myocardial infarction with unstable angina, three-vessel coronary disease.  POSTOPERATIVE DIAGNOSIS:  Non ST-elevation myocardial infarction with unstable angina, three-vessel coronary disease.  SURGEON:  Kerin Perna, M.D.  ASSISTANT:  Pauline Good, PA-C.  ANESTHESIA:  General by Dr. Burna Forts.  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  Cardiac catheterization demonstrated severe 3-vessel disease with probable recent occlusion of the right coronary with reconstitution via collaterals.  LV function was fairly well preserved.  He was felt to be a candidate for surgical coronary revascularization.  He was placed on IV heparin and nitroglycerin, and prepared for surgery.  Prior to surgery, I examined the patient in the CCU on several occasions and reviewed the results of the cardiac catheterization with the patient and family.  I discussed the indications and expected benefits of multivessel coronary artery bypass grafting for treatment of his coronary artery disease.  I reviewed the  alternatives to surgical therapy as well.  I discussed with him the major details of surgery including the use of general anesthesia and cardiopulmonary bypass, the location of the surgical incisions, the expected postoperative hospital recovery, and the potential risks of stroke, bleeding, blood transfusion, infection, and death.  After reviewing these issues, he demonstrated his understanding and agreed to proceed with surgery under what I felt was an informed consent.  FINDINGS: 1. Suboptimal vein harvested from the right leg.  The left leg vein     was exposed, but was worse quality due to severe dilatation. 2. Diffuse coronary artery disease with adequate, but suboptimal     targets. 3. No packed cell transfusion required for the surgery. 4. Good LV function documented by echo following separation from     cardiopulmonary bypass.  OPERATIVE PROCEDURE:  The patient was brought to the operating room and placed supine on the operating table where general anesthesia was induced.  A transesophageal echo probe was placed by the anesthesiologist.  The patient was prepped and draped as a sterile field.  A sternal incision was made as the saphenous vein was harvested endoscopically from the right leg.  The internal mammary artery was harvested as a pedicle graft and was a good vessel with excellent flow. The sternal retractor was placed and the pericardium was opened and suspended.  Pursestrings were placed in the ascending aorta and right atrium and after the vein had been harvested and inspected, the patient was heparinized and cannulated and placed on bypass.  The coronary arteries were identified for grafting and the mammary artery and vein grafts were prepared for the distal anastomoses.  The veins to the OM and diagonal were significantly dilated 15-20 mm.  Cardioplegia cannulas were placed for both antegrade and retrograde cold blood cardioplegia and the patient was cooled to 32  degrees.  The aortic crossclamp was applied.  1 L of cold blood cardioplegia was delivered in split doses between the antegrade and retrograde catheters.  There was good cardioplegic arrest and septal temperature dropped less than 12 degrees. Cardioplegia was delivered every 20 minutes or less while the crossclamp was in place.  The distal coronary anastomoses were then performed.  The first distal anastomosis was to the distal posterior descending.  This was totally occluded proximally.  A reverse saphenous vein was sewn end-to-side with running 7-0 Prolene and there was good flow through the graft.  Second distal anastomosis was to the 2nd diagonal branch to the LAD.  This was a 1.5-mm vessel with proximal 80% calcified stenosis.  A reverse saphenous vein was sewn end-to-side with running 7-0 Prolene with good flow through the graft.  The third distal anastomosis was to the distal circumflex.  This was a 1.2-mm vessel with proximal 80% stenosis.  A reverse saphenous vein was smaller caliber was sewn end-to-side with running 7-0 Prolene with good flow through the graft.  Cardioplegia was redosed.  The fourth distal anastomosis was to the OM branch of the circumflex, which ran in the ramus distribution.  There was a larger intramyocardial vessel with proximal 80% stenosis.  A large vein was sewn end-to-side with running 7-0 Prolene with good flow through the graft.  The 5th distal anastomosis was to the distal third of the LAD. More proximally, it was intramyocardial.  The left IMA pedicle was brought through an opening created in the left lateral pericardium, was brought down onto the LAD and sewn end-to-side with running 8-0 Prolene. There was good flow through the anastomosis after briefly releasing the pedicle bulldog on the mammary artery.  The bulldog was reapplied and the pedicle was secured to the epicardium.  Cardioplegia redosed.  With the cross-clamp still in place, 4  proximal vein anastomoses were performed on the ascending aorta with a 4.0 mm punch running 7-0 Prolene.  Prior to tying down the final proximal anastomosis, air was vented from the coronaries with a dose of retrograde warm blood cardioplegia.  Crossclamp was removed.  The heart resumed a spontaneous rhythm.  Air was aspirated from the graft.  The grafts were open and each had good flow.  Hemostasis was documented to the proximal and distal anastomoses.  Cardioplegia cannulas were removed.  The patient was rewarmed to 37 degrees. Temporary pacing wires were applied.  The lungs were expanded and the ventilator was resumed.  The patient was weaned off bypass without inotropes.  Blood pressure and cardiac output were adequate.  Echo showed good LV global function.  Protamine was administered without adverse reaction.  The cannula was removed.  The mediastinum was irrigated with warm saline.  The superior pericardial fat was closed over the aorta.  An anterior mediastinal and a left pleural chest tube were placed and brought out through separate incisions.  The sternum was closed with wire.  Pectoralis fascia was closed in running #1 Vicryl.  The subcutaneous and skin layers were closed in running Vicryl and sterile dressings were applied.  Total cardiopulmonary bypass time was 155 minutes.     Kerin Perna, M.D.     PV/MEDQ  D:  04/19/2012  T:  04/20/2012  Job:  [id number]  cc:   Nanetta Batty, M.D.

## 2012-04-21 ENCOUNTER — Inpatient Hospital Stay (HOSPITAL_COMMUNITY): Payer: BC Managed Care – PPO

## 2012-04-21 LAB — BASIC METABOLIC PANEL
BUN: 25 mg/dL — ABNORMAL HIGH (ref 6–23)
CO2: 25 mEq/L (ref 19–32)
Calcium: 7.9 mg/dL — ABNORMAL LOW (ref 8.4–10.5)
Chloride: 103 mEq/L (ref 96–112)
Creatinine, Ser: 0.98 mg/dL (ref 0.50–1.35)
GFR calc Af Amer: >90 mL/min (ref >90)
GFR calc non Af Amer: 83 mL/min — ABNORMAL LOW (ref >90)
Glucose, Bld: 119 mg/dL — ABNORMAL HIGH (ref 70–99)
Potassium: 3.8 mEq/L (ref 3.5–5.1)
Sodium: 135 mEq/L (ref 135–145)

## 2012-04-21 LAB — CBC
HCT: 25.7 % — ABNORMAL LOW (ref 39.0–52.0)
Hemoglobin: 8.8 g/dL — ABNORMAL LOW (ref 13.0–17.0)
MCH: 27.7 pg (ref 26.0–34.0)
MCHC: 34.2 g/dL (ref 30.0–36.0)
MCV: 80.8 fL (ref 78.0–100.0)
Platelets: 121 10*3/uL — ABNORMAL LOW (ref 150–400)
RBC: 3.18 MIL/uL — ABNORMAL LOW (ref 4.22–5.81)
RDW: 13.5 % (ref 11.5–15.5)
WBC: 8.3 10*3/uL (ref 4.0–10.5)

## 2012-04-21 LAB — GLUCOSE, CAPILLARY
Glucose-Capillary: 109 mg/dL — ABNORMAL HIGH (ref 70–99)
Glucose-Capillary: 116 mg/dL — ABNORMAL HIGH (ref 70–99)
Glucose-Capillary: 129 mg/dL — ABNORMAL HIGH (ref 70–99)
Glucose-Capillary: 153 mg/dL — ABNORMAL HIGH (ref 70–99)
Glucose-Capillary: 159 mg/dL — ABNORMAL HIGH (ref 70–99)

## 2012-04-21 MED ORDER — ONDANSETRON HCL 4 MG PO TABS: 4.0000 mg | ORAL_TABLET | Freq: Four times a day (QID) | ORAL | Status: DC | PRN

## 2012-04-21 MED ORDER — PANTOPRAZOLE SODIUM 40 MG PO TBEC
40.0000 mg | DELAYED_RELEASE_TABLET | Freq: Every day | ORAL | Status: DC
  Administered 2012-04-21 – 2012-04-23 (×3): 40 mg via ORAL
  Filled 2012-04-21 (×3): qty 1

## 2012-04-21 MED ORDER — MOVING RIGHT ALONG BOOK
Freq: Once | Status: AC
  Administered 2012-04-21: 08:00:00
  Filled 2012-04-21: qty 1

## 2012-04-21 MED ORDER — METOPROLOL TARTRATE 12.5 MG HALF TABLET
12.5000 mg | ORAL_TABLET | Freq: Two times a day (BID) | ORAL | Status: DC
  Administered 2012-04-21 – 2012-04-23 (×5): 12.5 mg via ORAL
  Filled 2012-04-21 (×6): qty 1

## 2012-04-21 MED ORDER — SODIUM CHLORIDE 0.9 % IJ SOLN: 3.0000 mL | INTRAMUSCULAR | Status: DC | PRN

## 2012-04-21 MED ORDER — BISACODYL 5 MG PO TBEC
10.0000 mg | DELAYED_RELEASE_TABLET | Freq: Every day | ORAL | Status: DC | PRN
  Administered 2012-04-21 – 2012-04-22 (×2): 10 mg via ORAL
  Filled 2012-04-21 (×2): qty 1
  Filled 2012-04-21: qty 2

## 2012-04-21 MED ORDER — FUROSEMIDE 40 MG PO TABS
40.0000 mg | ORAL_TABLET | Freq: Every day | ORAL | Status: AC
  Administered 2012-04-21 – 2012-04-23 (×3): 40 mg via ORAL
  Filled 2012-04-21 (×3): qty 1

## 2012-04-21 MED ORDER — OXYCODONE HCL 5 MG PO TABS
5.0000 mg | ORAL_TABLET | ORAL | Status: DC | PRN
  Administered 2012-04-21: 10 mg via ORAL
  Filled 2012-04-21: qty 2

## 2012-04-21 MED ORDER — DOCUSATE SODIUM 100 MG PO CAPS
200.0000 mg | ORAL_CAPSULE | Freq: Every day | ORAL | Status: DC
  Administered 2012-04-21 – 2012-04-22 (×2): 200 mg via ORAL
  Filled 2012-04-21 (×4): qty 2

## 2012-04-21 MED ORDER — TRAMADOL HCL 50 MG PO TABS
50.0000 mg | ORAL_TABLET | ORAL | Status: DC | PRN
  Filled 2012-04-21: qty 1

## 2012-04-21 MED ORDER — INSULIN ASPART 100 UNIT/ML ~~LOC~~ SOLN: 2.0000 [IU] | SUBCUTANEOUS | Status: DC

## 2012-04-21 MED ORDER — MAGNESIUM HYDROXIDE 400 MG/5ML PO SUSP: 30.0000 mL | Freq: Every day | ORAL | Status: DC | PRN

## 2012-04-21 MED ORDER — SODIUM CHLORIDE 0.9 % IV SOLN: 250.0000 mL | INTRAVENOUS | Status: DC | PRN

## 2012-04-21 MED ORDER — BISACODYL 10 MG RE SUPP: 10.0000 mg | Freq: Every day | RECTAL | Status: DC | PRN

## 2012-04-21 MED ORDER — INSULIN ASPART 100 UNIT/ML ~~LOC~~ SOLN
0.0000 [IU] | Freq: Three times a day (TID) | SUBCUTANEOUS | Status: DC
  Administered 2012-04-21 (×2): 2 [IU] via SUBCUTANEOUS

## 2012-04-21 MED ORDER — SODIUM CHLORIDE 0.9 % IJ SOLN
3.0000 mL | Freq: Two times a day (BID) | INTRAMUSCULAR | Status: DC
  Administered 2012-04-21 – 2012-04-23 (×6): 3 mL via INTRAVENOUS

## 2012-04-21 MED ORDER — ACETAMINOPHEN 325 MG PO TABS
650.0000 mg | ORAL_TABLET | Freq: Four times a day (QID) | ORAL | Status: DC | PRN
  Administered 2012-04-23 (×3): 650 mg via ORAL
  Filled 2012-04-21 (×3): qty 2

## 2012-04-21 MED ORDER — ASPIRIN EC 325 MG PO TBEC
325.0000 mg | DELAYED_RELEASE_TABLET | Freq: Every day | ORAL | Status: DC
  Administered 2012-04-21 – 2012-04-22 (×2): 325 mg via ORAL
  Filled 2012-04-21 (×3): qty 1

## 2012-04-21 MED ORDER — ONDANSETRON HCL 4 MG/2ML IJ SOLN: 4.0000 mg | Freq: Four times a day (QID) | INTRAMUSCULAR | Status: DC | PRN

## 2012-04-21 MED ORDER — POTASSIUM CHLORIDE CRYS ER 20 MEQ PO TBCR
20.0000 meq | EXTENDED_RELEASE_TABLET | Freq: Every day | ORAL | Status: AC
  Administered 2012-04-21 – 2012-04-23 (×3): 20 meq via ORAL
  Filled 2012-04-21 (×3): qty 1

## 2012-04-21 NOTE — Progress Notes (Signed)
Patient was transferred from Unit 2300 to Unit 2000, after report was given to receiving RN, Aviva Signs.  Patient's medications, chart, & personal belongings were all sent with patient.  Wife notified of transfer.    Keitha Butte, RN

## 2012-04-21 NOTE — Progress Notes (Signed)
Pt. Seen and examined. Agree with the NP/PA-C note as written.  No additional suggestions.  Chrystie Nose, MD, [facility] Attending Cardiologist [facility]

## 2012-04-21 NOTE — Progress Notes (Signed)
[facility]  Subjective: Doing well.  Ambulating this AM with RN.    Objective: Vital signs in last 24 hours: Temp:  [97.2 F (36.2 C)-99.5 F (37.5 C)] 98.1 F (36.7 C) (12/08 0832) Pulse Rate:  [83-90] 89  (12/08 0900) Resp:  [21-33] 26  (12/08 0900) BP: (84-129)/(51-71) 119/71 mmHg (12/08 0900) SpO2:  [97 %-100 %] 100 % (12/08 0900) Weight:  [87.7 kg (193 lb 5.5 oz)] 87.7 kg (193 lb 5.5 oz) (12/08 0500) Last BM Date: 04/15/12  Intake/Output from previous day: 12/07 0701 - 12/08 0700 In: 2646 [P.O.:1680; I.V.:466; IV Piggyback:500] Out: 1080 [Urine:930; Chest Tube:150] Intake/Output this shift: Total I/O In: 40 [I.V.:40] Out: -   Medications Current Facility-Administered Medications  Medication Dose Route Frequency Provider Last Rate Last Dose  . 0.9 %  sodium chloride infusion  250 mL Intravenous PRN Delight Ovens, MD      . acetaminophen (TYLENOL) tablet 650 mg  650 mg Oral Q6H PRN Delight Ovens, MD      . [EXPIRED] albumin human 5 % solution 250 mL  250 mL Intravenous Q15 min PRN Erin Barrett, PA   250 mL at 04/19/12 1600  . aspirin EC tablet 325 mg  325 mg Oral Daily Delight Ovens, MD   325 mg at 04/21/12 0916  . bisacodyl (DULCOLAX) EC tablet 10 mg  10 mg Oral Daily PRN Delight Ovens, MD   10 mg at 04/21/12 0915   Or  . bisacodyl (DULCOLAX) suppository 10 mg  10 mg Rectal Daily PRN Delight Ovens, MD      . docusate sodium (COLACE) capsule 200 mg  200 mg Oral Daily Delight Ovens, MD   200 mg at 04/21/12 0916  . enoxaparin (LOVENOX) injection 30 mg  30 mg Subcutaneous Q24H Delight Ovens, MD   30 mg at 04/20/12 1646  . furosemide (LASIX) tablet 40 mg  40 mg Oral Daily Delight Ovens, MD   40 mg at 04/21/12 0916  . insulin aspart (novoLOG) injection 0-24 Units  0-24 Units Subcutaneous TID AC & HS Delight Ovens, MD      . magnesium hydroxide (MILK OF MAGNESIA) suspension 30 mL  30 mL Oral Daily PRN Delight Ovens, MD      . metoprolol tartrate (LOPRESSOR) tablet 12.5 mg  12.5 mg Oral BID Delight Ovens, MD   12.5 mg at 04/21/12 0915  . [COMPLETED] moving right along book   Does not apply Once Delight Ovens, MD      . ondansetron [facility]) tablet 4 mg  4 mg Oral Q6H PRN Delight Ovens, MD       Or  . ondansetron [facility]) injection 4 mg  4 mg Intravenous Q6H PRN Delight Ovens, MD      . oxyCODONE (Oxy IR/ROXICODONE) immediate release tablet 5-10 mg  5-10 mg Oral Q3H PRN Delight Ovens, MD      . pantoprazole (PROTONIX) EC tablet 40 mg  40 mg Oral QAC breakfast Delight Ovens, MD   40 mg at 04/21/12 0843  . potassium chloride SA (K-DUR,KLOR-CON) CR tablet 20 mEq  20 mEq Oral Daily Delight Ovens, MD   20 mEq at 04/21/12 0916  . rosuvastatin (CRESTOR) tablet 20 mg  20 mg Oral q1800 Runell Gess, MD   20 mg at 04/20/12 1800  . sodium chloride 0.9 % injection 3 mL  3 mL Intravenous Q12H  Delight Ovens, MD   3 mL at 04/21/12 0900  . sodium chloride 0.9 % injection 3 mL  3 mL Intravenous PRN Delight Ovens, MD      . traMADol Janean Sark) tablet 50-100 mg  50-100 mg Oral Q4H PRN Delight Ovens, MD      . [DISCONTINUED] 0.45 % sodium chloride infusion   Intravenous Continuous Erin Barrett, PA 20 mL/hr at 04/19/12 1800    . [DISCONTINUED] 0.9 %  sodium chloride infusion   Intravenous Continuous Erin Barrett, PA 20 mL/hr at 04/20/12 1630    . [DISCONTINUED] 0.9 %  sodium chloride infusion  250 mL Intravenous Continuous Erin Barrett, PA 1 mL/hr at 04/20/12 0600 250 mL at 04/20/12 0600  . [DISCONTINUED] acetaminophen (TYLENOL) solution 975 mg  975 mg Per Tube Q6H Erin Barrett, PA      . [DISCONTINUED] acetaminophen (TYLENOL) tablet 1,000 mg  1,000 mg Oral Q6H Erin Barrett, PA   1,000 mg at 04/21/12 0643  . [DISCONTINUED] ALPRAZolam Prudy Feeler) tablet 0.25 mg  0.25 mg Oral TID PRN Abelino Derrick, PA      . [DISCONTINUED] aspirin chewable tablet 324 mg  324 mg Per Tube Daily Erin  Barrett, PA      . [DISCONTINUED] aspirin EC tablet 325 mg  325 mg Oral Daily Erin Barrett, PA   325 mg at 04/20/12 0954  . [DISCONTINUED] bisacodyl (DULCOLAX) EC tablet 10 mg  10 mg Oral Daily Erin Barrett, PA   10 mg at 04/20/12 0954  . [DISCONTINUED] bisacodyl (DULCOLAX) suppository 10 mg  10 mg Rectal Daily Erin Barrett, PA      . [DISCONTINUED] cefUROXime (ZINACEF) 1.5 g in dextrose 5 % 50 mL IVPB  1.5 g Intravenous Q12H Erin Barrett, PA   1.5 g at 04/20/12 2207  . [DISCONTINUED] dexmedetomidine (PRECEDEX) 200 MCG/50ML infusion  0.1-0.7 mcg/kg/hr Intravenous Continuous Erin Barrett, PA   0.2 mcg/kg/hr at 04/19/12 1500  . [DISCONTINUED] docusate sodium (COLACE) capsule 200 mg  200 mg Oral Daily Erin Barrett, PA   200 mg at 04/20/12 0953  . [DISCONTINUED] DOPamine (INTROPIN) 800 mg in dextrose 5 % 250 mL infusion  2-20 mcg/kg/min Intravenous Titrated Kerin Perna, MD 3 mL/hr at 04/19/12 1900 2 mcg/kg/min at 04/19/12 1900  . [DISCONTINUED] insulin aspart (novoLOG) injection 0-24 Units  0-24 Units Subcutaneous Q4H Kerin Perna, MD   2 Units at 04/21/12 0057  . [DISCONTINUED] insulin aspart (novoLOG) injection 0-24 Units  0-24 Units Subcutaneous Q4H Delight Ovens, MD      . [DISCONTINUED] insulin aspart (novoLOG) injection 2-6 Units  2-6 Units Subcutaneous Q4H Kerin Perna, MD      . [DISCONTINUED] insulin regular (NOVOLIN R,HUMULIN R) 1 Units/mL in sodium chloride 0.9 % 100 mL infusion   Intravenous Continuous Erin Barrett, PA   1.1 Units/hr at 04/19/12 1500  . [DISCONTINUED] insulin regular bolus via infusion 0-10 Units  0-10 Units Intravenous TID WC Erin Barrett, PA      . [DISCONTINUED] lactated ringers infusion   Intravenous Continuous Erin Barrett, PA 20 mL/hr at 04/19/12 1400    . [DISCONTINUED] metoprolol (LOPRESSOR) injection 2.5-5 mg  2.5-5 mg Intravenous Q2H PRN Erin Barrett, PA      . [DISCONTINUED] metoprolol tartrate (LOPRESSOR) 25 mg/10 mL oral suspension 12.5 mg  12.5 mg  Per Tube BID Erin Barrett, PA      . [DISCONTINUED] metoprolol tartrate (LOPRESSOR) tablet 12.5 mg  12.5 mg Oral BID Lowella Dandy, PA  12.5 mg at 04/20/12 2207  . [DISCONTINUED] midazolam (VERSED) injection 2 mg  2 mg Intravenous Q1H PRN Erin Barrett, PA      . [DISCONTINUED] morphine 2 MG/ML injection 2-5 mg  2-5 mg Intravenous Q1H PRN Erin Barrett, PA   2 mg at 04/19/12 1645  . [DISCONTINUED] multivitamin with minerals tablet 1 tablet  1 tablet Oral Daily Eda Paschal [place], [place]   1 tablet at 04/20/12 0954  . [DISCONTINUED] nitroGLYCERIN 0.2 mg/mL in dextrose 5 % infusion  0-100 mcg/min Intravenous Continuous Erin Barrett, PA   5 mcg/min at 04/19/12 1400  . [DISCONTINUED] ondansetron (ZOFRAN) injection 4 mg  4 mg Intravenous Q6H PRN Erin Barrett, PA      . [DISCONTINUED] oxyCODONE (Oxy IR/ROXICODONE) immediate release tablet 5-10 mg  5-10 mg Oral Q3H PRN Erin Barrett, PA   10 mg at 04/20/12 2213  . [DISCONTINUED] pantoprazole (PROTONIX) EC tablet 40 mg  40 mg Oral Daily Erin Barrett, PA      . [DISCONTINUED] phenylephrine (NEO-SYNEPHRINE) 20,000 mcg in dextrose 5 % 250 mL infusion  0-100 mcg/min Intravenous Continuous Erin Barrett, PA 3.8 mL/hr at 04/20/12 0400 5 mcg/min at 04/20/12 0400  . [DISCONTINUED] sodium chloride 0.9 % injection 3 mL  3 mL Intravenous Q12H Erin Barrett, PA   3 mL at 04/20/12 1000  . [DISCONTINUED] sodium chloride 0.9 % injection 3 mL  3 mL Intravenous PRN Erin Barrett, PA      . [DISCONTINUED] vancomycin (VANCOCIN) IVPB 1000 mg/200 mL premix  1,000 mg Intravenous Q12H Kerin Perna, MD   1,000 mg at 04/20/12 2303    PE: General appearance: alert, cooperative and no distress Lungs: clear to auscultation bilaterally Heart: regular rate and rhythm, S1, S2 normal, no murmur, click, rub or gallop Extremities: 2+ right LEE. 0 on the left.  Pulses: 2+ and symmetric Skin: warm and dry Neurologic: Grossly normal  Lab Results:   Basename 04/21/12 0325 04/20/12 1700 04/20/12  1654 04/20/12 0427  WBC 8.3 8.7 -- 7.0  HGB 8.8* 9.0* 9.2* --  HCT 25.7* 26.9* 27.0* --  PLT 121* 103* -- 94*   BMET  Basename 04/21/12 0325 04/20/12 1700 04/20/12 1654 04/20/12 0427 04/19/12 0455  NA 135 -- 138 140 --  K 3.8 -- 3.9 4.0 --  CL 103 -- 103 109 --  CO2 25 -- -- 24 25  GLUCOSE 119* -- 149* 125* --  BUN 25* -- 22 16 --  CREATININE 0.98 1.03 1.10 -- --  CALCIUM 7.9* -- -- 7.6* 9.2   PT/INR  Basename 04/19/12 1430  LABPROT 16.7*  INR 1.39    Assessment/Plan  Principal Problem:  *NSTEMI (non-ST elevated myocardial infarction) Active Problems:  Family history of coronary artery disease  Bradycardia, asymptomatic  Hyperlipidemia  CAD, multiple vessel  Plan:  POD 2  CABG x 5. Doing great.  Ambulating with RN.  A-pacing on tele with temp pacer.  BP and HR stable.  ASA, Lasix, lopressor 12.5mg  BID, Crestor.     LOS: 5 days    Icela Glymph 04/21/2012 9:35 AM

## 2012-04-21 NOTE — Anesthesia Postprocedure Evaluation (Signed)
  Anesthesia Post-op Note  Patient: Tyler Fox  Procedure(s) Performed: Procedure(s) (LRB) with comments: CORONARY ARTERY BYPASS GRAFTING (CABG) (N/A) - coronary artery bypass graft times five using left internal mammary artery and right leg saphenous vein INTRAOPERATIVE TRANSESOPHAGEAL ECHOCARDIOGRAM (N/A)  Patient Location: SICU  Anesthesia Type:General  Level of Consciousness: awake and sedated  Airway and Oxygen Therapy: Patient Spontanous Breathing and Patient connected to face mask oxygen  Post-op Pain: mild  Post-op Assessment: Post-op Vital signs reviewed, Patient's Cardiovascular Status Stable and Respiratory Function Stable  Post-op Vital Signs: stable  Complications: No apparent anesthesia complications

## 2012-04-21 NOTE — Progress Notes (Signed)
Patient ID: Tyler Fox, male   DOB: July 20, 1945, 65 y.o.   MRN: [id number] TCTS DAILY PROGRESS NOTE                   [address]            [name] [address]          [phone]      2 Days Post-Op Procedure(s) (LRB): CORONARY ARTERY BYPASS GRAFTING (CABG) (N/A) INTRAOPERATIVE TRANSESOPHAGEAL ECHOCARDIOGRAM (N/A)  Total Length of Stay:  LOS: 5 days   Subjective: Feels better this am, walked around the unit  Objective: Vital signs in last 24 hours: Temp:  [97.2 F (36.2 C)-99.5 F (37.5 C)] 99.5 F (37.5 C) (12/08 0310) Pulse Rate:  [83-90] 88  (12/08 0700) Cardiac Rhythm:  [-] Atrial paced (12/08 0700) Resp:  [21-33] 26  (12/08 0700) BP: (84-129)/(51-68) 118/68 mmHg (12/08 0700) SpO2:  [97 %-100 %] 100 % (12/08 0700) Weight:  [193 lb 5.5 oz (87.7 kg)] 193 lb 5.5 oz (87.7 kg) (12/08 0500)  Filed Weights   04/16/12 1100 04/20/12 0500 04/21/12 0500  Weight: 175 lb (79.379 kg) 189 lb 9.5 oz (86 kg) 193 lb 5.5 oz (87.7 kg)    Weight change: 3 lb 12 oz (1.7 kg)   Hemodynamic parameters for last 24 hours: PAP: (25)/(8) 25/8 mmHg CO:  [5.6 L/min] 5.6 L/min CI:  [2.8 L/min/m2] 2.8 L/min/m2  Intake/Output from previous day: 12/07 0701 - 12/08 0700 In: 2626 [P.O.:1680; I.V.:446; IV Piggyback:500] Out: 1080 [Urine:930; Chest Tube:150]  Intake/Output this shift:    Current Meds: Scheduled Meds:   . acetaminophen  1,000 mg Oral Q6H   Or  . acetaminophen (TYLENOL) oral liquid 160 mg/5 mL  975 mg Per Tube Q6H  . aspirin EC  325 mg Oral Daily   Or  . aspirin  324 mg Per Tube Daily  . bisacodyl  10 mg Oral Daily   Or  . bisacodyl  10 mg Rectal Daily  . cefUROXime (ZINACEF)  IV  1.5 g Intravenous Q12H  . docusate sodium  200 mg Oral Daily  . enoxaparin  30 mg Subcutaneous Q24H  . [COMPLETED] furosemide  20 mg Intravenous Once  . insulin aspart  2-6 Units Subcutaneous Q4H  . insulin regular  0-10 Units Intravenous TID WC  . metoprolol tartrate   12.5 mg Oral BID   Or  . metoprolol tartrate  12.5 mg Per Tube BID  . multivitamin with minerals  1 tablet Oral Daily  . pantoprazole  40 mg Oral Daily  . rosuvastatin  20 mg Oral q1800  . sodium chloride  3 mL Intravenous Q12H  . vancomycin  1,000 mg Intravenous Q12H  . [DISCONTINUED] insulin aspart  0-24 Units Subcutaneous Q4H  . [DISCONTINUED] insulin aspart  0-24 Units Subcutaneous Q4H   Continuous Infusions:   . sodium chloride 20 mL/hr at 04/19/12 1800  . sodium chloride 20 mL/hr at 04/20/12 1630  . sodium chloride 250 mL (04/20/12 0600)  . dexmedetomidine Stopped (04/19/12 1600)  . DOPamine 2 mcg/kg/min (04/19/12 1900)  . insulin (NOVOLIN-R) infusion Stopped (04/19/12 1600)  . lactated ringers 20 mL/hr at 04/19/12 1400  . nitroGLYCERIN Stopped (04/19/12 1500)  . phenylephrine (NEO-SYNEPHRINE) Adult infusion 5 mcg/min (04/20/12 0400)   PRN Meds:.[EXPIRED] albumin human, ALPRAZolam, metoprolol, midazolam, morphine injection, ondansetron (ZOFRAN) IV, oxyCODONE, sodium chloride  General appearance: alert and cooperative Neurologic: intact Heart: regular rate and rhythm, S1, S2 normal, no murmur,  click, rub or gallop, normal apical impulse and a paced  Lungs: clear to auscultation bilaterally and normal percussion bilaterally Abdomen: soft, non-tender; bowel sounds normal; no masses,  no organomegaly Extremities: extremities normal, atraumatic, no cyanosis or edema and Homans sign is negative, no sign of DVT Wound: sternum stable  Lab Results: CBC: Basename 04/21/12 0325 04/20/12 1700  WBC 8.3 8.7  HGB 8.8* 9.0*  HCT 25.7* 26.9*  PLT 121* 103*   BMET:  Basename 04/21/12 0325 04/20/12 1700 04/20/12 1654 04/20/12 0427  NA 135 -- 138 --  K 3.8 -- 3.9 --  CL 103 -- 103 --  CO2 25 -- -- 24  GLUCOSE 119* -- 149* --  BUN 25* -- 22 --  CREATININE 0.98 1.03 -- --  CALCIUM 7.9* -- -- 7.6*    PT/INR:  Basename 04/19/12 1430  LABPROT 16.7*  INR 1.39   Radiology: Dg  Chest Portable 1 View In Am  04/20/2012  *RADIOLOGY REPORT*  Clinical Data: Postop.  PORTABLE CHEST - 1 VIEW  Comparison: 04/19/2012  Findings: Stable position of the chest drains.  Swan-Ganz catheter in the proximal right pulmonary artery.  Volume loss in the left hemithorax.  There is no significant pneumothorax.  The right lung is clear. Endotracheal tube and nasogastric tube have been removed.  IMPRESSION: Volume loss in the left lung.  Chest tubes without a pneumothorax.   Original Report Authenticated By: Richarda Overlie, M.D.    Dg Chest Portable 1 View  04/19/2012  *RADIOLOGY REPORT*  Clinical Data: Post CABG  PORTABLE CHEST - 1 VIEW  Comparison: 04/18/2012  Findings: The patient is status post median sternotomy. Endotracheal tube in place with tip 4.5 cm above the carina.  Right IJ Swan-Ganz catheter in place.  NG tube in place.  There is a left chest tube with tip in the left apical region.  No diagnostic pneumothorax.  No pulmonary edema.  NG tube in place.  IMPRESSION: Status post CABG.  No acute infiltrate or pulmonary edema.  No diagnostic pneumothorax.  Support apparatus as described above.   Original Report Authenticated By: Natasha Mead, M.D.      Assessment/Plan: S/P Procedure(s) (LRB): CORONARY ARTERY BYPASS GRAFTING (CABG) (N/A) INTRAOPERATIVE TRANSESOPHAGEAL ECHOCARDIOGRAM (N/A) Mobilize Diuresis d/c tubes/lines Plan for transfer to step-down: see transfer orders D/c foley Wt still up    Tyler Fox 04/21/2012 7:37 AM

## 2012-04-22 ENCOUNTER — Inpatient Hospital Stay (HOSPITAL_COMMUNITY): Payer: BC Managed Care – PPO

## 2012-04-22 LAB — CBC
HCT: 24.9 % — ABNORMAL LOW (ref 39.0–52.0)
Hemoglobin: 8.6 g/dL — ABNORMAL LOW (ref 13.0–17.0)
MCH: 27.7 pg (ref 26.0–34.0)
MCHC: 34.5 g/dL (ref 30.0–36.0)
MCV: 80.1 fL (ref 78.0–100.0)
Platelets: 118 10*3/uL — ABNORMAL LOW (ref 150–400)
RBC: 3.11 MIL/uL — ABNORMAL LOW (ref 4.22–5.81)
RDW: 13.5 % (ref 11.5–15.5)
WBC: 6.9 10*3/uL (ref 4.0–10.5)

## 2012-04-22 LAB — BASIC METABOLIC PANEL
BUN: 24 mg/dL — ABNORMAL HIGH (ref 6–23)
CO2: 25 mEq/L (ref 19–32)
Calcium: 8.1 mg/dL — ABNORMAL LOW (ref 8.4–10.5)
Chloride: 102 mEq/L (ref 96–112)
Creatinine, Ser: 0.89 mg/dL (ref 0.50–1.35)
GFR calc Af Amer: >90 mL/min (ref >90)
GFR calc non Af Amer: 86 mL/min — ABNORMAL LOW (ref >90)
Glucose, Bld: 111 mg/dL — ABNORMAL HIGH (ref 70–99)
Potassium: 3.8 mEq/L (ref 3.5–5.1)
Sodium: 135 mEq/L (ref 135–145)

## 2012-04-22 LAB — GLUCOSE, CAPILLARY
Glucose-Capillary: 109 mg/dL — ABNORMAL HIGH (ref 70–99)
Glucose-Capillary: 103 mg/dL — ABNORMAL HIGH (ref 70–99)

## 2012-04-22 MED FILL — Magnesium Sulfate Inj 50%: INTRAMUSCULAR | Qty: 10 | Status: AC

## 2012-04-22 MED FILL — Potassium Chloride Inj 2 mEq/ML: INTRAVENOUS | Qty: 40 | Status: AC

## 2012-04-22 NOTE — Progress Notes (Signed)
[facility]  Subjective: Doing well ambulating  Objective: Vital signs in last 24 hours: Temp:  [98.3 F (36.8 C)-99.1 F (37.3 C)] 99.1 F (37.3 C) (12/09 3086) Pulse Rate:  [75-91] 84  (12/09 0934) Resp:  [16-31] 20  (12/09 0613) BP: (108-140)/(58-66) 140/64 mmHg (12/09 0934) SpO2:  [96 %-100 %] 96 % (12/09 0613) Weight:  [87.907 kg (193 lb 12.8 oz)] 87.907 kg (193 lb 12.8 oz) (12/09 5784) Last BM Date:  (pre op)  Intake/Output from previous day: 12/08 0701 - 12/09 0700 In: 820 [P.O.:720; I.V.:100] Out: 396 [Urine:396] Intake/Output this shift:    Medications Current Facility-Administered Medications  Medication Dose Route Frequency Provider Last Rate Last Dose  . 0.9 %  sodium chloride infusion  250 mL Intravenous PRN Delight Ovens, MD      . acetaminophen (TYLENOL) tablet 650 mg  650 mg Oral Q6H PRN Delight Ovens, MD      . aspirin EC tablet 325 mg  325 mg Oral Daily Delight Ovens, MD   325 mg at 04/22/12 0935  . bisacodyl (DULCOLAX) EC tablet 10 mg  10 mg Oral Daily PRN Delight Ovens, MD   10 mg at 04/22/12 0557   Or  . bisacodyl (DULCOLAX) suppository 10 mg  10 mg Rectal Daily PRN Delight Ovens, MD      . docusate sodium (COLACE) capsule 200 mg  200 mg Oral Daily Delight Ovens, MD   200 mg at 04/22/12 0935  . enoxaparin (LOVENOX) injection 30 mg  30 mg Subcutaneous Q24H Delight Ovens, MD   30 mg at 04/21/12 1717  . furosemide (LASIX) tablet 40 mg  40 mg Oral Daily Delight Ovens, MD   40 mg at 04/22/12 0935  . insulin aspart (novoLOG) injection 0-24 Units  0-24 Units Subcutaneous TID AC & HS Delight Ovens, MD   2 Units at 04/21/12 2241  . magnesium hydroxide (MILK OF MAGNESIA) suspension 30 mL  30 mL Oral Daily PRN Delight Ovens, MD      . metoprolol tartrate (LOPRESSOR) tablet 12.5 mg  12.5 mg Oral BID Delight Ovens, MD   12.5 mg at 04/22/12 0935  . ondansetron (ZOFRAN) tablet 4 mg  4 mg Oral Q6H  PRN Delight Ovens, MD       Or  . ondansetron [facility]) injection 4 mg  4 mg Intravenous Q6H PRN Delight Ovens, MD      . oxyCODONE (Oxy IR/ROXICODONE) immediate release tablet 5-10 mg  5-10 mg Oral Q3H PRN Delight Ovens, MD   10 mg at 04/21/12 2021  . pantoprazole (PROTONIX) EC tablet 40 mg  40 mg Oral QAC breakfast Delight Ovens, MD   40 mg at 04/22/12 0753  . potassium chloride SA (K-DUR,KLOR-CON) CR tablet 20 mEq  20 mEq Oral Daily Delight Ovens, MD   20 mEq at 04/22/12 0935  . rosuvastatin (CRESTOR) tablet 20 mg  20 mg Oral q1800 Runell Gess, MD   20 mg at 04/21/12 1716  . sodium chloride 0.9 % injection 3 mL  3 mL Intravenous Q12H Delight Ovens, MD   3 mL at 04/22/12 1000  . sodium chloride 0.9 % injection 3 mL  3 mL Intravenous PRN Delight Ovens, MD      . traMADol Janean Sark) tablet 50-100 mg  50-100 mg Oral Q4H PRN Delight Ovens, MD  PE: General appearance: alert, cooperative and no distress Lungs: clear to auscultation bilaterally; decreased at r base Heart: regular rate and rhythm, S1, S2 normal, no murmur, click, rub or gallop Extremities: 2+ right LEE Pulses: 2+ and symmetric Skin: warm and dry.  Slight drainage from the right LE Neurologic: Grossly normal  Lab Results:   Basename 04/22/12 0556 04/21/12 0325 04/20/12 1700  WBC 6.9 8.3 8.7  HGB 8.6* 8.8* 9.0*  HCT 24.9* 25.7* 26.9*  PLT 118* 121* 103*   BMET  Basename 04/22/12 0556 04/21/12 0325 04/20/12 1700 04/20/12 1654 04/20/12 0427  NA 135 135 -- 138 --  K 3.8 3.8 -- 3.9 --  CL 102 103 -- 103 --  CO2 25 25 -- -- 24  GLUCOSE 111* 119* -- 149* --  BUN 24* 25* -- 22 --  CREATININE 0.89 0.98 1.03 -- --  CALCIUM 8.1* 7.9* -- -- 7.6*   PT/INR  Basename 04/19/12 1430  LABPROT 16.7*  INR 1.39    Studies/Results: CHEST - 2 VIEW  Comparison: 04/21/2012  Findings: The left chest tube has been removed. No pneumothorax  identified. Persistent left lower lobe atelectasis is  again  demonstrated. Small right pleural effusion is stable. Right lung  is otherwise clear. The heart size and mediastinal contours are  also stable. The right jugular Cordis has been removed.  IMPRESSION:  1. No evidence of pneumothorax following left chest tube and right  jugular Cordis removal.  2. Persistent left basilar atelectasis and small right pleural  effusion.  Assessment/Plan  Principal Problem:  *NSTEMI (non-ST elevated myocardial infarction) Active Problems:  Family history of coronary artery disease  Bradycardia, asymptomatic  Hyperlipidemia  CAD, multiple vessel  Plan:  POD 3 CABG x 5. Doing great. Ambulating with RN.  BP and HR stable. ASA, Lasix, lopressor 12.5mg  BID, Crestor.    LOS: 6 days    HAGER, BRYAN 04/22/2012 10:18 AM   Patient seen and examined. Agree with assessment and plan. Stable hemodynamics. Small R pleural effusion and mild edema, continue diuresis. Cardiac Rehab. Consider emperic iron post op with anemia.   Lennette Bihari, MD, [facility] 04/22/2012 10:23 AM

## 2012-04-22 NOTE — Progress Notes (Addendum)
[address]            [place] [address]          [phone]     3 Days Post-Op  Procedure(s) (LRB): CORONARY ARTERY BYPASS GRAFTING (CABG) (N/A) INTRAOPERATIVE TRANSESOPHAGEAL ECHOCARDIOGRAM (N/A) Subjective: Feels well generally  Objective  Telemetry sinus rhythm  Temp:  [98.3 F (36.8 C)-99.1 F (37.3 C)] 99.1 F (37.3 C) (12/09 4010) Pulse Rate:  [73-91] 79  (12/09 0613) Resp:  [16-31] 20  (12/09 0613) BP: (97-130)/(58-71) 124/60 mmHg (12/09 0613) SpO2:  [96 %-100 %] 96 % (12/09 0613) Weight:  [193 lb 12.8 oz (87.907 kg)] 193 lb 12.8 oz (87.907 kg) (12/09 2725)   Intake/Output Summary (Last 24 hours) at 04/22/12 0847 Last data filed at 04/21/12 2300  Gross per 24 hour  Intake    800 ml  Output    371 ml  Net    429 ml       General appearance: alert, cooperative and no distress Heart: regular rate and rhythm Lungs: mildly dim in bases Abdomen: + BS, soft, non-tender Extremities: + edema' Wound: incisions healing well  Lab Results:  Basename 04/22/12 0556 04/21/12 0325 04/20/12 1700 04/20/12 0427  NA 135 135 -- --  K 3.8 3.8 -- --  CL 102 103 -- --  CO2 25 25 -- --  GLUCOSE 111* 119* -- --  BUN 24* 25* -- --  CREATININE 0.89 0.98 -- --  CALCIUM 8.1* 7.9* -- --  MG -- -- 2.9* 2.9*  PHOS -- -- -- --   No results found for this basename: AST:2,ALT:2,ALKPHOS:2,BILITOT:2,PROT:2,ALBUMIN:2 in the last 72 hours No results found for this basename: LIPASE:2,AMYLASE:2 in the last 72 hours  Basename 04/22/12 0556 04/21/12 0325  WBC 6.9 8.3  NEUTROABS -- --  HGB 8.6* 8.8*  HCT 24.9* 25.7*  MCV 80.1 80.8  PLT 118* 121*   No results found for this basename: CKTOTAL:4,CKMB:4,TROPONINI:4 in the last 72 hours No components found with this basename: POCBNP:3 No results found for this basename: DDIMER in the last 72 hours No results found for this basename: HGBA1C in the last 72 hours No results found for this basename:  CHOL,HDL,LDLCALC,TRIG,CHOLHDL in the last 72 hours No results found for this basename: TSH,T4TOTAL,FREET3,T3FREE,THYROIDAB in the last 72 hours No results found for this basename: VITAMINB12,FOLATE,FERRITIN,TIBC,IRON,RETICCTPCT in the last 72 hours  Medications: Scheduled    . aspirin EC  325 mg Oral Daily  . docusate sodium  200 mg Oral Daily  . enoxaparin  30 mg Subcutaneous Q24H  . furosemide  40 mg Oral Daily  . insulin aspart  0-24 Units Subcutaneous TID AC & HS  . metoprolol tartrate  12.5 mg Oral BID  . pantoprazole  40 mg Oral QAC breakfast  . potassium chloride  20 mEq Oral Daily  . rosuvastatin  20 mg Oral q1800  . sodium chloride  3 mL Intravenous Q12H     Radiology/Studies:  Dg Chest 2 View  04/22/2012  *RADIOLOGY REPORT*  Clinical Data: Postop from CABG.  CHEST - 2 VIEW  Comparison: 04/21/2012  Findings: The left chest tube has been removed.  No pneumothorax identified.  Persistent left lower lobe atelectasis is again demonstrated.  Small right pleural effusion is stable.  Right lung is otherwise clear.  The heart size and mediastinal contours are also stable.  The right jugular Cordis has  been removed.  IMPRESSION:  1.  No evidence of pneumothorax following left chest tube and right jugular Cordis removal. 2.  Persistent left basilar atelectasis and small right pleural effusion.   Original Report Authenticated By: Myles Rosenthal, M.D.    Dg Chest Portable 1 View In Am  04/21/2012  *RADIOLOGY REPORT*  Clinical Data: 66 year old male - postoperative day #2 CABG.  PORTABLE CHEST - 1 VIEW  Comparison: 04/20/2012 and prior chest radiographs dating back to 11/29/2009  Findings: Cardiomegaly and CABG changes again noted. A Swan-Ganz catheter has been removed with right IJ venous catheter sheath noted with tip overlying the upper SVC. A mediastinal catheter has been removed. A left thoracostomy tube is again noted.  There is no evidence of pneumothorax. Left basilar atelectasis is again  identified. No other changes are identified.  IMPRESSION: Support apparatus removal, otherwise unchanged chest radiograph. Continued left basilar atelectasis.  No evidence of pneumothorax.   Original Report Authenticated By: Harmon Pier, M.D.     INR: Will add last result for INR, ABG once components are confirmed Will add last 4 CBG results once components are confirmed  Assessment/Plan: S/P Procedure(s) (LRB): CORONARY ARTERY BYPASS GRAFTING (CABG) (N/A) INTRAOPERATIVE TRANSESOPHAGEAL ECHOCARDIOGRAM (N/A)  1. Overall doing well 2 cont pulm toilet/ rehab 3 cont diuresis 4 cbg's good control    LOS: 6 days    Tyler Fox,Tyler Fox 12/9/20138:47 AM     Chart reviewed, patient examined, agree with above. He still has lower extremity edema and a small right effusion on cxr. Continue diuresis.

## 2012-04-22 NOTE — Progress Notes (Signed)
04/22/2012 3:20 PM Nursing note Pt. Ambulated independently 550 ft around unit. Pt. Tolerated well. Encouraged one more walk this evening.  Vittoria Noreen, Blanchard Kelch

## 2012-04-22 NOTE — Progress Notes (Signed)
CARDIAC REHAB PHASE I   PRE:  Rate/Rhythm: 85 SR  BP:  Supine:   Sitting: 120/60  Standing:    SaO2: 96 RA  MODE:  Ambulation: 550 ft   POST:  Rate/Rhythem: 103 ST  BP:  Supine:   Sitting: 140/64  Standing:    SaO2: 93 RA 0855-0940 Assisted x 1 with hand held assist to ambulate. Gait steady VS stable. Pt's only c/o is of need to have BM. Pt back to recliner after walk with call light in reach.  Tyler Fox

## 2012-04-23 ENCOUNTER — Encounter (HOSPITAL_COMMUNITY): Payer: Self-pay | Admitting: Cardiology

## 2012-04-23 DIAGNOSIS — Z951 Presence of aortocoronary bypass graft: Secondary | ICD-10-CM

## 2012-04-23 DIAGNOSIS — D62 Acute posthemorrhagic anemia: Secondary | ICD-10-CM | POA: Diagnosis not present

## 2012-04-23 HISTORY — DX: Presence of aortocoronary bypass graft: Z95.1

## 2012-04-23 LAB — GLUCOSE, CAPILLARY
Glucose-Capillary: 102 mg/dL — ABNORMAL HIGH (ref 70–99)
Glucose-Capillary: 108 mg/dL — ABNORMAL HIGH (ref 70–99)

## 2012-04-23 MED ORDER — BENAZEPRIL HCL 5 MG PO TABS
5.0000 mg | ORAL_TABLET | Freq: Every day | ORAL | Status: DC
  Filled 2012-04-23 (×3): qty 1

## 2012-04-23 MED ORDER — TRAMADOL HCL 50 MG PO TABS: 50.0000 mg | ORAL_TABLET | ORAL | Status: DC | PRN

## 2012-04-23 MED ORDER — ASPIRIN EC 81 MG PO TBEC
81.0000 mg | DELAYED_RELEASE_TABLET | Freq: Every day | ORAL | Status: DC
  Administered 2012-04-23: 81 mg via ORAL
  Filled 2012-04-23 (×2): qty 1

## 2012-04-23 MED ORDER — METOPROLOL TARTRATE 12.5 MG HALF TABLET: 12.5000 mg | ORAL_TABLET | Freq: Two times a day (BID) | ORAL | Status: DC

## 2012-04-23 MED ORDER — CLOPIDOGREL BISULFATE 75 MG PO TABS
75.0000 mg | ORAL_TABLET | Freq: Every day | ORAL | Status: DC
  Filled 2012-04-23 (×2): qty 1

## 2012-04-23 MED ORDER — METOPROLOL TARTRATE 25 MG PO TABS
25.0000 mg | ORAL_TABLET | Freq: Two times a day (BID) | ORAL | Status: DC
  Administered 2012-04-23: 25 mg via ORAL
  Filled 2012-04-23 (×3): qty 1

## 2012-04-23 MED ORDER — ASPIRIN 325 MG PO TBEC: 325.0000 mg | DELAYED_RELEASE_TABLET | Freq: Every day | ORAL | Status: DC

## 2012-04-23 MED ORDER — ROSUVASTATIN CALCIUM 20 MG PO TABS: 20.0000 mg | ORAL_TABLET | Freq: Every day | ORAL | Status: DC

## 2012-04-23 MED ORDER — POTASSIUM CHLORIDE CRYS ER 20 MEQ PO TBCR: 20.0000 meq | EXTENDED_RELEASE_TABLET | Freq: Every day | ORAL | Status: DC

## 2012-04-23 MED ORDER — FUROSEMIDE 40 MG PO TABS: 40.0000 mg | ORAL_TABLET | Freq: Every day | ORAL | Status: DC

## 2012-04-23 MED ORDER — OXYCODONE HCL 5 MG PO TABS: 5.0000 mg | ORAL_TABLET | ORAL | Status: DC | PRN

## 2012-04-23 NOTE — Progress Notes (Signed)
EPWs/CTs removed per MD order and protocol, wire ends intact. Pt tolerated procedure well. Pt resting in bed X 1 hr. Will continue to monitor.

## 2012-04-23 NOTE — Progress Notes (Signed)
4 Days Post-Op Procedure(s) (LRB):  CORONARY ARTERY BYPASS GRAFTING (CABG) (N/A)  INTRAOPERATIVE TRANSESOPHAGEAL ECHOCARDIOGRAM    Subjective: Complains of dreams where the monitor has arms and legs.  Also complains of Rt. Thigh discomfort above the VG harvesting site.    Objective: Vital signs in last 24 hours: Temp:  [98.6 F (37 C)-98.8 F (37.1 C)] 98.6 F (37 C) (12/10 0427) Pulse Rate:  [83-87] 83  (12/10 0427) Resp:  [18-20] 18  (12/10 0427) BP: (115-140)/(56-64) 125/62 mmHg (12/10 0427) SpO2:  [98 %-99 %] 98 % (12/10 0427) Weight:  [86.41 kg (190 lb 8 oz)] 86.41 kg (190 lb 8 oz) (12/10 0427) Weight change: -1.497 kg (-3 lb 4.8 oz) Last BM Date: 04/22/12 Intake/Output from previous day: +478 12/09 0701 - 12/10 0700 In: 480 [P.O.:480] Out: 2 [Urine:2] Intake/Output this shift:    PE: General: alert and oriented, pleasant affect Heart:S1S2 RRR Lungs:clear ant. Abd:+ BS, soft, non tender Ext:Rt thigh medical aspect, warm to touch and tender, 1+ lower ext edema    Lab Results:  [facility] 04/22/12 0556 04/21/12 0325  WBC 6.9 8.3  HGB 8.6* 8.8*  HCT 24.9* 25.7*  PLT 118* 121*   BMET  Basename 04/22/12 0556 04/21/12 0325  NA 135 135  K 3.8 3.8  CL 102 103  CO2 25 25  GLUCOSE 111* 119*  BUN 24* 25*  CREATININE 0.89 0.98  CALCIUM 8.1* 7.9*   No results found for this basename: TROPONINI:2,CK,MB:2 in the last 72 hours  Lab Results  Component Value Date   CHOL 266* 04/16/2012   HDL 60 04/16/2012   LDLCALC 183* 04/16/2012   TRIG 113 04/16/2012   CHOLHDL 4.4 04/16/2012   Lab Results  Component Value Date   HGBA1C 5.8* 04/17/2012     No results found for this basename: TSH     Studies/Results: Dg Chest 2 View  04/22/2012  *RADIOLOGY REPORT*  Clinical Data: Postop from CABG.  CHEST - 2 VIEW  Comparison: 04/21/2012  Findings: The left chest tube has been removed.  No pneumothorax identified.  Persistent left lower lobe atelectasis is again demonstrated.   Small right pleural effusion is stable.  Right lung is otherwise clear.  The heart size and mediastinal contours are also stable.  The right jugular Cordis has been removed.  IMPRESSION:  1.  No evidence of pneumothorax following left chest tube and right jugular Cordis removal. 2.  Persistent left basilar atelectasis and small right pleural effusion.   Original Report Authenticated By: Myles Rosenthal, M.D.     Medications: I have reviewed the patient's current medications.    Marland Kitchen aspirin EC  325 mg Oral Daily  . docusate sodium  200 mg Oral Daily  . enoxaparin  30 mg Subcutaneous Q24H  . furosemide  40 mg Oral Daily  . insulin aspart  0-24 Units Subcutaneous TID AC & HS  . metoprolol tartrate  12.5 mg Oral BID  . pantoprazole  40 mg Oral QAC breakfast  . potassium chloride  20 mEq Oral Daily  . rosuvastatin  20 mg Oral q1800  . 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 Hallucinations no pain meds last  pm except plain tylenol, no sleeping pill.  Rt. Thigh pain, warm to touch, discussed with surgical PA-he has had hematoma since surgery, he oozed with the procedure.   LOS: 7 days   INGOLD,LAURA R 04/23/2012, 8:08 AM  I have seen and evaluated the patient this morning along with Nada Boozer, NP. I agree with his/her findings, examination as well as impression recommendations.  Progressing as expected -- hallucinations last PM are concerning - ? delirium from poor sleep.  Groin hematoma care per CT Sgx.  BP up a bit & with HR in 80s, will titrate up Metoprolol to 25 mg bid.  ASA + Plavix for ACS  presentation. Statin.  Anticipate d/c in AM.  Marykay Lex, M.D., M.S. [facility] [address] [place], [place]  [address]  [phone] Pager # [phone] 04/23/2012 11:52 AM

## 2012-04-23 NOTE — Discharge Summary (Signed)
[address]            [place] [address]          [phone]      TC KAPUSTA 1945-06-27 66 y.o. [id number]  04/16/2012   Kerin Perna, MD  NSTEMI (non-ST elevated myocardial infarction) [410.70] HCAP healthcare-associated pneumonia 213086 cp CAD   HPI:  This is a 66 y.o. male who presented to the emergency department on the date of admission with complaints of epigastric discomfort described as "indigestion". His initial troponin was negative. His initial EKG showed no acute changes. His second troponin was positive at 0.27, and a second EKG did reveal some T wave inversion in lead V5 that appeared new. He initially did have some episodes of asymptomatic bradycardia. He was seen in cardiology consultation by Dr. Nanetta Batty and admitted to the hospital for further evaluation and treatment. He was placed on intravenous nitroglycerin as well as heparin, started on aspirin and PPI, and admitted to the step down unit. Problem List:  History reviewed. No pertinent past medical history.  Past Surgical History   Procedure  Date   .  Appendectomy     Allergies: No Known Allergies  Home Medications  See Med Rec  No family history on file.  History    Social History   .  Marital Status:  Married     Spouse Name:  N/A     Number of Children:  N/A   .  Years of Education:  N/A    Occupational History   .  Not on file.    Social History Main Topics   .  Smoking status:  Not on file   .  Smokeless tobacco:  Not on file   .  Alcohol Use:    .  Drug Use:    .  Sexually Active:     Other Topics  Concern   .  Not on file    Social History Narrative   .  No narrative on file      Hospital Course:  The patient did rule in for NSTEMI and scheduled for cardiac catheterization. This was done on 04/17/2012 and the following results were noted: CARDIAC CATHETERIZATION REPORT  NAME: Tyler Fox MRN: [id number]  DOB: 02-11-1946 ADMIT  DATE: 04/16/2012  Procedure Date: 04/17/2012  INTERVENTIONAL CARDIOLOGIST: Marykay Lex, M.D., MS  PRIMARY CARE PROVIDER: Roque Lias, MD  PRIMARY CARDIOLOGIST: Runell Gess, MD  PATIENT: Tyler Fox is a 66 y.o. 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  Stabilized overnight on IV Heparin & NTG gtt. He is referred for invasive coronary evaluation.  PRE-OPERATIVE DIAGNOSIS:  NSTEMI PROCEDURES PERFORMED:  Left Heart Catheterization and Native Coronary Angiography using 5 Fr Right Radial Artery Access.  Left Ventriculography from RAO projection  PROCEDURE:  Consent: Risks of procedure as well as the alternatives and risks of each were explained to the (patient/caregiver). Consent for procedure obtained.  Consent for signed by MD and patient with RN witness -- placed on chart.  PROCEDURE: The patient was brought to the 2nd Floor [facility] in the fasting state and prepped and draped in the usual sterile fashion for Right  Radial access, after a Modified Allen's test with plethysmography revealed excellent Ulnar Artery collateralization. Sterile technique was used including antiseptics, cap, gloves, gown, hand hygiene, mask and sheet. Skin prep: Chlorhexidine  Time Out: Verified patient identification, verified procedure, site/side was marked, verified correct patient position, special equipment/implants available, medications/allergies/relevent history reviewed, required imaging and test results available. Performed.  Access: Right Radial Artery; 5 Fr Glide Sheath; Seldinger technique using the Angiocath Micropuncture Kit  Diagnostic: 5Fr TIG 4.0 -- advanced over Versicore wire and exchanged over Long-Exchange Safety-J wire.  Left and Right Coronary Artery Angiography: TIG 4.0  LV Hemodynamics  (LV Gram): Angles pigtail. Catheter removed completely out of the body over Versicore wire.  TR Band: 11 Hours, 1120 mL air; non-occlusive hemostasis.  EBL: < 5 ml  ANESTHESIA: Local Lidocaine 4 ml  SEDATION: 2 mg IV Versed, 50 mcg IV fentanyl ; Premedication: 5 mg PO Valium  MEDICATIONS: Omnipaque Contrast: 90ml  Anticoagulation: IV Heparin 4000 Units  Radial Cocktail: 5 mg Verapamil, 400 mcg NTG, 2 ml 2% Lidocaine in 10 ml NS - Intra Arterial via sheath  Hemodynamics:  Central Aortic / Mean Pressures: 125/65 mmHg; 92 mmHg  Left Ventricular Pressures / EDP: 125/11 mmHg; 25-28 mmHg Left Ventriculography:  EF: 55 %  Wall Motion: mild basal inferior hypokinesis Coronary Anatomy: See written note scanned in chart.  Left Main: Large caliber vessel that bifurcates normally into LAD and Circumflex LAD: Large caliber vessel with tandem ~70-80% lesions in the proximal vessel prior to a large Septal Perforator & First Diagonal (D1) followed by at 1-2 additional focal lesions involving the LAD-D1 bifurcation. Beyond D2, the vessel grows to a Large caliber vessel that wraps the apex with mild luminal irregularities.  D1 & 2: Small to moderate caliber branches that are both relatively proximal vessels perfusing the basal-mid anterolateral wall; D1 is partially involved in the parent LAD lesion, but with <50% stenosis, otherwise both vessels are angiographically normal. Left Circumflex: ~90 degree takeoff with a short proximal stem before bifurcating into moderate to large caliber OM1 and moderate caliber AV-Groove Circumflex (AVG Cx), ~50-60% stenosis pre-bifurcation with ~60-70% proximal OM1 and ~60% proximal AVG Cx. The AVG Cx gives off a small OM2 then terminates as two small caliber LPL branches with a ~50% stenosis  OM1: Moderate to large caliber vessel with minimal luminal irregularities after the proximal lesion.  OM 2: small caliber, mild luminal irregularities RCA: Large caliber dominant vessel,  ~30-40% stenosis just  RPDA: Fills via R-R & LAD Septal collaterals  RPL Sysytem:The Right Posterior AV Groove Branch Fills via retrograde flow from PDA collaterals as well as some mild LCx-PL collaterals PATIENT DISPOSITION:  The patient was transferred to the PACU holding area in a hemodynamicaly stable, chest pain free condition.  The patient tolerated the procedure well, and there were no complications.  The patient was stable before, during, and after the procedure. POST-OPERATIVE DIAGNOSIS:  Multivessel CAD with mid 80% to 100% distal RCA occlusion and tandem 70-80% proximal to mid LAD stenoses along with moderate to severe ~60-70% bifurcation disease in Circumflex-OM1.  Well preserved EF with elevated LVEDP. PLAN OF CARE:  Post cath care -- gentle diuresis  Restart Heparin post TR band removal (6hrs)  CT Surgery consult for CABG consideration; patient has not been on a Thienopyridine.  Add statin HARDING,DAVID W, M.D., M.S.  [facility]  [address]  [place], [place] [address]  [phone]    cardiothoracic surgical consultation was  then obtained with Kerin Perna MD who evaluated the patient and studies and agree with recommendations to proceed with coronary artery surgical revascularization. The procedure was scheduled and on 04/19/2012 he was taken to the operating room where he underwent the following procedure:  OPERATIVE REPORT  OPERATION:  1. Coronary artery bypass grafting x5 (left internal mammary artery  left anterior descending, saphenous vein graft to diagonal,  saphenous vein graft to obtuse marginal, saphenous vein graft to  distal circumflex, saphenous vein graft to posterior descending.  2. Endoscopic harvest of right leg greater saphenous vein, exposure of  left leg greater saphenous vein, but not harvested due to severe  dilatation.  PREOPERATIVE DIAGNOSIS: Non ST-elevation myocardial infarction with  unstable  angina, three-vessel coronary disease.  POSTOPERATIVE DIAGNOSIS: Non ST-elevation myocardial infarction with  unstable angina, three-vessel coronary disease.  SURGEON: Kerin Perna, M.D.  ASSISTANT: Pauline Good, PA-C.  ANESTHESIA: General by Dr. Burna Forts.  The patient tolerated the procedure well was taken to the surgical intensive care unit in stable condition   Postoperative hospital course: The patient has overall progressed quite nicely. He was weaned from the ventilator without difficulty. All routine lines, monitors and drainage devices have been discontinued in the standard fashion. He does have an expected acute blood loss anemia and values have stabilized. He also has had a mild thrombocytopenia which also has stabilized. He had a moderate volume overload which is responding to diuretics. He is tolerating gradually increasing activities using standard protocols. Incisions are healing well without evidence of infection. He had no significant postoperative cardiac dysrhythmias. Overall his status is felt to be tentatively stable for discharge in the next 24-48 hours pending ongoing reevaluation of his recovery.     Basename 04/22/12 0556 04/21/12 0325  NA 135 135  K 3.8 3.8  CL 102 103  CO2 25 25  GLUCOSE 111* 119*  BUN 24* 25*  CALCIUM 8.1* 7.9*    Basename 04/22/12 0556 04/21/12 0325  WBC 6.9 8.3  HGB 8.6* 8.8*  HCT 24.9* 25.7*  PLT 118* 121*   No results found for this basename: INR:2 in the last 72 hours   Discharge Instructions:  The patient is discharged to home with extensive instructions on wound care and progressive ambulation.  They are instructed not to drive or perform any heavy lifting until returning to see the physician in his office.  Discharge Diagnosis:  NSTEMI (non-ST elevated myocardial infarction) [410.70] HCAP healthcare-associated pneumonia 161096 cp CAD  Secondary Diagnosis: Patient Active Problem List  Diagnosis  . NSTEMI  (non-ST elevated myocardial infarction)  . Family history of coronary artery disease  . Bradycardia, asymptomatic  . Hyperlipidemia  . CAD, multiple vessel  . 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 Anemia associated with acute blood loss, secondary to surgery   Past Medical History  Diagnosis Date  . No pertinent past medical history   . NSTEMI (non-ST elevated myocardial infarction) 04/16/12  . 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 Hyperlipidemia 04/17/2012  . 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      benazepril  10 MG tablet   Commonly known as: LOTENSIN   Take 1 tablet (10 mg total) by mouth daily.      CINNAMON PO   Take 1 tablet by mouth daily.      clopidogrel 75 MG tablet   Commonly known as: PLAVIX   Take 1 tablet (75 mg total) by mouth daily with breakfast.      FISH OIL PO   Take 1 tablet by mouth daily.      furosemide 40 MG tablet   Commonly known as: LASIX   Take 1 tablet (40 mg total) by mouth daily. For 7 days then stop.      MAGNESIUM PO   Take 1 tablet by mouth daily.      metoprolol tartrate 25 MG tablet   Commonly known as: LOPRESSOR   Take 1 tablet (25 mg total) by mouth 2 (two) times daily.      multivitamin with minerals Tabs   Take 1 tablet by mouth daily.      potassium chloride SA 20 MEQ tablet   Commonly known as: K-DUR,KLOR-CON   Take 1 tablet (20 mEq total) by mouth daily. For 7 days then stop.      rosuvastatin 20 MG tablet   Commonly known as: CRESTOR   Take 1 tablet (20 mg total) by mouth daily at 6 PM.      traMADol 50 MG tablet   Commonly known as: ULTRAM   Take 1-2 tablets (50-100 mg total) by mouth every 4 (four) hours as needed.      VITAMIN B-12 PO   Take 1 tablet by  mouth daily.          The patient has been discharged on:   1.Beta Blocker:  Yes [  x ]                              No   [   ]                              If No, reason:  2.Ace Inhibitor/ARB: Yes [  x ]                                     No  [    ]                                     If No, reason:  3.Statin:   Yes [  x ]                  No  [   ]                  If No, reason:  4.Ecasa:  Yes  [  x ]                  No   [   ]                  If No, reason:   Follow-up Information    Follow up with VAN Dinah Beers, MD. (05/17/2012 at 11 AM. Please obtain a chest x-ray at [facility] imaging at 10 AM. [facility]  imaging is located in the same office complex. Also, he will have an appointment with a nurse on December 17 at 9:30 AM to have staples removed from leg)    Contact information:   [address] [place] [address] [phone]       Follow up with Runell Gess, MD. (2 weeks-please contact the office to arrange this appointment)    Contact information:   [address] [place] [address] [phone]          Disposition: For discharge home  Patient's condition is Good  Gershon Crane, PA-C 04/23/2012  9:32 AM

## 2012-04-23 NOTE — Progress Notes (Addendum)
                    [address]            [place] [address]          [phone]     4 Days Post-Op Procedure(s) (LRB): CORONARY ARTERY BYPASS GRAFTING (CABG) (N/A) INTRAOPERATIVE TRANSESOPHAGEAL ECHOCARDIOGRAM (N/A)  Subjective: Rested poorly last night, had hallucinations.  Did not recall receiving any pain meds prior to bed. Otherwise, doing well.    Objective: Vital signs in last 24 hours: Patient Vitals for the past 24 hrs:  BP Temp Temp src Pulse Resp SpO2 Weight  04/23/12 0427 125/62 mmHg 98.6 F (37 C) Oral 83  18  98 % 190 lb 8 oz (86.41 kg)  04/22/12 2126 132/56 mmHg 98.8 F (37.1 C) Oral 87  20  99 % -  04/22/12 1524 115/61 mmHg 98.8 F (37.1 C) Oral 84  20  98 % -  04/22/12 0934 140/64 mmHg - - 84  - - -   Current Weight  04/23/12 190 lb 8 oz (86.41 kg)     Intake/Output from previous day: 12/09 0701 - 12/10 0700 In: 480 [P.O.:480] Out: 2 [Urine:2]  CBGs [phone]  PHYSICAL EXAM:  Heart: RRR Lungs: Decreased BS in bases Wound: Clean and dry Extremities: Mild RLE edema, ecchymosis    Lab Results: CBC: Basename 04/22/12 0556 04/21/12 0325  WBC 6.9 8.3  HGB 8.6* 8.8*  HCT 24.9* 25.7*  PLT 118* 121*   BMET:  Basename 04/22/12 0556 04/21/12 0325  NA 135 135  K 3.8 3.8  CL 102 103  CO2 25 25  GLUCOSE 111* 119*  BUN 24* 25*  CREATININE 0.89 0.98  CALCIUM 8.1* 7.9*    PT/INR: No results found for this basename: LABPROT,INR in the last 72 hours    Assessment/Plan: S/P Procedure(s) (LRB): CORONARY ARTERY BYPASS GRAFTING (CABG) (N/A) INTRAOPERATIVE TRANSESOPHAGEAL ECHOCARDIOGRAM (N/A) CV- Rhythm stable. BPs trending up some.  May need to increase Lopressor. Expected postop blood loss anemia- stable, monitor.   CRPI, pulm toilet. Vol overload- diurese. Possible d/c in am if remains stable.    LOS: 7 days    COLLINS,GINA H 04/23/2012   send home on Plavix 75 for 30 days and ASA 81 mg for ACS on presentation

## 2012-04-23 NOTE — Progress Notes (Signed)
1100-1130 Education completed with pt and wife. Understanding voiced. Encouraged them to watch [facility] d/c video later. Pt walked independently at 1015. Encouraged pt to walk at least twice more today. Stated he felt steady when up. Permission given to refer to [facility] Phase 2. Dasiah Hooley DunlapRN

## 2012-04-24 ENCOUNTER — Other Ambulatory Visit: Payer: Self-pay | Admitting: *Deleted

## 2012-04-24 DIAGNOSIS — Z951 Presence of aortocoronary bypass graft: Secondary | ICD-10-CM

## 2012-04-24 DIAGNOSIS — I2510 Atherosclerotic heart disease of native coronary artery without angina pectoris: Secondary | ICD-10-CM

## 2012-04-24 MED ORDER — METOPROLOL TARTRATE 25 MG PO TABS: 25.0000 mg | ORAL_TABLET | Freq: Two times a day (BID) | ORAL | Status: DC

## 2012-04-24 MED ORDER — BENAZEPRIL HCL 10 MG PO TABS: 10.0000 mg | ORAL_TABLET | Freq: Every day | ORAL | Status: DC

## 2012-04-24 MED ORDER — FUROSEMIDE 40 MG PO TABS: 40.0000 mg | ORAL_TABLET | Freq: Every day | ORAL | Status: DC

## 2012-04-24 MED ORDER — BENAZEPRIL HCL 5 MG PO TABS: 10.0000 mg | ORAL_TABLET | Freq: Every day | ORAL | Status: DC

## 2012-04-24 MED ORDER — ASPIRIN 81 MG PO TBEC: 81.0000 mg | DELAYED_RELEASE_TABLET | Freq: Every day | ORAL | Status: AC

## 2012-04-24 MED ORDER — POTASSIUM CHLORIDE CRYS ER 20 MEQ PO TBCR: 20.0000 meq | EXTENDED_RELEASE_TABLET | Freq: Every day | ORAL | Status: DC

## 2012-04-24 MED ORDER — CLOPIDOGREL BISULFATE 75 MG PO TABS: 75.0000 mg | ORAL_TABLET | Freq: Every day | ORAL | Status: DC

## 2012-04-24 MED FILL — Mannitol IV Soln 20%: INTRAVENOUS | Qty: 500 | Status: AC

## 2012-04-24 MED FILL — Heparin Sodium (Porcine) Inj 1000 Unit/ML: INTRAMUSCULAR | Qty: 10 | Status: AC

## 2012-04-24 MED FILL — Sodium Bicarbonate IV Soln 8.4%: INTRAVENOUS | Qty: 50 | Status: AC

## 2012-04-24 MED FILL — Lidocaine HCl IV Inj 20 MG/ML: INTRAVENOUS | Qty: 5 | Status: AC

## 2012-04-24 MED FILL — Heparin Sodium (Porcine) Inj 1000 Unit/ML: INTRAMUSCULAR | Qty: 30 | Status: AC

## 2012-04-24 MED FILL — Electrolyte-R (PH 7.4) Solution: INTRAVENOUS | Qty: 4000 | Status: AC

## 2012-04-24 MED FILL — Sodium Chloride Irrigation Soln 0.9%: Qty: 3000 | Status: AC

## 2012-04-24 MED FILL — Sodium Chloride IV Soln 0.9%: INTRAVENOUS | Qty: 1000 | Status: AC

## 2012-04-24 NOTE — Progress Notes (Signed)
Discharged to home with family office visits in place teaching done  

## 2012-04-24 NOTE — Progress Notes (Signed)
                   [address]            [place] [address]          [phone]      5 Days Post-Op Procedure(s) (LRB): CORONARY ARTERY BYPASS GRAFTING (CABG) (N/A) INTRAOPERATIVE TRANSESOPHAGEAL ECHOCARDIOGRAM (N/A)  Subjective: No further hallucinations-does not want Oxycodone.  Objective: Vital signs in last 24 hours: Temp:  [97.5 F (36.4 C)-98.1 F (36.7 C)] 98.1 F (36.7 C) (12/11 0423) Pulse Rate:  [79-90] 79  (12/11 0423) Cardiac Rhythm:  [-] Normal sinus rhythm (12/10 2038) Resp:  [18] 18  (12/11 0423) BP: (120-163)/(61-72) 135/70 mmHg (12/11 0423) SpO2:  [96 %-97 %] 97 % (12/11 0423) Weight:  [185 lb 10 oz (84.2 kg)] 185 lb 10 oz (84.2 kg) (12/11 0519)  Pre op weight  79.4 kg Current Weight  04/24/12 185 lb 10 oz (84.2 kg)      Intake/Output from previous day: 12/10 0701 - 12/11 0700 In: 240 [P.O.:240] Out: -    Physical Exam:  Cardiovascular: RRR Pulmonary: Slightly diminished at right base; no rales, wheezes, or rhonchi. Abdomen: Soft, non tender, bowel sounds present. Extremities: Mild bilateral lower extremity edema. Wounds: Clean and dry.  No erythema or signs of infection.  Lab Results: CBC: Basename 04/22/12 0556  WBC 6.9  HGB 8.6*  HCT 24.9*  PLT 118*   BMET:  Basename 04/22/12 0556  NA 135  K 3.8  CL 102  CO2 25  GLUCOSE 111*  BUN 24*  CREATININE 0.89  CALCIUM 8.1*    PT/INR:  Lab Results  Component Value Date   INR 1.39 04/19/2012   INR 1.05 04/17/2012   ABG:  INR: Will add last result for INR, ABG once components are confirmed Will add last 4 CBG results once components are confirmed  Assessment/Plan:  1. CV - s/p NSTEMI.Continue ECASA 81 daily,  Plavix 75 daily, and Lopressor 25 bid. SBP beginning to increase into 130-140's. Will increase Lotensin to 10 daily. 2.  Pulmonary - Encourage incentive spirometer 3. Volume Overload - Continue with diuresis 4.  Acute blood loss anemia - Last H and H 8.6  and 24.9 5.Pre op HGA1C 5.8. Will need further surveillance as an outpatient  Jaylina Ramdass MPA-C 04/24/2012,7:49 AM

## 2012-04-29 ENCOUNTER — Encounter (INDEPENDENT_AMBULATORY_CARE_PROVIDER_SITE_OTHER): Payer: Self-pay

## 2012-04-29 DIAGNOSIS — I2510 Atherosclerotic heart disease of native coronary artery without angina pectoris: Secondary | ICD-10-CM

## 2012-04-29 NOTE — Discharge Summary (Signed)
patient examined and medical record reviewed,agree with above note. VAN TRIGT III,Zynasia Burklow 04/29/2012    

## 2012-05-08 ENCOUNTER — Emergency Department (HOSPITAL_COMMUNITY)
Admission: EM | Admit: 2012-05-08 | Discharge: 2012-05-08 | Disposition: A | Discharge status: Home / Self Care | Payer: BC Managed Care – PPO | Attending: Emergency Medicine | Admitting: Emergency Medicine

## 2012-05-08 ENCOUNTER — Other Ambulatory Visit: Payer: Self-pay

## 2012-05-08 ENCOUNTER — Emergency Department (HOSPITAL_COMMUNITY): Payer: BC Managed Care – PPO

## 2012-05-08 ENCOUNTER — Encounter (HOSPITAL_COMMUNITY): Payer: Self-pay

## 2012-05-08 DIAGNOSIS — I252 Old myocardial infarction: Secondary | ICD-10-CM | POA: Insufficient documentation

## 2012-05-08 DIAGNOSIS — Z9861 Coronary angioplasty status: Secondary | ICD-10-CM | POA: Insufficient documentation

## 2012-05-08 DIAGNOSIS — R4182 Altered mental status, unspecified: Secondary | ICD-10-CM | POA: Insufficient documentation

## 2012-05-08 DIAGNOSIS — N390 Urinary tract infection, site not specified: Secondary | ICD-10-CM | POA: Insufficient documentation

## 2012-05-08 DIAGNOSIS — Z7901 Long term (current) use of anticoagulants: Secondary | ICD-10-CM | POA: Insufficient documentation

## 2012-05-08 DIAGNOSIS — Z87891 Personal history of nicotine dependence: Secondary | ICD-10-CM | POA: Insufficient documentation

## 2012-05-08 DIAGNOSIS — F29 Unspecified psychosis not due to a substance or known physiological condition: Secondary | ICD-10-CM | POA: Insufficient documentation

## 2012-05-08 DIAGNOSIS — Z79899 Other long term (current) drug therapy: Secondary | ICD-10-CM | POA: Insufficient documentation

## 2012-05-08 DIAGNOSIS — E785 Hyperlipidemia, unspecified: Secondary | ICD-10-CM | POA: Insufficient documentation

## 2012-05-08 DIAGNOSIS — I2510 Atherosclerotic heart disease of native coronary artery without angina pectoris: Secondary | ICD-10-CM | POA: Insufficient documentation

## 2012-05-08 DIAGNOSIS — Z7982 Long term (current) use of aspirin: Secondary | ICD-10-CM | POA: Insufficient documentation

## 2012-05-08 LAB — URINALYSIS, ROUTINE W REFLEX MICROSCOPIC
Glucose, UA: NEGATIVE mg/dL
Ketones, ur: 15 mg/dL — AB
pH: 6 (ref 5.0–8.0)

## 2012-05-08 LAB — CBC WITH DIFFERENTIAL/PLATELET
Basophils Absolute: 0 10*3/uL (ref 0.0–0.1)
HCT: 31.5 % — ABNORMAL LOW (ref 39.0–52.0)
Hemoglobin: 10.3 g/dL — ABNORMAL LOW (ref 13.0–17.0)
Lymphocytes Relative: 6 % — ABNORMAL LOW (ref 12–46)
Lymphs Abs: 0.9 10*3/uL (ref 0.7–4.0)
MCV: 79.5 fL (ref 78.0–100.0)
Monocytes Absolute: 0.8 10*3/uL (ref 0.1–1.0)
Monocytes Relative: 6 % (ref 3–12)
Neutro Abs: 11.6 10*3/uL — ABNORMAL HIGH (ref 1.7–7.7)
RBC: 3.96 MIL/uL — ABNORMAL LOW (ref 4.22–5.81)
WBC: 13.2 10*3/uL — ABNORMAL HIGH (ref 4.0–10.5)

## 2012-05-08 LAB — BASIC METABOLIC PANEL
CO2: 26 mEq/L (ref 19–32)
Chloride: 96 mEq/L (ref 96–112)
Creatinine, Ser: 0.93 mg/dL (ref 0.50–1.35)
Glucose, Bld: 123 mg/dL — ABNORMAL HIGH (ref 70–99)

## 2012-05-08 LAB — URINE MICROSCOPIC-ADD ON

## 2012-05-08 MED ORDER — CEPHALEXIN 500 MG PO CAPS: 500.0000 mg | ORAL_CAPSULE | Freq: Four times a day (QID) | ORAL | Status: DC

## 2012-05-08 MED ORDER — ACETAMINOPHEN 325 MG PO TABS
975.0000 mg | ORAL_TABLET | Freq: Once | ORAL | Status: AC
  Administered 2012-05-08: 975 mg via ORAL
  Filled 2012-05-08: qty 3

## 2012-05-08 MED ORDER — SODIUM CHLORIDE 0.9 % IV BOLUS (SEPSIS)
500.0000 mL | Freq: Once | INTRAVENOUS | Status: AC
  Administered 2012-05-08: 500 mL via INTRAVENOUS

## 2012-05-08 MED ORDER — DEXTROSE 5 % IV SOLN
1.0000 g | INTRAVENOUS | Status: DC
  Administered 2012-05-08: 1 g via INTRAVENOUS
  Filled 2012-05-08: qty 10

## 2012-05-08 NOTE — ED Provider Notes (Signed)
Medical screening examination/treatment/procedure(s) were conducted as a shared visit with non-physician practitioner(s) and myself.  I personally evaluated the patient during the encounter  Patient states the main issue department tonight with his anxiety, inability to sleep the fact that his mind seems to be racing. He has had a cough but this is nothing really concerned him is any fevers or chills.  Patient is alert and oriented in has not had any focal neurologic complaints. Recheck his labs and electrolytes with urinalysis and reassess.  Patient urinalysis was consistent with urinary tract infection. He has been given IV antibiotics. Patient felt well enough to go home with close followup  Celene Kras, MD 05/08/12 2208

## 2012-05-08 NOTE — ED Provider Notes (Signed)
Medical screening examination/treatment/procedure(s) were performed by non-physician practitioner and as supervising physician I was immediately available for consultation/collaboration.   Dione Booze, MD 05/08/12 [phone]

## 2012-05-08 NOTE — ED Notes (Signed)
Patient transported to X-ray 

## 2012-05-08 NOTE — ED Notes (Signed)
Pt presents with onset of dry cough x 2-3 days that pt associated with ace inhibitor.  Pt called PCP and was removed from medication, pt reports he took tylenol that eased cough.  Pt reports he was unable to sleep, reports "my mind was racing", pt denies any pain, shortness of breath, palpitations or discomfort last night.  Pt reports he was able to exercise, reports he has flu vaccine yesterday.

## 2012-05-08 NOTE — ED Provider Notes (Signed)
History     CSN: [id number]  Arrival date & time 05/08/12  1447   First MD Initiated Contact with Patient 05/08/12 1529      Chief Complaint  Patient presents with  . Cough    (Consider location/radiation/quality/duration/timing/severity/associated sxs/prior treatment) Patient is a 66 y.o. male presenting with cough and altered mental status. The history is provided by the patient and the spouse.  Cough This is a new problem. The problem occurs hourly. The cough is non-productive. There has been no fever. Pertinent negatives include no chest pain, no chills, no headaches, no myalgias, no shortness of breath and no wheezing. Associated symptoms comments: He reports having a cough since 5-vessel bypass surgery on December 6th. He reports he was started on multiple medications at discharge, where prior to admission he was on no medications. One of the medicines was lisinopril, which was stopped this morning by Dr. Hazle Coca PA after a phone conversation regarding the cough. .  Altered Mental Status Associated symptoms include coughing. Pertinent negatives include no abdominal pain, arthralgias, chest pain, chills, congestion, fever, headaches, myalgias, nausea, neck pain, vomiting or weakness. Associated symptoms comments: Since yesterday he has felt like he has lost the ability to concentrate on anything. He feels "off" and not like himself. No pain or nausea but he has lost his appetite. He denies SOB, dyspnea, dysuria or weakness. No falls. .    Past Medical History  Diagnosis Date  . No pertinent past medical history   . NSTEMI (non-ST elevated myocardial infarction) 04/16/12  . 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 Hyperlipidemia 04/17/2012  . 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 Appendectomy   . Coronary artery bypass graft  04/19/2012    Procedure: CORONARY ARTERY BYPASS GRAFTING (CABG);  Surgeon: Kerin Perna, MD;  Location: [facility] OR;  Service: Open Heart Surgery;  Laterality: N/A;  coronary artery bypass graft times five using left internal mammary artery and right leg saphenous vein  . Intraoperative transesophageal echocardiogram 04/19/2012    Procedure: INTRAOPERATIVE TRANSESOPHAGEAL ECHOCARDIOGRAM;  Surgeon: Kerin Perna, MD;  Location: [facility] OR;  Service: Open Heart Surgery;  Laterality: N/A;    History reviewed. No pertinent family history.  History  Substance Use Topics  . Smoking status: Former Smoker -- 0.2 packs/day for 1 years    Types: Cigarettes    Quit date: 04/20/1967  . Smokeless tobacco: Not on file  . Alcohol Use: Not on file      Review of Systems  Constitutional: Positive for appetite change. Negative for fever and chills.  HENT: Negative.  Negative for congestion and neck pain.   Eyes: Negative for visual disturbance.  Respiratory: Positive for cough. Negative for shortness of breath and wheezing.   Cardiovascular: Negative.  Negative for chest pain and palpitations.  Gastrointestinal: Negative.  Negative for nausea, vomiting and abdominal pain.  Genitourinary: Negative for dysuria.  Musculoskeletal: Negative.  Negative for myalgias and arthralgias.  Skin: Negative.   Neurological: Negative.  Negative for weakness and headaches.  Psychiatric/Behavioral: Positive for confusion and altered mental status.    Allergies  Ace inhibitors  Home Medications   Current Outpatient Rx  Name  Route  Sig  Dispense  Refill  . ASPIRIN 81 MG PO TBEC   Oral   Take 1 tablet (81 mg total)  by mouth daily.         Marland Kitchen CINNAMON PO   Oral   Take 1 tablet by mouth daily.         Marland Kitchen CLOPIDOGREL BISULFATE 75 MG PO TABS   Oral   Take 1 tablet (75 mg total) by mouth daily with breakfast.   30 tablet   1   . VITAMIN B-12 PO   Oral   Take 1 tablet by mouth daily.         Marland Kitchen MAGNESIUM  PO   Oral   Take 1 tablet by mouth daily.         Marland Kitchen METOPROLOL TARTRATE 25 MG PO TABS   Oral   Take 1 tablet (25 mg total) by mouth 2 (two) times daily.   60 tablet   1   . ADULT MULTIVITAMIN W/MINERALS CH   Oral   Take 1 tablet by mouth daily.         Marland Kitchen FISH OIL PO   Oral   Take 1 tablet by mouth daily.         Marland Kitchen ROSUVASTATIN CALCIUM 20 MG PO TABS   Oral   Take 1 tablet (20 mg total) by mouth daily at 6 PM.   30 tablet   1     BP 141/66  Pulse 92  Temp 98.3 F (36.8 C) (Oral)  Resp 16  Ht 5\' 11"  (1.803 m)  Wt 177 lb (80.287 kg)  BMI 24.69 kg/m2  SpO2 97%  Physical Exam  Constitutional: He is oriented to person, place, and time. He appears well-developed and well-nourished.  HENT:  Head: Normocephalic.  Neck: Normal range of motion. Neck supple.  Cardiovascular: Normal rate and regular rhythm.   Pulmonary/Chest: Effort normal and breath sounds normal.       Midline incision healing well.  Abdominal: Soft. Bowel sounds are normal. There is no tenderness. There is no rebound and no guarding.  Musculoskeletal: Normal range of motion.       Incisional scars right LE healing well. 1+ edema thought associated with recent surgery.  Neurological: He is alert and oriented to person, place, and time.  Skin: Skin is warm and dry. No rash noted.  Psychiatric: He has a normal mood and affect.    ED Course  Procedures (including critical care time)  Labs Reviewed  CBC WITH DIFFERENTIAL - Abnormal; Notable for the following:    WBC 13.2 (*)     RBC 3.96 (*)     Hemoglobin 10.3 (*)     HCT 31.5 (*)     Neutrophils Relative 87 (*)     Neutro Abs 11.6 (*)     Lymphocytes Relative 6 (*)     All other components within normal limits  BASIC METABOLIC PANEL - Abnormal; Notable for the following:    Sodium 133 (*)     Glucose, Bld 123 (*)     GFR calc non Af Amer 84 (*)     All other components within normal limits  URINALYSIS, ROUTINE W REFLEX MICROSCOPIC -  Abnormal; Notable for the following:    Color, Urine AMBER (*)  BIOCHEMICALS MAY BE AFFECTED BY COLOR   APPearance CLOUDY (*)     Specific Gravity, Urine 1.039 (*)     Hgb urine dipstick SMALL (*)     Ketones, ur 15 (*)     Protein, ur 30 (*)     Nitrite POSITIVE (*)     Leukocytes, UA  LARGE (*)     All other components within normal limits  URINE MICROSCOPIC-ADD ON - Abnormal; Notable for the following:    Bacteria, UA MANY (*)     All other components within normal limits  URINE CULTURE   Dg Chest 2 View  05/08/2012  *RADIOLOGY REPORT*  Clinical Data: Confusion and loss of appetite.  Status post CABG 04/18/2012.  CHEST - 2 VIEW  Comparison: PA and lateral chest 04/22/2012.  Findings: Elevation of the left hemidiaphragm is again seen.  Small left effusion and basilar atelectasis persist and are slightly increased.  Trace right pleural effusion is noted.  There is no pulmonary edema.  IMPRESSION: Some increase in a small left pleural effusion and basilar atelectasis.  Trace right pleural effusion again seen.   Original Report Authenticated By: Holley Dexter, M.D.      No diagnosis found. 1. UTI   MDM  UTI on lab exam. Patient's cough is improved and cxr is negative for infiltrate. IV abx given and he reports he is feeling improved. He states he wants to be discharged home.         Rodena Medin, PA-C 05/08/12 1905

## 2012-05-09 LAB — URINE CULTURE

## 2012-05-10 NOTE — ED Notes (Signed)
Positive urnc- treated per protocol.  

## 2012-05-13 ENCOUNTER — Other Ambulatory Visit (HOSPITAL_COMMUNITY): Payer: Self-pay | Admitting: Cardiovascular Disease

## 2012-05-13 DIAGNOSIS — I2510 Atherosclerotic heart disease of native coronary artery without angina pectoris: Secondary | ICD-10-CM

## 2012-05-13 DIAGNOSIS — Z951 Presence of aortocoronary bypass graft: Secondary | ICD-10-CM

## 2012-05-17 ENCOUNTER — Encounter: Payer: Self-pay | Admitting: Cardiothoracic Surgery

## 2012-05-17 ENCOUNTER — Ambulatory Visit
Admission: RE | Admit: 2012-05-17 | Discharge: 2012-05-17 | Disposition: A | Discharge status: Home / Self Care | Payer: BC Managed Care – PPO | Source: Ambulatory Visit | Attending: Cardiothoracic Surgery | Admitting: Cardiothoracic Surgery

## 2012-05-17 ENCOUNTER — Ambulatory Visit (INDEPENDENT_AMBULATORY_CARE_PROVIDER_SITE_OTHER): Payer: Self-pay | Admitting: Cardiothoracic Surgery

## 2012-05-17 VITALS — BP 124/68 | HR 70 | Resp 16 | Ht 71.0 in | Wt 175.0 lb

## 2012-05-17 DIAGNOSIS — Z951 Presence of aortocoronary bypass graft: Secondary | ICD-10-CM

## 2012-05-17 DIAGNOSIS — I2510 Atherosclerotic heart disease of native coronary artery without angina pectoris: Secondary | ICD-10-CM

## 2012-05-17 NOTE — Progress Notes (Signed)
PCP is Roque Lias, MD Referring Provider is Runell Gess, MD  Chief Complaint  Patient presents with  . Routine Post Op    F/U from surgery with CXR, S/P CABG x 5 on 04/19/12...has seen Dr. Allyson Sabal    HPI: Presents for one month followup status post CABG x5 for unstable angina and three-vessel disease The patient had severe varicosities in require open vein harvest. 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 No pertinent past medical history   . NSTEMI (non-ST elevated myocardial infarction) 04/16/12  . 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 Hyperlipidemia 04/17/2012  . 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 Appendectomy   . Coronary artery bypass graft 04/19/2012    Procedure: CORONARY ARTERY BYPASS GRAFTING (CABG);  Surgeon: Kerin Perna, MD;  Location: [facility] OR;  Service: Open Heart Surgery;  Laterality: N/A;  coronary artery bypass graft times five using left internal mammary artery and right leg saphenous vein  . Intraoperative transesophageal echocardiogram 04/19/2012    Procedure: INTRAOPERATIVE TRANSESOPHAGEAL ECHOCARDIOGRAM;  Surgeon: Kerin Perna, MD;  Location: [facility] OR;  Service: Open Heart Surgery;  Laterality: N/A;    No family history on file.  Social History History  Substance Use Topics  . Smoking status: Former Smoker -- 0.2 packs/day for 1 years    Types: Cigarettes    Quit date:  04/20/1967  . Smokeless tobacco: Not on file  . Alcohol Use: Not on file    Current Outpatient Prescriptions  Medication Sig Dispense Refill  . aspirin EC 81 MG EC tablet Take 1 tablet (81 mg total) by mouth daily.      Marland Kitchen CINNAMON PO Take 1 tablet by mouth daily.      Marland Kitchen Co-Enzyme Q-10 100 MG CAPS Take by mouth.      . Cyanocobalamin (VITAMIN B-12 PO) Take 1 tablet by mouth daily.      Marland Kitchen losartan (COZAAR) 25 MG tablet Take 25 mg by mouth daily.      Marland Kitchen MAGNESIUM PO Take 1 tablet by mouth daily.      . metoprolol tartrate (LOPRESSOR) 25 MG tablet Take 1 tablet (25 mg total) by mouth 2 (two) times daily.  60 tablet  1  . Multiple Vitamin (MULTIVITAMIN WITH MINERALS) TABS Take 1 tablet by mouth daily.      . Omega-3 Fatty Acids (FISH OIL PO) Take 1 tablet by mouth daily.      . rosuvastatin (CRESTOR) 20 MG tablet Take 1 tablet (20 mg total) by mouth daily at 6 PM.  30 tablet  1    Allergies  Allergen Reactions  . Ace Inhibitors Other (See Comments)    cough    Review of Systems no fever no angina no shortness of breath  BP 124/68  Pulse 70  Resp 16  Ht 5\' 11"  (1.803 m)  Wt 175 lb (79.379 kg)  BMI 24.41 kg/m2  SpO2 98% Physical Exam Alert and comfortable Sternotomy well-healed cardiac rhythm regular Diminished breath sounds at left base Leg incisions dry and scaly but healing, no pedal edema Diagnostic Tests: Chest x-ray with elevated left hemidiaphragm, no significant pleural effusion, sternal wires intact  Impression: Stable 4 weeks postop multivessel CABG. Patient will increase activities, start rehabilitation, start driving, and will return in 4 weeks to review his condition and discuss return to work  Plan:

## 2012-05-23 ENCOUNTER — Encounter (HOSPITAL_COMMUNITY)
Admission: RE | Admit: 2012-05-23 | Discharge: 2012-05-23 | Disposition: A | Discharge status: Home / Self Care | Payer: BC Managed Care – PPO | Source: Ambulatory Visit | Attending: Cardiovascular Disease | Admitting: Cardiovascular Disease

## 2012-05-23 DIAGNOSIS — Z5189 Encounter for other specified aftercare: Secondary | ICD-10-CM | POA: Insufficient documentation

## 2012-05-23 DIAGNOSIS — Z951 Presence of aortocoronary bypass graft: Secondary | ICD-10-CM | POA: Insufficient documentation

## 2012-05-23 DIAGNOSIS — I2510 Atherosclerotic heart disease of native coronary artery without angina pectoris: Secondary | ICD-10-CM | POA: Insufficient documentation

## 2012-05-23 DIAGNOSIS — I252 Old myocardial infarction: Secondary | ICD-10-CM | POA: Insufficient documentation

## 2012-05-23 NOTE — Progress Notes (Signed)
Cardiac Rehab Medication Review by a Pharmacist  Does the patient  feel that his/her medications are working for him/her?  yes  Has the patient been experiencing any side effects to the medications prescribed?  Some side effects that may or may not be related, such as, constipation, and diuresis  Does the patient measure his/her own blood pressure or blood glucose at home?  yes   Does the patient have any problems obtaining medications due to transportation or finances?   no  Understanding of regimen: good Understanding of indications: good Potential of compliance: excellent    Pharmacist comments: Patient was with his wife and they were knowledgeable and expressed good compliance. They had some questions about side effects, but the were not things commonly associated with these medications    Drue Stager 05/23/2012 8:33 AM

## 2012-05-27 ENCOUNTER — Encounter (HOSPITAL_COMMUNITY)
Admission: RE | Admit: 2012-05-27 | Discharge: 2012-05-27 | Disposition: A | Discharge status: Home / Self Care | Payer: BC Managed Care – PPO | Source: Ambulatory Visit | Attending: Cardiovascular Disease | Admitting: Cardiovascular Disease

## 2012-05-27 NOTE — Progress Notes (Signed)
Pt started cardiac rehab today.  Pt tolerated light exercise without difficulty. Monitor showed SR with slight ST depression with no noted ectopy.

## 2012-05-29 ENCOUNTER — Encounter (HOSPITAL_COMMUNITY)
Admission: RE | Admit: 2012-05-29 | Discharge: 2012-05-29 | Disposition: A | Discharge status: Home / Self Care | Payer: BC Managed Care – PPO | Source: Ambulatory Visit | Attending: Cardiovascular Disease | Admitting: Cardiovascular Disease

## 2012-05-31 ENCOUNTER — Encounter (HOSPITAL_COMMUNITY)
Admission: RE | Admit: 2012-05-31 | Discharge: 2012-05-31 | Disposition: A | Discharge status: Home / Self Care | Payer: BC Managed Care – PPO | Source: Ambulatory Visit | Attending: Cardiovascular Disease | Admitting: Cardiovascular Disease

## 2012-06-03 ENCOUNTER — Encounter (HOSPITAL_COMMUNITY)
Admission: RE | Admit: 2012-06-03 | Discharge: 2012-06-03 | Disposition: A | Discharge status: Home / Self Care | Payer: BC Managed Care – PPO | Source: Ambulatory Visit | Attending: Cardiovascular Disease | Admitting: Cardiovascular Disease

## 2012-06-03 NOTE — Progress Notes (Signed)
Reviewed home exercise with pt today.  Pt plans to walk and use treadmill, stationary bike, and recumbent bike at home for exercise.  Reviewed THR, pulse, RPE, sign and symptoms, and when to call 911 or MD.  Pt voiced understanding. Fabio Pierce, MA, ACSM RCEP

## 2012-06-05 ENCOUNTER — Encounter (HOSPITAL_COMMUNITY)
Admission: RE | Admit: 2012-06-05 | Discharge: 2012-06-05 | Disposition: A | Discharge status: Home / Self Care | Payer: BC Managed Care – PPO | Source: Ambulatory Visit | Attending: Cardiovascular Disease | Admitting: Cardiovascular Disease

## 2012-06-06 ENCOUNTER — Other Ambulatory Visit: Payer: Self-pay | Admitting: Surgical

## 2012-06-07 ENCOUNTER — Encounter (HOSPITAL_COMMUNITY)
Admission: RE | Admit: 2012-06-07 | Discharge: 2012-06-07 | Disposition: A | Discharge status: Home / Self Care | Payer: BC Managed Care – PPO | Source: Ambulatory Visit | Attending: Cardiovascular Disease | Admitting: Cardiovascular Disease

## 2012-06-10 ENCOUNTER — Encounter (HOSPITAL_COMMUNITY)
Admission: RE | Admit: 2012-06-10 | Discharge: 2012-06-10 | Disposition: A | Discharge status: Home / Self Care | Payer: BC Managed Care – PPO | Source: Ambulatory Visit | Attending: Cardiovascular Disease | Admitting: Cardiovascular Disease

## 2012-06-12 ENCOUNTER — Encounter (HOSPITAL_COMMUNITY)
Admission: RE | Admit: 2012-06-12 | Discharge: 2012-06-12 | Disposition: A | Discharge status: Home / Self Care | Payer: BC Managed Care – PPO | Source: Ambulatory Visit | Attending: Cardiovascular Disease | Admitting: Cardiovascular Disease

## 2012-06-14 ENCOUNTER — Encounter (HOSPITAL_COMMUNITY)
Admission: RE | Admit: 2012-06-14 | Discharge: 2012-06-14 | Disposition: A | Discharge status: Home / Self Care | Payer: BC Managed Care – PPO | Source: Ambulatory Visit | Attending: Cardiovascular Disease | Admitting: Cardiovascular Disease

## 2012-06-17 ENCOUNTER — Other Ambulatory Visit: Payer: Self-pay | Admitting: *Deleted

## 2012-06-17 ENCOUNTER — Encounter (HOSPITAL_COMMUNITY)
Admission: RE | Admit: 2012-06-17 | Discharge: 2012-06-17 | Disposition: A | Discharge status: Home / Self Care | Payer: BC Managed Care – PPO | Source: Ambulatory Visit | Attending: Cardiovascular Disease | Admitting: Cardiovascular Disease

## 2012-06-17 DIAGNOSIS — Z5189 Encounter for other specified aftercare: Secondary | ICD-10-CM | POA: Insufficient documentation

## 2012-06-17 DIAGNOSIS — I2510 Atherosclerotic heart disease of native coronary artery without angina pectoris: Secondary | ICD-10-CM | POA: Insufficient documentation

## 2012-06-17 DIAGNOSIS — I252 Old myocardial infarction: Secondary | ICD-10-CM | POA: Insufficient documentation

## 2012-06-17 DIAGNOSIS — Z951 Presence of aortocoronary bypass graft: Secondary | ICD-10-CM | POA: Insufficient documentation

## 2012-06-19 ENCOUNTER — Ambulatory Visit
Admission: RE | Admit: 2012-06-19 | Discharge: 2012-06-19 | Disposition: A | Discharge status: Home / Self Care | Payer: BC Managed Care – PPO | Source: Ambulatory Visit | Attending: Cardiothoracic Surgery | Admitting: Cardiothoracic Surgery

## 2012-06-19 ENCOUNTER — Encounter (HOSPITAL_COMMUNITY)
Admission: RE | Admit: 2012-06-19 | Discharge: 2012-06-19 | Disposition: A | Discharge status: Home / Self Care | Payer: BC Managed Care – PPO | Source: Ambulatory Visit | Attending: Cardiovascular Disease | Admitting: Cardiovascular Disease

## 2012-06-19 ENCOUNTER — Ambulatory Visit (INDEPENDENT_AMBULATORY_CARE_PROVIDER_SITE_OTHER): Payer: Self-pay | Admitting: Cardiothoracic Surgery

## 2012-06-19 ENCOUNTER — Encounter: Payer: Self-pay | Admitting: Cardiothoracic Surgery

## 2012-06-19 VITALS — BP 132/74 | HR 67 | Resp 16 | Ht 71.0 in | Wt 173.1 lb

## 2012-06-19 DIAGNOSIS — I2510 Atherosclerotic heart disease of native coronary artery without angina pectoris: Secondary | ICD-10-CM

## 2012-06-19 DIAGNOSIS — Z951 Presence of aortocoronary bypass graft: Secondary | ICD-10-CM

## 2012-06-19 NOTE — Progress Notes (Signed)
PCP is Roque Lias, MD Referring Provider is Runell Gess, MD  Chief Complaint  Patient presents with  . Routine Post Op    1 month f/u with cxr...s/p  NSTEMI/CABG X 5  04/19/12.Marland KitchenMarland Kitchenpresently in cardiac rehab    HPI: Final 2 month followup after multivessel CABG x5 for unstable angina. Doing well participating in outpatient rehabilitation without difficulty. Problems with upper GI gas secondary to partial left diaphragm paresis improved. Chest x-ray today shows improvement in left hemidiaphragm paresis. Patient without recurrent angina. The incisions are all healing well. 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 and [employer].  Past Medical History  Diagnosis Date  . No pertinent past medical history   . NSTEMI (non-ST elevated myocardial infarction) 04/16/12  . 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 Hyperlipidemia 04/17/2012  . 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 Appendectomy   . Coronary artery bypass graft 04/19/2012    Procedure: CORONARY ARTERY BYPASS GRAFTING (CABG);  Surgeon: Kerin Perna, MD;  Location: [facility] OR;  Service: Open Heart Surgery;  Laterality: N/A;  coronary artery bypass graft times five using left internal mammary artery and right leg saphenous vein  . Intraoperative transesophageal echocardiogram 04/19/2012    Procedure: INTRAOPERATIVE TRANSESOPHAGEAL ECHOCARDIOGRAM;  Surgeon: Kerin Perna, MD;  Location: [facility] OR;  Service: Open Heart Surgery;  Laterality: N/A;    No family history on file.  Social History History  Substance Use Topics  . Smoking status: Former Smoker -- 0.2 packs/day for 1 years    Types: Cigarettes    Quit date: 04/20/1967  . Smokeless tobacco: Not on file  . Alcohol Use: Not on file    Current Outpatient  Prescriptions  Medication Sig Dispense Refill  . aspirin EC 81 MG EC tablet Take 1 tablet (81 mg total) by mouth daily.      Marland Kitchen CINNAMON PO Take 1 tablet by mouth daily.      Marland Kitchen Co-Enzyme Q-10 100 MG CAPS Take 100 mg by mouth daily.       . Cyanocobalamin (VITAMIN B-12 PO) Take 1 tablet by mouth daily.      Marland Kitchen losartan (COZAAR) 25 MG tablet Take 25 mg by mouth daily.      Marland Kitchen MAGNESIUM PO Take 1 tablet by mouth daily.      . metoprolol tartrate (LOPRESSOR) 25 MG tablet Take 1 tablet (25 mg total) by mouth 2 (two) times daily.  60 tablet  1  . Multiple Vitamin (MULTIVITAMIN WITH MINERALS) TABS Take 1 tablet by mouth daily.      . Omega-3 Fatty Acids (FISH OIL PO) Take 2 tablets by mouth daily.       . rosuvastatin (CRESTOR) 20 MG tablet Take 1 tablet (20 mg total) by mouth daily at 6 PM.  30 tablet  1    Allergies  Allergen Reactions  . Ace Inhibitors Other (See Comments)    cough    Review of Systems no fever gradual improvement after CABG x5  BP 132/74  Pulse 67  Resp 16  Ht 5\' 11"  (1.803 m)  Wt 173 lb 1 oz (78.5 kg)  BMI 24.14 kg/m2  SpO2 99% Physical Exam Alert and comfortable Lungs clear Incision is well-healed Heart rate regular without gallop  Diagnostic Tests: Chest x-ray with improved left hemidiaphragm now with  mild elevation  Impression: Doing well 2 months after CABG. Continue current medications. Return to work statement provided patient. Plan: Return as needed

## 2012-06-21 ENCOUNTER — Encounter (HOSPITAL_COMMUNITY)
Admission: RE | Admit: 2012-06-21 | Discharge: 2012-06-21 | Disposition: A | Discharge status: Home / Self Care | Payer: BC Managed Care – PPO | Source: Ambulatory Visit | Attending: Cardiovascular Disease | Admitting: Cardiovascular Disease

## 2012-06-24 ENCOUNTER — Encounter (HOSPITAL_COMMUNITY)
Admission: RE | Admit: 2012-06-24 | Discharge: 2012-06-24 | Disposition: A | Discharge status: Home / Self Care | Payer: BC Managed Care – PPO | Source: Ambulatory Visit | Attending: Cardiovascular Disease | Admitting: Cardiovascular Disease

## 2012-06-26 ENCOUNTER — Encounter (HOSPITAL_COMMUNITY): Payer: BC Managed Care – PPO

## 2012-06-28 ENCOUNTER — Encounter (HOSPITAL_COMMUNITY): Payer: BC Managed Care – PPO

## 2012-06-29 ENCOUNTER — Other Ambulatory Visit: Payer: Self-pay

## 2012-07-01 ENCOUNTER — Encounter (HOSPITAL_COMMUNITY): Payer: BC Managed Care – PPO

## 2012-07-01 ENCOUNTER — Encounter (HOSPITAL_COMMUNITY)
Admission: RE | Admit: 2012-07-01 | Discharge: 2012-07-01 | Disposition: A | Discharge status: Home / Self Care | Payer: BC Managed Care – PPO | Source: Ambulatory Visit | Attending: Cardiovascular Disease | Admitting: Cardiovascular Disease

## 2012-07-03 ENCOUNTER — Encounter (HOSPITAL_COMMUNITY): Payer: BC Managed Care – PPO

## 2012-07-03 ENCOUNTER — Encounter (HOSPITAL_COMMUNITY)
Admission: RE | Admit: 2012-07-03 | Discharge: 2012-07-03 | Disposition: A | Discharge status: Home / Self Care | Payer: BC Managed Care – PPO | Source: Ambulatory Visit | Attending: Cardiovascular Disease | Admitting: Cardiovascular Disease

## 2012-07-05 ENCOUNTER — Encounter (HOSPITAL_COMMUNITY)
Admission: RE | Admit: 2012-07-05 | Discharge: 2012-07-05 | Disposition: A | Discharge status: Home / Self Care | Payer: BC Managed Care – PPO | Source: Ambulatory Visit | Attending: Cardiovascular Disease | Admitting: Cardiovascular Disease

## 2012-07-05 ENCOUNTER — Encounter (HOSPITAL_COMMUNITY): Payer: BC Managed Care – PPO

## 2012-07-06 ENCOUNTER — Other Ambulatory Visit: Payer: Self-pay | Admitting: Physician Assistant

## 2012-07-08 ENCOUNTER — Encounter (HOSPITAL_COMMUNITY)
Admission: RE | Admit: 2012-07-08 | Discharge: 2012-07-08 | Disposition: A | Discharge status: Home / Self Care | Payer: BC Managed Care – PPO | Source: Ambulatory Visit | Attending: Cardiovascular Disease | Admitting: Cardiovascular Disease

## 2012-07-08 ENCOUNTER — Encounter (HOSPITAL_COMMUNITY): Payer: BC Managed Care – PPO

## 2012-07-08 NOTE — Progress Notes (Signed)
Brief Nutrition Note Spoke with pt. MEDFICTS completed with pt. Pt concerned re: wt loss. Pt wants to return to his reported UBW of 175-180 lbs. This Clinical research associate discussed pt adding 1-2 Glucerna supplements daily and/or whey protein to foods to increase kcal and protein in the diet. Pt concerned re: pre-diabetes dx and watches added sugar in the diet closely. Pre-diabetes discussed. Pt reports following a high fiber diet "for a long time now." Pt wife reportedly very supportive of pt diet. Pt expressed understanding of the information reviewed. Continue client-centered nutrition education by RD as part of interdisciplinary care.  Monitor and evaluate progress toward nutrition goal with team.

## 2012-07-09 ENCOUNTER — Encounter (HOSPITAL_COMMUNITY): Payer: BC Managed Care – PPO

## 2012-07-10 ENCOUNTER — Encounter (HOSPITAL_COMMUNITY): Payer: BC Managed Care – PPO

## 2012-07-10 ENCOUNTER — Encounter (HOSPITAL_COMMUNITY)
Admission: RE | Admit: 2012-07-10 | Discharge: 2012-07-10 | Disposition: A | Discharge status: Home / Self Care | Payer: BC Managed Care – PPO | Source: Ambulatory Visit | Attending: Cardiovascular Disease | Admitting: Cardiovascular Disease

## 2012-07-12 ENCOUNTER — Encounter (HOSPITAL_COMMUNITY): Payer: BC Managed Care – PPO

## 2012-07-12 ENCOUNTER — Encounter (HOSPITAL_COMMUNITY)
Admission: RE | Admit: 2012-07-12 | Discharge: 2012-07-12 | Disposition: A | Discharge status: Home / Self Care | Payer: BC Managed Care – PPO | Source: Ambulatory Visit | Attending: Cardiovascular Disease | Admitting: Cardiovascular Disease

## 2012-07-12 NOTE — Progress Notes (Signed)
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  Upon assessment sternal incision CDI nothing noted abnormal upon palpation. Dr Jerelyn Mallie office called and notified.  Jolene Dr Jerelyn Briceson nurse will call the patient to set up an appointment

## 2012-07-15 ENCOUNTER — Encounter (HOSPITAL_COMMUNITY)
Admission: RE | Admit: 2012-07-15 | Discharge: 2012-07-15 | Disposition: A | Discharge status: Home / Self Care | Payer: BC Managed Care – PPO | Source: Ambulatory Visit | Attending: Cardiovascular Disease | Admitting: Cardiovascular Disease

## 2012-07-15 ENCOUNTER — Encounter (HOSPITAL_COMMUNITY): Payer: BC Managed Care – PPO

## 2012-07-15 DIAGNOSIS — I252 Old myocardial infarction: Secondary | ICD-10-CM | POA: Insufficient documentation

## 2012-07-15 DIAGNOSIS — Z5189 Encounter for other specified aftercare: Secondary | ICD-10-CM | POA: Insufficient documentation

## 2012-07-15 DIAGNOSIS — I2510 Atherosclerotic heart disease of native coronary artery without angina pectoris: Secondary | ICD-10-CM | POA: Insufficient documentation

## 2012-07-15 DIAGNOSIS — Z951 Presence of aortocoronary bypass graft: Secondary | ICD-10-CM | POA: Insufficient documentation

## 2012-07-15 NOTE — Progress Notes (Signed)
Tyler Fox 67 y.o. male Nutrition Note Spoke with pt.  Nutrition Plan and Nutrition Survey goals reviewed with pt. Pt is following Step 2 of the Therapeutic Lifestyle Changes diet. Pt wants to gain wt. Pt recently started whey protein supplements daily. Wt gain tips reviewed. According to pt's last A1c, pt is pre-diabetic. Pre-diabetes previously discussed. Pt expressed understanding of the information reviewed. Pt aware of nutrition education classes offered and his wife may attend the nutrition classes.  Nutrition Diagnosis   Food-and nutrition-related knowledge deficit related to lack of exposure to information as related to diagnosis of: ? CVD ? Pre-DM (A1c 5.8)  Nutrition RX/ Estimated Daily Nutrition Needs for: wt gain  2900-3400 Kcal, 80-94 gm fat, 19-23 gm sat fat, 2.8-3.4 gm trans-fat, <1500 mg sodium   Nutrition Intervention   Pt's individual nutrition plan including cholesterol goals reviewed with pt.   Benefits of adopting Therapeutic Lifestyle Changes discussed when Medficts reviewed.   Pt to attend the Portion Distortion class   Pt given handouts for: ? Nutrition I class ? Nutrition II class ? Pre-diabetes    Continue client-centered nutrition education by RD, as part of interdisciplinary care.  Goal(s)   Pt to identify food quantities necessary to achieve: ? wt gain to a goal wt of 175-180 lb (79.5-81.8 kg) at graduation from cardiac rehab.   Monitor and Evaluate progress toward nutrition goal with team. Nutrition Risk:  Low

## 2012-07-17 ENCOUNTER — Other Ambulatory Visit: Payer: Self-pay

## 2012-07-17 ENCOUNTER — Encounter (HOSPITAL_COMMUNITY)
Admission: RE | Admit: 2012-07-17 | Discharge: 2012-07-17 | Disposition: A | Discharge status: Home / Self Care | Payer: BC Managed Care – PPO | Source: Ambulatory Visit | Attending: Cardiovascular Disease | Admitting: Cardiovascular Disease

## 2012-07-17 ENCOUNTER — Ambulatory Visit (INDEPENDENT_AMBULATORY_CARE_PROVIDER_SITE_OTHER): Payer: Self-pay | Admitting: Cardiothoracic Surgery

## 2012-07-17 ENCOUNTER — Encounter (HOSPITAL_COMMUNITY): Payer: BC Managed Care – PPO

## 2012-07-17 VITALS — BP 166/79 | HR 56 | Resp 16 | Ht 71.0 in | Wt 176.4 lb

## 2012-07-17 DIAGNOSIS — I2510 Atherosclerotic heart disease of native coronary artery without angina pectoris: Secondary | ICD-10-CM

## 2012-07-17 DIAGNOSIS — J986 Disorders of diaphragm: Secondary | ICD-10-CM

## 2012-07-17 DIAGNOSIS — Z951 Presence of aortocoronary bypass graft: Secondary | ICD-10-CM

## 2012-07-17 NOTE — Progress Notes (Signed)
PCP is Roque Lias, MD Referring Provider is Marykay Lex, MD  Chief Complaint  Patient presents with  . 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 He states this is unlike his preoperative anginal symptoms. He is doing well in cardiac rehabilitation and has no pain during his exercising. The incision is well-healed. His last chest x-ray showed no evidence of a broken sternal wire. He has no GI complaints of reflux or belching. 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 He presents today for evaluation.   Past Medical History  Diagnosis Date  . No pertinent past medical history   . NSTEMI (non-ST elevated myocardial infarction) 04/16/12  . 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 Hyperlipidemia 04/17/2012  . 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 Appendectomy    . Coronary artery bypass graft  04/19/2012    Procedure: CORONARY ARTERY BYPASS GRAFTING (CABG);  Surgeon: Kerin Perna, MD;  Location: [facility] OR;  Service: Open Heart Surgery;  Laterality: N/A;  coronary artery bypass graft times five using left internal mammary artery and right leg saphenous vein  . Intraoperative transesophageal echocardiogram  04/19/2012    Procedure: INTRAOPERATIVE TRANSESOPHAGEAL ECHOCARDIOGRAM;  Surgeon: Kerin Perna, MD;  Location: [facility] OR;  Service: Open Heart Surgery;  Laterality: N/A;    No family history on file.  Social History History  Substance Use Topics  . Smoking status: Former Smoker -- 0.25 packs/day for 1 years    Types: Cigarettes   Quit date: 04/20/1967  . Smokeless tobacco: Not on file  . Alcohol Use: Not on file    Current Outpatient Prescriptions  Medication Sig Dispense Refill  . aspirin EC 81 MG EC tablet Take 1 tablet (81 mg total) by mouth daily.      Marland Kitchen CINNAMON PO Take 1 tablet by mouth daily.      Marland Kitchen Co-Enzyme Q-10 100 MG CAPS Take 100 mg by mouth daily.       . Cyanocobalamin (VITAMIN B-12 PO) Take 1 tablet by mouth daily.      Marland Kitchen losartan (COZAAR) 25 MG tablet Take 25 mg by mouth daily.      Marland Kitchen MAGNESIUM PO Take 1 tablet by mouth daily.      . metoprolol tartrate (LOPRESSOR) 25 MG tablet Take 1 tablet (25 mg total) by mouth 2 (two) times daily.  60 tablet  1  . Multiple Vitamin (MULTIVITAMIN WITH MINERALS) TABS Take 1 tablet by mouth daily.      . Omega-3 Fatty Acids (FISH OIL PO) Take 2 tablets by mouth daily.       . rosuvastatin (CRESTOR) 20 MG tablet Take 1 tablet (20 mg total) by mouth daily at 6 PM.  30 tablet  1   No current facility-administered medications for this visit.    Allergies  Allergen Reactions  . Ace Inhibitors Other (See Comments)    cough    Review of Systems weight is increasing exercise tolerance is increasing doing well in cardiac rehabilitation  BP 166/79  Pulse 56  Resp 16  Ht 5\' 11"  (1.803 m)  Wt 176 lb 5.9 oz (80 kg)  BMI 24.61 kg/m2  SpO2 98% Physical Exam Alert and comfortable Breath sounds clear and equal Sternum stable well-healed-some tenderness over the xiphoid and left costal margin No evidence of a incisional hernia beneath the xiphoid  Diagnostic Tests: None today chest CT scan is ordered Impression: Chest wall tenderness-musculoskeletal pain Patient does not wish pain medication Plan: We'll check CT his chest tube evaluate structural integrity of the sternal closure, possible fracture of sternal wire. Return to office after CT.

## 2012-07-19 ENCOUNTER — Encounter (HOSPITAL_COMMUNITY): Payer: BC Managed Care – PPO

## 2012-07-22 ENCOUNTER — Encounter (HOSPITAL_COMMUNITY)
Admission: RE | Admit: 2012-07-22 | Discharge: 2012-07-22 | Disposition: A | Discharge status: Home / Self Care | Payer: BC Managed Care – PPO | Source: Ambulatory Visit | Attending: Cardiovascular Disease | Admitting: Cardiovascular Disease

## 2012-07-22 ENCOUNTER — Encounter (HOSPITAL_COMMUNITY): Payer: BC Managed Care – PPO

## 2012-07-23 ENCOUNTER — Other Ambulatory Visit: Payer: Self-pay

## 2012-07-23 ENCOUNTER — Encounter (HOSPITAL_COMMUNITY): Payer: BC Managed Care – PPO

## 2012-07-23 DIAGNOSIS — I2510 Atherosclerotic heart disease of native coronary artery without angina pectoris: Secondary | ICD-10-CM

## 2012-07-24 ENCOUNTER — Encounter: Payer: Self-pay | Admitting: Cardiothoracic Surgery

## 2012-07-24 ENCOUNTER — Encounter (HOSPITAL_COMMUNITY): Payer: BC Managed Care – PPO

## 2012-07-24 ENCOUNTER — Encounter (HOSPITAL_COMMUNITY)
Admission: RE | Admit: 2012-07-24 | Discharge: 2012-07-24 | Disposition: A | Discharge status: Home / Self Care | Payer: BC Managed Care – PPO | Source: Ambulatory Visit | Attending: Cardiovascular Disease | Admitting: Cardiovascular Disease

## 2012-07-24 ENCOUNTER — Ambulatory Visit (INDEPENDENT_AMBULATORY_CARE_PROVIDER_SITE_OTHER): Payer: BC Managed Care – PPO | Admitting: Cardiothoracic Surgery

## 2012-07-24 ENCOUNTER — Ambulatory Visit
Admission: RE | Admit: 2012-07-24 | Discharge: 2012-07-24 | Disposition: A | Discharge status: Home / Self Care | Payer: BC Managed Care – PPO | Source: Ambulatory Visit | Attending: Cardiothoracic Surgery | Admitting: Cardiothoracic Surgery

## 2012-07-24 VITALS — BP 137/75 | HR 57 | Resp 16 | Ht 71.0 in | Wt 176.0 lb

## 2012-07-24 DIAGNOSIS — R071 Chest pain on breathing: Secondary | ICD-10-CM

## 2012-07-24 DIAGNOSIS — I2510 Atherosclerotic heart disease of native coronary artery without angina pectoris: Secondary | ICD-10-CM

## 2012-07-24 DIAGNOSIS — Z951 Presence of aortocoronary bypass graft: Secondary | ICD-10-CM

## 2012-07-24 DIAGNOSIS — R0789 Other chest pain: Secondary | ICD-10-CM

## 2012-07-24 NOTE — Progress Notes (Signed)
PCP is Roque Lias, MD Referring Keiko Myricks is Runell Gess, MD  Chief Complaint  Patient presents with  . Routine Post Op    1 wk f/u with CT CHEST to evaluate distal sternal tenderness    HPI: Superficial sternal tenderness, hypersensitivity, worse with stretching his sternal incision. Small keloid formation at distal aspect of the sternal incision. CT scan of chest performed which shows good healing of sternotomy incision, no migration of wires, no evidence of sternal malunion, no evidence of infection.  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 He does not wish further imaging studies   Past Medical History  Diagnosis Date  . No pertinent past medical history   . NSTEMI (non-ST elevated myocardial infarction) 04/16/12  . 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 Hyperlipidemia 04/17/2012  . 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 Appendectomy    . Coronary artery bypass graft  04/19/2012    Procedure: CORONARY ARTERY BYPASS GRAFTING (CABG);  Surgeon: Kerin Perna, MD;  Location: [facility] OR;  Service: Open Heart Surgery;  Laterality: N/A;  coronary artery bypass graft times five using left internal mammary artery and right leg saphenous vein  . Intraoperative transesophageal echocardiogram  04/19/2012    Procedure: INTRAOPERATIVE TRANSESOPHAGEAL ECHOCARDIOGRAM;  Surgeon: Kerin Perna, MD;  Location: [facility] OR;  Service: Open Heart Surgery;  Laterality: N/A;    No family history on file.  Social History History  Substance Use Topics  . Smoking status: Former Smoker -- 0.25 packs/day for 1 years    Types: Cigarettes    Quit date: 04/20/1967  . Smokeless tobacco: Not on file  . Alcohol Use: Not on file    Current Outpatient Prescriptions  Medication  Sig Dispense Refill  . aspirin EC 81 MG EC tablet Take 1 tablet (81 mg total) by mouth daily.      Marland Kitchen CINNAMON PO Take 1 tablet by mouth daily.      Marland Kitchen Co-Enzyme Q-10 100 MG CAPS Take 100 mg by mouth daily.       . Cyanocobalamin (VITAMIN B-12 PO) Take 1 tablet by mouth daily.      Marland Kitchen losartan (COZAAR) 25 MG tablet Take 25 mg by mouth daily.      Marland Kitchen MAGNESIUM PO Take 1 tablet by mouth daily.      . metoprolol tartrate (LOPRESSOR) 25 MG tablet Take 1 tablet (25 mg total) by mouth 2 (two) times daily.  60 tablet  1  . Multiple Vitamin (MULTIVITAMIN WITH MINERALS) TABS Take 1 tablet by mouth daily.      . Omega-3 Fatty Acids (FISH OIL PO) Take 2 tablets by mouth daily.       . rosuvastatin (CRESTOR) 20 MG tablet Take 1 tablet (20 mg total) by mouth daily at 6 PM.  30 tablet  1   No current facility-administered medications for this visit.    Allergies  Allergen Reactions  . Ace Inhibitors Other (See Comments)    cough    Review of Systems participating rehabilitation working [facility]  BP 137/75  Pulse 57  Resp 16  Ht 5\' 11"  (1.803 m)  Wt 176 lb (79.833 kg)  BMI 24.56 kg/m2  SpO2 98% Physical Exam Alert and  comfortable Lungs clear Heart rate regular Sternal incision well-healed, tenderness keloid at lower aspect   Diagnostic Tests: CT scan of chest reviewed with patient. Elevation of left hemidiaphragm had has improved No evidence of sternal malunion or healing problems of sternotomy, patient reassured  Impression: Recovering from multivessel bypass grafting. Painful sternal keloid, patient reassured of no structural abnormality of sternotomy  Plan: Told the patient he should call us if he wishes to keloid to be excised as an outpatient procedure

## 2012-07-26 ENCOUNTER — Encounter (HOSPITAL_COMMUNITY): Payer: BC Managed Care – PPO

## 2012-07-29 ENCOUNTER — Encounter (HOSPITAL_COMMUNITY)
Admission: RE | Admit: 2012-07-29 | Discharge: 2012-07-29 | Disposition: A | Discharge status: Home / Self Care | Payer: BC Managed Care – PPO | Source: Ambulatory Visit | Attending: Cardiovascular Disease | Admitting: Cardiovascular Disease

## 2012-07-29 ENCOUNTER — Encounter (HOSPITAL_COMMUNITY): Payer: BC Managed Care – PPO

## 2012-07-31 ENCOUNTER — Encounter (HOSPITAL_COMMUNITY): Payer: BC Managed Care – PPO

## 2012-08-02 ENCOUNTER — Encounter (HOSPITAL_COMMUNITY): Payer: BC Managed Care – PPO

## 2012-08-05 ENCOUNTER — Encounter (HOSPITAL_COMMUNITY)
Admission: RE | Admit: 2012-08-05 | Discharge: 2012-08-05 | Disposition: A | Discharge status: Home / Self Care | Payer: BC Managed Care – PPO | Source: Ambulatory Visit | Attending: Cardiovascular Disease | Admitting: Cardiovascular Disease

## 2012-08-05 ENCOUNTER — Encounter (HOSPITAL_COMMUNITY): Payer: BC Managed Care – PPO

## 2012-08-07 ENCOUNTER — Encounter (HOSPITAL_COMMUNITY)
Admission: RE | Admit: 2012-08-07 | Discharge: 2012-08-07 | Disposition: A | Discharge status: Home / Self Care | Payer: BC Managed Care – PPO | Source: Ambulatory Visit | Attending: Cardiovascular Disease | Admitting: Cardiovascular Disease

## 2012-08-07 ENCOUNTER — Encounter (HOSPITAL_COMMUNITY): Payer: BC Managed Care – PPO

## 2012-08-09 ENCOUNTER — Encounter (HOSPITAL_COMMUNITY): Payer: BC Managed Care – PPO

## 2012-08-09 ENCOUNTER — Encounter (HOSPITAL_COMMUNITY)
Admission: RE | Admit: 2012-08-09 | Discharge: 2012-08-09 | Disposition: A | Discharge status: Home / Self Care | Payer: BC Managed Care – PPO | Source: Ambulatory Visit | Attending: Cardiovascular Disease | Admitting: Cardiovascular Disease

## 2012-08-12 ENCOUNTER — Encounter (HOSPITAL_COMMUNITY)
Admission: RE | Admit: 2012-08-12 | Discharge: 2012-08-12 | Disposition: A | Discharge status: Home / Self Care | Payer: BC Managed Care – PPO | Source: Ambulatory Visit | Attending: Cardiovascular Disease | Admitting: Cardiovascular Disease

## 2012-08-12 ENCOUNTER — Encounter (HOSPITAL_COMMUNITY): Payer: BC Managed Care – PPO

## 2012-08-14 ENCOUNTER — Encounter (HOSPITAL_COMMUNITY)
Admission: RE | Admit: 2012-08-14 | Discharge: 2012-08-14 | Disposition: A | Discharge status: Home / Self Care | Payer: BC Managed Care – PPO | Source: Ambulatory Visit | Attending: Cardiovascular Disease | Admitting: Cardiovascular Disease

## 2012-08-14 ENCOUNTER — Encounter (HOSPITAL_COMMUNITY): Payer: BC Managed Care – PPO

## 2012-08-14 DIAGNOSIS — I2510 Atherosclerotic heart disease of native coronary artery without angina pectoris: Secondary | ICD-10-CM | POA: Insufficient documentation

## 2012-08-14 DIAGNOSIS — Z5189 Encounter for other specified aftercare: Secondary | ICD-10-CM | POA: Insufficient documentation

## 2012-08-14 DIAGNOSIS — I252 Old myocardial infarction: Secondary | ICD-10-CM | POA: Insufficient documentation

## 2012-08-14 DIAGNOSIS — Z951 Presence of aortocoronary bypass graft: Secondary | ICD-10-CM | POA: Insufficient documentation

## 2012-08-16 ENCOUNTER — Encounter (HOSPITAL_COMMUNITY)
Admission: RE | Admit: 2012-08-16 | Discharge: 2012-08-16 | Disposition: A | Discharge status: Home / Self Care | Payer: BC Managed Care – PPO | Source: Ambulatory Visit | Attending: Cardiovascular Disease | Admitting: Cardiovascular Disease

## 2012-08-16 ENCOUNTER — Encounter (HOSPITAL_COMMUNITY): Payer: BC Managed Care – PPO

## 2012-08-19 ENCOUNTER — Encounter (HOSPITAL_COMMUNITY): Payer: BC Managed Care – PPO

## 2012-08-19 ENCOUNTER — Encounter (HOSPITAL_COMMUNITY)
Admission: RE | Admit: 2012-08-19 | Discharge: 2012-08-19 | Disposition: A | Discharge status: Home / Self Care | Payer: BC Managed Care – PPO | Source: Ambulatory Visit | Attending: Cardiovascular Disease | Admitting: Cardiovascular Disease

## 2012-08-21 ENCOUNTER — Encounter (HOSPITAL_COMMUNITY): Payer: BC Managed Care – PPO

## 2012-08-21 ENCOUNTER — Encounter (HOSPITAL_COMMUNITY)
Admission: RE | Admit: 2012-08-21 | Discharge: 2012-08-21 | Disposition: A | Discharge status: Home / Self Care | Payer: BC Managed Care – PPO | Source: Ambulatory Visit | Attending: Cardiovascular Disease | Admitting: Cardiovascular Disease

## 2012-08-21 NOTE — Progress Notes (Signed)
Dr Hulan Saas blood pressure was noted at  192/80 during bike testing repeat blood pressure 172/70 then 150 systolic.  Dr Hulan Saas blood pressure went back up to 190's on the treadmill.  Patients workload was reduced.  Subsequent blood pressures improved.  Exit blood pressure 124/70.  Dr Hazle Coca office called and notified of today's blood pressure elevations. Patient asymptomatic. Faxed today's blood pressures to Dr Allyson Sabal for review.

## 2012-08-23 ENCOUNTER — Encounter (HOSPITAL_COMMUNITY)
Admission: RE | Admit: 2012-08-23 | Discharge: 2012-08-23 | Disposition: A | Discharge status: Home / Self Care | Payer: BC Managed Care – PPO | Source: Ambulatory Visit | Attending: Cardiovascular Disease | Admitting: Cardiovascular Disease

## 2012-08-23 ENCOUNTER — Encounter (HOSPITAL_COMMUNITY): Payer: BC Managed Care – PPO

## 2012-08-26 ENCOUNTER — Encounter (HOSPITAL_COMMUNITY)
Admission: RE | Admit: 2012-08-26 | Discharge: 2012-08-26 | Disposition: A | Discharge status: Home / Self Care | Payer: BC Managed Care – PPO | Source: Ambulatory Visit | Attending: Cardiovascular Disease | Admitting: Cardiovascular Disease

## 2012-08-26 ENCOUNTER — Encounter (HOSPITAL_COMMUNITY): Payer: BC Managed Care – PPO

## 2012-08-28 ENCOUNTER — Encounter (HOSPITAL_COMMUNITY)
Admission: RE | Admit: 2012-08-28 | Discharge: 2012-08-28 | Disposition: A | Discharge status: Home / Self Care | Payer: BC Managed Care – PPO | Source: Ambulatory Visit | Attending: Cardiovascular Disease | Admitting: Cardiovascular Disease

## 2012-08-28 ENCOUNTER — Encounter (HOSPITAL_COMMUNITY): Payer: BC Managed Care – PPO

## 2012-08-30 ENCOUNTER — Encounter (HOSPITAL_COMMUNITY): Payer: BC Managed Care – PPO

## 2012-08-30 ENCOUNTER — Encounter (HOSPITAL_COMMUNITY)
Admission: RE | Admit: 2012-08-30 | Discharge: 2012-08-30 | Disposition: A | Discharge status: Home / Self Care | Payer: BC Managed Care – PPO | Source: Ambulatory Visit | Attending: Cardiovascular Disease | Admitting: Cardiovascular Disease

## 2012-08-30 NOTE — Progress Notes (Signed)
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  Dr Hulan Saas plans to continue exercise on his own.

## 2012-10-23 ENCOUNTER — Emergency Department (HOSPITAL_COMMUNITY): Payer: BC Managed Care – PPO

## 2012-10-23 ENCOUNTER — Encounter (HOSPITAL_COMMUNITY): Payer: Self-pay

## 2012-10-23 ENCOUNTER — Emergency Department (HOSPITAL_COMMUNITY)
Admission: EM | Admit: 2012-10-23 | Discharge: 2012-10-23 | Disposition: A | Discharge status: Home / Self Care | Payer: BC Managed Care – PPO | Attending: Emergency Medicine | Admitting: Emergency Medicine

## 2012-10-23 DIAGNOSIS — I10 Essential (primary) hypertension: Secondary | ICD-10-CM | POA: Insufficient documentation

## 2012-10-23 DIAGNOSIS — Z79899 Other long term (current) drug therapy: Secondary | ICD-10-CM | POA: Insufficient documentation

## 2012-10-23 DIAGNOSIS — K8020 Calculus of gallbladder without cholecystitis without obstruction: Secondary | ICD-10-CM | POA: Insufficient documentation

## 2012-10-23 DIAGNOSIS — R1110 Vomiting, unspecified: Secondary | ICD-10-CM | POA: Insufficient documentation

## 2012-10-23 DIAGNOSIS — Z87891 Personal history of nicotine dependence: Secondary | ICD-10-CM | POA: Insufficient documentation

## 2012-10-23 DIAGNOSIS — Z951 Presence of aortocoronary bypass graft: Secondary | ICD-10-CM | POA: Insufficient documentation

## 2012-10-23 DIAGNOSIS — Z7982 Long term (current) use of aspirin: Secondary | ICD-10-CM | POA: Insufficient documentation

## 2012-10-23 DIAGNOSIS — I252 Old myocardial infarction: Secondary | ICD-10-CM | POA: Insufficient documentation

## 2012-10-23 DIAGNOSIS — E785 Hyperlipidemia, unspecified: Secondary | ICD-10-CM | POA: Insufficient documentation

## 2012-10-23 DIAGNOSIS — I2510 Atherosclerotic heart disease of native coronary artery without angina pectoris: Secondary | ICD-10-CM | POA: Insufficient documentation

## 2012-10-23 DIAGNOSIS — K8050 Calculus of bile duct without cholangitis or cholecystitis without obstruction: Secondary | ICD-10-CM

## 2012-10-23 LAB — CBC WITH DIFFERENTIAL/PLATELET
Basophils Absolute: 0 10*3/uL (ref 0.0–0.1)
HCT: 39.6 % (ref 39.0–52.0)
Hemoglobin: 13.4 g/dL (ref 13.0–17.0)
Lymphocytes Relative: 11 % — ABNORMAL LOW (ref 12–46)
Monocytes Absolute: 0.7 10*3/uL (ref 0.1–1.0)
Monocytes Relative: 14 % — ABNORMAL HIGH (ref 3–12)
Neutro Abs: 3.8 10*3/uL (ref 1.7–7.7)
RBC: 5.11 MIL/uL (ref 4.22–5.81)
WBC: 5.1 10*3/uL (ref 4.0–10.5)

## 2012-10-23 LAB — COMPREHENSIVE METABOLIC PANEL
BUN: 18 mg/dL (ref 6–23)
Calcium: 9.5 mg/dL (ref 8.4–10.5)
GFR calc Af Amer: 76 mL/min — ABNORMAL LOW (ref >90)
Glucose, Bld: 101 mg/dL — ABNORMAL HIGH (ref 70–99)
Total Protein: 6.8 g/dL (ref 6.0–8.3)

## 2012-10-23 LAB — POCT I-STAT TROPONIN I

## 2012-10-23 NOTE — ED Provider Notes (Signed)
  Medical screening examination/treatment/procedure(s) were conducted as a shared visit with non-physician practitioner(s) and myself.  I personally evaluated the patient during the encounter  Please see my separate respective documentation pertaining to this patient encounter   Vida Roller, MD 10/23/12 [phone]

## 2012-10-23 NOTE — ED Notes (Signed)
PA at bedside.

## 2012-10-23 NOTE — ED Notes (Signed)
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 He has a history of appendectomy and hernia repair surgery presents with several days of abdominal bloating associated with nausea, seems to get worse after meals, had one episode of vomiting last night associated with right upper quadrant pain. On my exam the patient is in no acute distress, no peripheral edema, soft abdomen with tenderness in the right upper quadrant but no Murphy sign, no other abdominal tenderness, normal bowel sounds, clear heart and lung sounds. The patient will require ultrasound imaging of the gallbladder, laboratory workup, he declines pain medication at this time.   Medical screening examination/treatment/procedure(s) were conducted as a shared visit with non-physician practitioner(s) and myself.  I personally evaluated the patient during the encounter    Vida Roller, MD 10/23/12 [phone]

## 2012-10-23 NOTE — ED Provider Notes (Signed)
History     CSN: [id number]  Arrival date & time 10/23/12  4098   None     Chief Complaint  Patient presents with  . Abdominal Pain    (Consider location/radiation/quality/duration/timing/severity/associated sxs/prior treatment) HPI Comments: Patient is a 67 year old male with hx of NSTEMI, CABG x 5 in 04/2012 who presents for abdominal pain with onset last night. 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 Last evening after eating a large dinner patient states that he again felt increasingly full and bloated. He then developed an epigastric/RUQ pain around 4AM today that was focal and nonradiating. Patient states that he did not feel nauseous, but felt that vomiting might make him feel better. Patient endorses small amount of NB/NB emesis last night as well as 1 episode this AM after onset of pain. Patient admits to some relief of symptoms with emesis and denies aggravating factors. Patient denies fevers, CP, SOB, urinary symptoms, diarrhea, and numbness or tingling in his extremities. Last BM last night which was normal in color and more formed in consistency. Patient has hx of appendectomy and hernia repair.   PCP - Dr. Cyndia Bent; Surgeon - Donata Clay; GI - [place] (sp?)  Patient is a 68 y.o. male presenting with abdominal pain. The history is provided by the patient. No language interpreter was used.  Abdominal Pain Associated symptoms include abdominal pain and vomiting. Pertinent negatives include no chest pain, fever, nausea, numbness or weakness.    Past Medical History  Diagnosis Date  . No pertinent past medical history   . NSTEMI (non-ST elevated myocardial infarction) 04/16/12  . CAD, multiple vessel 04/17/12    RCA - mid 80%, distal 100% (R-R, L-R collaterals), LAD tandem prox& mid 70-80%,  LCx-OM 60-70% stenoses; Preserved EF  . Hyperlipidemia 04/17/2012  . 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 Appendectomy    . Coronary artery bypass graft  04/19/2012    Procedure: CORONARY ARTERY BYPASS GRAFTING (CABG);  Surgeon: Kerin Perna, MD;  Location: [facility] OR;  Service: Open Heart Surgery;  Laterality: N/A;  coronary artery bypass graft times five using left internal mammary artery and right leg saphenous vein  . Intraoperative transesophageal echocardiogram  04/19/2012    Procedure: INTRAOPERATIVE TRANSESOPHAGEAL ECHOCARDIOGRAM;  Surgeon: Kerin Perna, MD;  Location: [facility] OR;  Service: Open Heart Surgery;  Laterality: N/A;    No family history on file.  History  Substance Use Topics  . Smoking status: Former Smoker -- 0.25 packs/day for 1 years    Types: Cigarettes    Quit date: 04/20/1967  . Smokeless tobacco: Never Used  . Alcohol Use: No     Review of Systems  Constitutional: Negative for fever.  HENT: Negative for trouble swallowing.   Respiratory: Negative for shortness of breath.   Cardiovascular: Negative for chest pain.  Gastrointestinal: Positive for vomiting, abdominal pain and abdominal distention. Negative for nausea, diarrhea, blood in stool and anal bleeding.  Genitourinary: Negative for dysuria, frequency and hematuria.  Neurological: Negative for weakness and numbness.  All other systems reviewed and are negative.    Allergies  Ace inhibitors  Home Medications   Current Outpatient Rx  Name  Route  Sig  Dispense  Refill  . aspirin EC  81 MG EC tablet   Oral   Take 1 tablet (81 mg total) by mouth daily.         Marland Kitchen CINNAMON PO   Oral   Take 1 tablet by mouth daily.         Marland Kitchen Co-Enzyme Q-10 100 MG CAPS   Oral   Take 100 mg by mouth daily.          Marland Kitchen losartan (COZAAR) 25 MG tablet   Oral   Take 25 mg by mouth daily.         Marland Kitchen MAGNESIUM PO   Oral   Take 1 tablet by mouth daily.          . metoprolol tartrate (LOPRESSOR) 25 MG tablet   Oral   Take 1 tablet (25 mg total) by mouth 2 (two) times daily.   60 tablet   1   . Multiple Vitamin (MULTIVITAMIN WITH MINERALS) TABS   Oral   Take 1 tablet by mouth daily.         . Omega-3 Fatty Acids (FISH OIL PO)   Oral   Take 1 tablet by mouth daily.          . rosuvastatin (CRESTOR) 20 MG tablet   Oral   Take 1 tablet (20 mg total) by mouth daily at 6 PM.   30 tablet   1     BP 147/68  Pulse 53  Temp(Src) 98.4 F (36.9 C) (Oral)  Resp 23  SpO2 99%  Physical Exam  Nursing note and vitals reviewed. Constitutional: He is oriented to person, place, and time. He appears well-developed and well-nourished. No distress.  HENT:  Head: Normocephalic and atraumatic.  Mouth/Throat: Oropharynx is clear and moist. No oropharyngeal exudate.  Eyes: Conjunctivae and EOM are normal. Pupils are equal, round, and reactive to light. No scleral icterus.  Neck: Normal range of motion. Neck supple.  Cardiovascular: Normal rate, regular rhythm and intact distal pulses.   1/6 SEM  Pulmonary/Chest: Effort normal and breath sounds normal. No respiratory distress. He has no wheezes. He has no rales. He exhibits no tenderness.  Abdominal: Soft. Bowel sounds are normal. He exhibits no distension, no ascites, no pulsatile midline mass and no mass. There is tenderness in the right upper quadrant. There is no rigidity, no guarding and negative Murphy's sign.  Focal TTP in RUQ and mildly in epigastric region. No peritoneal signs or palpable masses appreciated.  Musculoskeletal: Normal range of motion. He exhibits no edema.  Neurological: He is alert and oriented to person, place, and time.  Skin: Skin is warm and dry. No rash noted. He is not diaphoretic. No erythema.  Psychiatric: He has a normal mood and affect. His behavior is normal.    ED Course  Procedures (including critical care time)  Labs Reviewed  COMPREHENSIVE  METABOLIC PANEL - Abnormal; Notable for the following:    Glucose, Bld 101 (*)    Total Bilirubin 1.4 (*)    GFR calc non Af Amer 66 (*)    GFR calc Af Amer 76 (*)    All other components within normal limits  CBC WITH DIFFERENTIAL - Abnormal; Notable for the following:    MCV 77.5 (*)    Platelets 126 (*)    Lymphocytes Relative 11 (*)    Lymphs Abs 0.6 (*)    Monocytes Relative 14 (*)    All other components within normal limits  LIPASE, BLOOD - Abnormal; Notable for the following:  Lipase 71 (*)    All other components within normal limits  URINALYSIS, ROUTINE W REFLEX MICROSCOPIC  POCT I-STAT TROPONIN I   US Abdomen Complete  10/23/2012   *RADIOLOGY REPORT*  Clinical Data:  Abdominal pain.  COMPLETE ABDOMINAL ULTRASOUND  Comparison:  None.  Findings:  Gallbladder:  Shadowing echogenic stones are seen within.  The wall measures up to 1.6 cm.  Negative sonographic Murphy's sign.  Common bile duct:  Measures 7 mm, at the upper limits of normal for age.  Liver:  A heterogeneous mass in the right hepatic lobe measures 6.0 x 5.8 x 5.8 cm and corresponds to a lesion of similar size on examination of 07/24/2012. Additional scattered anechoic lesions with increased through transmission measure up to 1.2 x 0.9 x 1.3 cm.  Parenchymal echogenicity is otherwise normal.  IVC:  Appears normal.  Pancreas:  Tail is obscured by bowel gas.  Otherwise, negative.  Spleen:  Measures measures 5.8 cm, negative.  Right Kidney:  Measures 12.0 cm.  Parenchymal echogenicity is normal.  Anechoic lesions with increased through transmission measure up to 1.4 x 1.0 x 1.3 cm, consistent with cysts.  No hydronephrosis.  Left Kidney:  Measures 11.9 cm.  Parenchymal echogenicity is normal.  Anechoic lesions with increased through transmission measure up to 1.7 x 0.8 x 1.3 cm, consistent with cysts.  No hydronephrosis.  Abdominal aorta:  No aneurysm identified.  IMPRESSION:  1.  Cholelithiasis with marked thickening of the  gallbladder wall. Negative sonographic Murphy's sign.  Findings can be seen with chronic cholecystitis. 2.  Complex right hepatic lobe mass, as on 07/24/2012.  A giant hemangioma can have this appearance.  As previously suggested, if further evaluation is desired, MR abdomen without and with contrast is recommended.  If a less aggressive approach is preferred, sonographic monitoring could be performed, as clinically indicated. 3.  Hepatic and bilateral renal cysts.   Original Report Authenticated By: Leanna Battles, M.D.     1. Biliary colic   2. Cholelithiasis      MDM  Patient is a 67 year old male with a history of CABG x5 in December 2013 with subsequent L hemidiaphragm as well as an abdominal surgical history of appendectomy and hernia repair who presents for epigastric and right upper quadrant pain with onset 3 hours ago. Physical exam significant for focal tenderness in the right upper quadrant without peritoneal signs or palpable masses. W/u to include EKG, CBC, CMP, lipase, troponin, UA and abdominal U/S for further evaluation of symptoms. Patient declining pain medicine at this time; states symptoms improved since onset this AM.  Lab significant for mildly elevated total bilirubin to 1.4. Ultrasound significant for cholelithiasis with gallbladder wall thickening, possibly from chronic cholecystitis. No evidence of acute cholecystitis and negative Murphy sign on my examination. Lipase elevated to 71 which is likely secondary to patient's cholelithiasis and biliary colic. Patient states symptoms have continued to resolve without pain medication. Have spoken with Dreama Saa at [facility] surgery regarding patient's case who has made patient appointment to see Dr. Biagio Quint on 10/25/12. Patient appropriate for discharge with general surgery followup. Indications for return discussed. Patient states comfort and understanding with this discharge plan with no unaddressed concerns.    Date:  10/23/2012  Rate: 52  Rhythm: sinus bradycardia  QRS Axis: normal  Intervals: normal  ST/T Wave abnormalities: normal  Conduction Disutrbances:none  Narrative Interpretation: Sinus bradycardia with probably left atrial abnormality; no STEMI or ischemic changes  Old EKG Reviewed: unchanged from  05/08/12     Antony Madura, PA-C 10/23/12 1006

## 2012-10-23 NOTE — ED Notes (Signed)
Pt returned from US

## 2012-10-23 NOTE — ED Notes (Signed)
Patient c/o intermittent abdominal soreness and frequent belching since December 2013. Presents today with abdominal pain x 1 day with nausea and 1 episode of vomiting. No constipation or diarrhea. No bloody or black stools. LBM 1 day ago. No fevers, sweats, chills or recent illness. No anorexia. Tolerating liquid and solids without difficulty.

## 2012-10-23 NOTE — ED Notes (Signed)
Family at bedside. 

## 2012-10-25 ENCOUNTER — Encounter (INDEPENDENT_AMBULATORY_CARE_PROVIDER_SITE_OTHER): Payer: Self-pay | Admitting: General Surgery

## 2012-10-25 ENCOUNTER — Ambulatory Visit (INDEPENDENT_AMBULATORY_CARE_PROVIDER_SITE_OTHER): Payer: BC Managed Care – PPO | Admitting: General Surgery

## 2012-10-25 VITALS — BP 122/68 | HR 51 | Temp 97.4°F | Resp 14 | Ht 71.0 in | Wt 178.0 lb

## 2012-10-25 DIAGNOSIS — R160 Hepatomegaly, not elsewhere classified: Secondary | ICD-10-CM

## 2012-10-25 DIAGNOSIS — K769 Liver disease, unspecified: Secondary | ICD-10-CM

## 2012-10-25 DIAGNOSIS — K8020 Calculus of gallbladder without cholecystitis without obstruction: Secondary | ICD-10-CM

## 2012-10-25 NOTE — Progress Notes (Signed)
Patient ID: Tyler Fox, male   DOB: [birth year], 67 y.o.   MRN: [id number]  Chief Complaint  Patient presents with  . New Evaluation    eval gallbladder    HPI Tyler Fox is a 67 y.o. male.  This patient presents for evaluation of abdominal pain and cholelithiasis. He says that he has had several episodes of abdominal pain in years past but never really attributed this to anything in particular. Earlier this week after eating chicken on Tuesday he began having some abdominal pain. This went away temporarily but returned and on Wednesday morning brought him to the emergency room for evaluation. He described this as a "pressure" in his epigastric area which was mostly relieved by the time he was actually seen by the emergency room physician. He said that he did have some residual tenderness in the right upper quadrant when examined by the emergency room physician.  He had some nausea associated with a severe episode this week but otherwise has not had any nausea or vomiting. He has not noticed any obvious food association and he is pain-free currently. He denies any blood in the stools or melena and he says he that he is current on his colonoscopy. He has not had any fevers or chills as well. He did have an ultrasound which demonstrated cholelithiasis as well as some gallbladder wall thickening as well as a complex right hepatic lobe mass possibly a hemangioma. HPI  Past Medical History  Diagnosis Date  . No pertinent past medical history   . NSTEMI (non-ST elevated myocardial infarction) 04/16/12  . 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 Hyperlipidemia 04/17/2012  . 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 Hypertension     Past Surgical History  Procedure Laterality Date  . Coronary artery bypass graft  04/19/2012    Procedure: CORONARY ARTERY BYPASS GRAFTING (CABG);  Surgeon:  Kerin Perna, MD;  Location: [facility] OR;  Service: Open Heart Surgery;  Laterality: N/A;  coronary artery bypass graft times five using left internal mammary artery and right leg saphenous vein  . Intraoperative transesophageal echocardiogram  04/19/2012    Procedure: INTRAOPERATIVE TRANSESOPHAGEAL ECHOCARDIOGRAM;  Surgeon: Kerin Perna, MD;  Location: [facility] OR;  Service: Open Heart Surgery;  Laterality: N/A;  . Hernia repair Left 01/2010    Left inguinal hernia repair  . Appendectomy  1969    Family History  Problem Relation Age of Onset  . Heart disease Father   . Cancer Sister     Breast  . Cancer Brother     Lung    Social History History  Substance Use Topics  . Smoking status: Former Smoker -- 0.25 packs/day for 1 years    Types: Cigarettes    Quit date: 04/20/1967  . Smokeless tobacco: Never Used  . Alcohol Use: No    Allergies  Allergen Reactions  . Ace Inhibitors Other (See Comments)    cough  . Lisinopril Cough    Current Outpatient Prescriptions  Medication Sig Dispense Refill  . aspirin EC 81 MG EC tablet Take 1 tablet (81 mg total) by mouth daily.      Marland Kitchen CINNAMON PO Take 1 tablet by mouth daily.      Marland Kitchen Co-Enzyme Q-10 100 MG CAPS Take 100 mg by mouth daily.       Marland Kitchen losartan (COZAAR) 25 MG  tablet Take 25 mg by mouth daily.      Marland Kitchen MAGNESIUM PO Take 1 tablet by mouth daily.      . metoprolol tartrate (LOPRESSOR) 25 MG tablet Take 1 tablet (25 mg total) by mouth 2 (two) times daily.  60 tablet  1  . Multiple Vitamin (MULTIVITAMIN WITH MINERALS) TABS Take 1 tablet by mouth daily.      . Omega-3 Fatty Acids (FISH OIL PO) Take 1 tablet by mouth daily.       . rosuvastatin (CRESTOR) 20 MG tablet Take 1 tablet (20 mg total) by mouth daily at 6 PM.  30 tablet  1   No current facility-administered medications for this visit.    Review of Systems Review of Systems All other review of systems negative or noncontributory except as stated in the HPI  Blood pressure  122/68, pulse 51, temperature 97.4 F (36.3 C), temperature source Temporal, resp. rate 14, height 5\' 11"  (1.803 m), weight 178 lb (80.74 kg), SpO2 98.00%.  Physical Exam Physical Exam All other review of systems negative or noncontributory except as stated in the HPI  Data Reviewed ER notes, labs, [place]  Assessment    Symptomatic cholelithiasis Liver mass I think that his symptoms are most likely consistent with symptomatic cholelithiasis. Given his ultrasound findings and his symptoms I think we do have enough justification to warrant surgery. I have offered him cholecystectomy. We discussed the procedure and its risks. The risks of infection, bleeding, pain, persistent symptoms, scarring, injury to bowel or bile ducts, retained stone, diarrhea, need for additional procedures, and need for open surgery discussed with the patient.  However, prior to any elective surgery, I think that he should have an MRI of of his liver to further evaluate this mass which was found on ultrasound. Although this is likely benign polyp I would like to have confirmation of this prior to any elective surgery. He is also scheduled to see a gastroenterologist for further evaluation of coughing and belching and reflux symptoms. He would like to take some time to think about the options of an have these further evaluations and he will call me back after his MRI to discuss results and to possibly schedule surgery if desired     Plan    MRI of the abdomen and followup after this to discuss results and to possibly discuss elective cholecystectomy        Onie Hayashi DAVID 10/25/2012, 1:22 PM

## 2012-11-05 ENCOUNTER — Ambulatory Visit
Admission: RE | Admit: 2012-11-05 | Discharge: 2012-11-05 | Disposition: A | Discharge status: Home / Self Care | Payer: BC Managed Care – PPO | Source: Ambulatory Visit | Attending: General Surgery | Admitting: General Surgery

## 2012-11-05 DIAGNOSIS — K8020 Calculus of gallbladder without cholecystitis without obstruction: Secondary | ICD-10-CM

## 2012-11-05 DIAGNOSIS — R160 Hepatomegaly, not elsewhere classified: Secondary | ICD-10-CM

## 2012-11-05 MED ORDER — GADOBENATE DIMEGLUMINE 529 MG/ML IV SOLN
17.0000 mL | Freq: Once | INTRAVENOUS | Status: AC | PRN
  Administered 2012-11-05: 17 mL via INTRAVENOUS

## 2012-11-10 ENCOUNTER — Other Ambulatory Visit (HOSPITAL_COMMUNITY): Payer: Self-pay | Admitting: Cardiovascular Disease

## 2012-11-11 NOTE — Telephone Encounter (Signed)
Rx was sent to pharmacy electronically. 

## 2012-11-20 ENCOUNTER — Telehealth (INDEPENDENT_AMBULATORY_CARE_PROVIDER_SITE_OTHER): Payer: Self-pay | Admitting: General Surgery

## 2012-11-20 NOTE — Telephone Encounter (Signed)
I spoke with the patient on the phone to discuss his MRI results.  Liver mass c/w hemangioma.  He would still like to wait before proceeding with cholecystectomy.  He said that he would call me back when he would like to go ahead and schedule his procedure.

## 2012-12-18 ENCOUNTER — Other Ambulatory Visit: Payer: Self-pay

## 2013-02-01 ENCOUNTER — Other Ambulatory Visit: Payer: Self-pay | Admitting: Cardiovascular Disease

## 2013-02-03 NOTE — Telephone Encounter (Signed)
Rx was sent to pharmacy electronically. 

## 2013-02-05 ENCOUNTER — Ambulatory Visit: Payer: BC Managed Care – PPO | Admitting: Cardiovascular Disease

## 2013-02-07 ENCOUNTER — Ambulatory Visit: Payer: BC Managed Care – PPO | Admitting: Cardiovascular Disease

## 2013-02-19 ENCOUNTER — Ambulatory Visit (INDEPENDENT_AMBULATORY_CARE_PROVIDER_SITE_OTHER): Payer: BC Managed Care – PPO | Admitting: Cardiovascular Disease

## 2013-02-19 ENCOUNTER — Encounter: Payer: Self-pay | Admitting: Cardiovascular Disease

## 2013-02-19 VITALS — BP 138/74 | HR 53 | Ht 71.0 in | Wt 183.8 lb

## 2013-02-19 DIAGNOSIS — I10 Essential (primary) hypertension: Secondary | ICD-10-CM | POA: Insufficient documentation

## 2013-02-19 DIAGNOSIS — I2510 Atherosclerotic heart disease of native coronary artery without angina pectoris: Secondary | ICD-10-CM

## 2013-02-19 DIAGNOSIS — E785 Hyperlipidemia, unspecified: Secondary | ICD-10-CM

## 2013-02-19 DIAGNOSIS — Z79899 Other long term (current) drug therapy: Secondary | ICD-10-CM

## 2013-02-19 DIAGNOSIS — R001 Bradycardia, unspecified: Secondary | ICD-10-CM

## 2013-02-19 DIAGNOSIS — I498 Other specified cardiac arrhythmias: Secondary | ICD-10-CM

## 2013-02-19 NOTE — Assessment & Plan Note (Signed)
History of non-ST segment elevation myocardial infarction 04/16/12. He underwent coronary bypass grafting x5 by Dr. Kathlee Nations Trigt 04/19/12 with a LIMA to his LAD, a vein to diagonal branch, obtuse marginal branch and distal circumflex as well as a vein to the PDA. His postop course was uncomplicated. He denies chest pain or shortness of breath.

## 2013-02-19 NOTE — Patient Instructions (Signed)
Your physician has recommended you make the following change in your medication: Decrease Crestor to 10mg  every other day.  Please have fasting blood work done. We will call you with the results.  Your physician wants you to follow-up in: 6 months with Dr. Allyson Sabal. You will receive a reminder letter in the mail two months in advance. If you don't receive a letter, please call our office to schedule the follow-up appointment.

## 2013-02-19 NOTE — Assessment & Plan Note (Signed)
On statin therapy as well as Co Q 10 . He complains of some statin myalgias and arthralgias. I'm going to decrease his dose from 20 mg daily to 10 mg every other day I will recheck lab work in 2 months.

## 2013-02-19 NOTE — Assessment & Plan Note (Signed)
Well-controlled on current medications 

## 2013-02-19 NOTE — Progress Notes (Signed)
02/19/2013 Tyler Fox   05-07-1946  [id number]  Primary Physician Tyler Inch, MD Primary Cardiologist: Tyler Gess MD Tyler Fox   HPI:  The patient is a 67 year old, thin and fit-appearing, married [place] American male, father of 2, grandfather to 1 grandchild who I last saw approximately 3 months ago. He presented to [facility] April 16, 2012, with a non-ST-segment-elevation myocardial infarction. He was catheterized by Dr. Bryan Fox radially on April 17, 2012, revealing 2-vessel disease. His EF was normal with mild interior hypokinesia. He underwent coronary artery bypass grafting x5 by Dr. Kathlee Nations Fox April 19, 2012, with a LIMA to his LAD, a vein to his diagonal branch, a vein to an obtuse marginal branch and distal circumflex. There was also a vein to a PDA. His postop course was uncomplicated. He was seen at [facility] May 07, 2012, with delirium and was found to have a UTI. Cultures revealed Klebsiella pneumoniae. He was treated with Keflex and subsequently improved. I wanted to get a stress test on him to demonstrate efficacy of revascularization; however, this was denied by the insurance company. He is asymptomatic, however, and is currently participating in cardiac rehab. Recent lab work performed August 09, 2012, revealed total cholesterol of 144, LDL of 65, HDL of 64. Since I saw him 6 months ago he has remained remarkably stable. Only complaints are as some myalgias and arthralgias potentially a side effect of his statin therapy.    Current Outpatient Prescriptions  Medication Sig Dispense Refill  . aspirin EC 81 MG EC tablet Take 1 tablet (81 mg total) by mouth daily.      Marland Kitchen CINNAMON PO Take 1 tablet by mouth daily.      Marland Kitchen Co-Enzyme Q-10 100 MG CAPS Take 100 mg by mouth daily.       Marland Kitchen losartan (COZAAR) 25 MG tablet TAKE 1 TABLET BY MOUTH EVERY DAY  30 tablet  11  . MAGNESIUM PO Take 1 tablet by mouth daily.      .  metoprolol tartrate (LOPRESSOR) 25 MG tablet Take 1 tablet (25 mg total) by mouth 2 (two) times daily.  60 tablet  7  . Multiple Vitamin (MULTIVITAMIN WITH MINERALS) TABS Take 1 tablet by mouth daily.      . Omega-3 Fatty Acids (FISH OIL PO) Take 1 tablet by mouth daily.       . pantoprazole (PROTONIX) 40 MG tablet Take 40 mg by mouth 2 (two) times daily.      . rosuvastatin (CRESTOR) 20 MG tablet Take 10 mg by mouth every other day.       No current facility-administered medications for this visit.    Allergies  Allergen Reactions  . Ace Inhibitors Other (See Comments)    cough  . Lisinopril Cough    History   Social History  . Marital Status: Married    Spouse Name: N/A    Number of Children: N/A  . Years of Education: N/A   Occupational History  . Not on file.   Social History Main Topics  . Smoking status: Former Smoker -- 0.25 packs/day for 1 years    Types: Cigarettes    Quit date: 04/20/1967  . Smokeless tobacco: Never Used  . Alcohol Use: No  . Drug Use: No  . Sexual Activity: Not on file   Other Topics Concern  . Not on file   Social History Narrative  . No narrative on file  Review of Systems: General: negative for chills, fever, night sweats or weight changes.  Cardiovascular: negative for chest pain, dyspnea on exertion, edema, orthopnea, palpitations, paroxysmal nocturnal dyspnea or shortness of breath Dermatological: negative for rash Respiratory: negative for cough or wheezing Urologic: negative for hematuria Abdominal: negative for nausea, vomiting, diarrhea, bright red blood per rectum, melena, or hematemesis Neurologic: negative for visual changes, syncope, or dizziness All other systems reviewed and are otherwise negative except as noted above.    Blood pressure 138/74, pulse 53, height 5\' 11"  (1.803 m), weight 183 lb 12.8 oz (83.371 kg).  General appearance: alert and no distress Neck: no adenopathy, no carotid bruit, no JVD, supple,  symmetrical, trachea midline and thyroid not enlarged, symmetric, no tenderness/mass/nodules Lungs: clear to auscultation bilaterally Heart: regular rate and rhythm, S1, S2 normal, no murmur, click, rub or gallop Extremities: extremities normal, atraumatic, no cyanosis or edema  EKG sinus bradycardia at 53 without ST or T wave changes  ASSESSMENT AND PLAN:   CAD, multiple vessel History of non-ST segment elevation myocardial infarction 04/16/12. He underwent coronary bypass grafting x5 by Dr. Kathlee Nations Fox 04/19/12 with a LIMA to his LAD, a vein to diagonal branch, obtuse marginal branch and distal circumflex as well as a vein to the PDA. His postop course was uncomplicated. He denies chest pain or shortness of breath.  Hyperlipidemia On statin therapy as well as Co Q 10 . He complains of some statin myalgias and arthralgias. I'm going to decrease his dose from 20 mg daily to 10 mg every other day I will recheck lab work in 2 months.  Essential hypertension Well-controlled on current medications      Tyler Gess MD [facility], [facility] 02/19/2013 5:38 PM

## 2013-02-20 ENCOUNTER — Encounter: Payer: Self-pay | Admitting: Cardiovascular Disease

## 2013-02-27 ENCOUNTER — Ambulatory Visit: Payer: BC Managed Care – PPO | Admitting: Cardiovascular Disease

## 2013-03-20 ENCOUNTER — Other Ambulatory Visit: Payer: Self-pay

## 2013-03-31 ENCOUNTER — Encounter (HOSPITAL_COMMUNITY): Payer: Self-pay | Admitting: Emergency Medicine

## 2013-03-31 ENCOUNTER — Telehealth (INDEPENDENT_AMBULATORY_CARE_PROVIDER_SITE_OTHER): Payer: Self-pay | Admitting: General Surgery

## 2013-03-31 ENCOUNTER — Observation Stay (HOSPITAL_COMMUNITY)
Admission: EM | Admit: 2013-03-31 | Discharge: 2013-04-02 | Disposition: A | Discharge status: Home / Self Care | Payer: BC Managed Care – PPO | Attending: General Surgery | Admitting: General Surgery

## 2013-03-31 ENCOUNTER — Emergency Department (HOSPITAL_COMMUNITY): Payer: BC Managed Care – PPO

## 2013-03-31 DIAGNOSIS — R112 Nausea with vomiting, unspecified: Secondary | ICD-10-CM

## 2013-03-31 DIAGNOSIS — R109 Unspecified abdominal pain: Secondary | ICD-10-CM

## 2013-03-31 DIAGNOSIS — Z951 Presence of aortocoronary bypass graft: Secondary | ICD-10-CM | POA: Insufficient documentation

## 2013-03-31 DIAGNOSIS — R7989 Other specified abnormal findings of blood chemistry: Secondary | ICD-10-CM

## 2013-03-31 DIAGNOSIS — K811 Chronic cholecystitis: Principal | ICD-10-CM | POA: Insufficient documentation

## 2013-03-31 DIAGNOSIS — I2510 Atherosclerotic heart disease of native coronary artery without angina pectoris: Secondary | ICD-10-CM | POA: Insufficient documentation

## 2013-03-31 DIAGNOSIS — N281 Cyst of kidney, acquired: Secondary | ICD-10-CM | POA: Insufficient documentation

## 2013-03-31 DIAGNOSIS — K8020 Calculus of gallbladder without cholecystitis without obstruction: Secondary | ICD-10-CM

## 2013-03-31 DIAGNOSIS — I252 Old myocardial infarction: Secondary | ICD-10-CM | POA: Insufficient documentation

## 2013-03-31 HISTORY — DX: Calculus of gallbladder without cholecystitis without obstruction: K80.20

## 2013-03-31 LAB — URINE MICROSCOPIC-ADD ON

## 2013-03-31 LAB — URINALYSIS, ROUTINE W REFLEX MICROSCOPIC
Glucose, UA: NEGATIVE mg/dL
Hgb urine dipstick: NEGATIVE
Protein, ur: 30 mg/dL — AB
pH: 6.5 (ref 5.0–8.0)

## 2013-03-31 LAB — CBC WITH DIFFERENTIAL/PLATELET
Basophils Absolute: 0 10*3/uL (ref 0.0–0.1)
Basophils Relative: 0 % (ref 0–1)
Lymphocytes Relative: 8 % — ABNORMAL LOW (ref 12–46)
MCHC: 33.7 g/dL (ref 30.0–36.0)
Neutro Abs: 6.3 10*3/uL (ref 1.7–7.7)
Neutrophils Relative %: 83 % — ABNORMAL HIGH (ref 43–77)
Platelets: 132 10*3/uL — ABNORMAL LOW (ref 150–400)
RDW: 13.2 % (ref 11.5–15.5)
WBC: 7.7 10*3/uL (ref 4.0–10.5)

## 2013-03-31 LAB — SURGICAL PCR SCREEN
MRSA, PCR: NEGATIVE
Staphylococcus aureus: NEGATIVE

## 2013-03-31 LAB — COMPREHENSIVE METABOLIC PANEL
ALT: 29 U/L (ref 0–53)
AST: 40 U/L — ABNORMAL HIGH (ref 0–37)
Albumin: 4.3 g/dL (ref 3.5–5.2)
CO2: 27 mEq/L (ref 19–32)
Calcium: 9.5 mg/dL (ref 8.4–10.5)
Chloride: 103 mEq/L (ref 96–112)
GFR calc non Af Amer: 64 mL/min — ABNORMAL LOW (ref >90)
Sodium: 140 mEq/L (ref 135–145)
Total Bilirubin: 1.7 mg/dL — ABNORMAL HIGH (ref 0.3–1.2)

## 2013-03-31 MED ORDER — SODIUM CHLORIDE 0.9 % IV SOLN
INTRAVENOUS | Status: DC
  Administered 2013-03-31 – 2013-04-02 (×4): via INTRAVENOUS

## 2013-03-31 MED ORDER — LOSARTAN POTASSIUM 25 MG PO TABS
25.0000 mg | ORAL_TABLET | Freq: Every day | ORAL | Status: DC
  Administered 2013-04-02: 25 mg via ORAL
  Filled 2013-03-31 (×2): qty 1

## 2013-03-31 MED ORDER — MORPHINE SULFATE 2 MG/ML IJ SOLN: 2.0000 mg | INTRAMUSCULAR | Status: DC | PRN

## 2013-03-31 MED ORDER — SODIUM CHLORIDE 0.9 % IV BOLUS (SEPSIS): 500.0000 mL | Freq: Once | INTRAVENOUS | Status: DC

## 2013-03-31 MED ORDER — ONDANSETRON HCL 4 MG/2ML IJ SOLN: 4.0000 mg | Freq: Four times a day (QID) | INTRAMUSCULAR | Status: DC | PRN

## 2013-03-31 MED ORDER — ATORVASTATIN CALCIUM 80 MG PO TABS
80.0000 mg | ORAL_TABLET | Freq: Every day | ORAL | Status: DC
  Filled 2013-03-31 (×2): qty 1

## 2013-03-31 MED ORDER — PIPERACILLIN-TAZOBACTAM 3.375 G IVPB
3.3750 g | Freq: Three times a day (TID) | INTRAVENOUS | Status: DC
  Administered 2013-03-31 – 2013-04-01 (×2): 3.375 g via INTRAVENOUS
  Filled 2013-03-31 (×4): qty 50

## 2013-03-31 MED ORDER — PANTOPRAZOLE SODIUM 40 MG PO TBEC
40.0000 mg | DELAYED_RELEASE_TABLET | Freq: Two times a day (BID) | ORAL | Status: DC
  Administered 2013-03-31 – 2013-04-02 (×3): 40 mg via ORAL
  Filled 2013-03-31 (×4): qty 1

## 2013-03-31 MED ORDER — PIPERACILLIN-TAZOBACTAM 3.375 G IVPB 30 MIN
3.3750 g | INTRAVENOUS | Status: AC
  Administered 2013-03-31: 3.375 g via INTRAVENOUS
  Filled 2013-03-31: qty 50

## 2013-03-31 MED ORDER — HYDROCODONE-ACETAMINOPHEN 5-325 MG PO TABS
1.0000 | ORAL_TABLET | ORAL | Status: DC | PRN
  Administered 2013-04-01: 1 via ORAL
  Filled 2013-03-31: qty 1

## 2013-03-31 MED ORDER — ENOXAPARIN SODIUM 40 MG/0.4ML ~~LOC~~ SOLN
40.0000 mg | SUBCUTANEOUS | Status: DC
  Administered 2013-03-31 – 2013-04-01 (×2): 40 mg via SUBCUTANEOUS
  Filled 2013-03-31 (×4): qty 0.4

## 2013-03-31 MED ORDER — METOPROLOL TARTRATE 25 MG PO TABS
25.0000 mg | ORAL_TABLET | Freq: Two times a day (BID) | ORAL | Status: DC
  Administered 2013-03-31 – 2013-04-02 (×2): 25 mg via ORAL
  Filled 2013-03-31 (×6): qty 1

## 2013-03-31 NOTE — Progress Notes (Signed)
ANTIBIOTIC CONSULT NOTE - INITIAL  Pharmacy Consult for Zosyn Indication: possible gall bladder infection - for cholecystectomy 11/18  Allergies  Allergen Reactions  . Ace Inhibitors Other (See Comments)    cough  . Lisinopril Cough  . Oxycodone     Hallucinations    Patient Measurements:   Adjusted Body Weight: 84 kg  Vital Signs: Temp: 97.5 F (36.4 C) (11/17 1434) Temp src: Oral (11/17 1434) BP: 117/47 mmHg (11/17 1415) Pulse Rate: 44 (11/17 1415) Intake/Output from previous day:   Intake/Output from this shift:    Labs:  Recent Labs  03/31/13 1419  WBC 7.7  HGB 14.4  PLT 132*  CREATININE 1.13   The CrCl is unknown because both a height and weight (above a minimum accepted value) are required for this calculation. No results found for this basename: VANCOTROUGH, VANCOPEAK, VANCORANDOM, GENTTROUGH, GENTPEAK, GENTRANDOM, TOBRATROUGH, TOBRAPEAK, TOBRARND, AMIKACINPEAK, AMIKACINTROU, AMIKACIN,  in the last 72 hours   Microbiology: No results found for this or any previous visit (from the past 720 hour(s)).  Medical History: Past Medical History  Diagnosis Date  . No pertinent past medical history   . NSTEMI (non-ST elevated myocardial infarction) 04/16/12  . 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 Hyperlipidemia 04/17/2012  . 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 Hypertension   . Gallstones     Medications:  See home med list Assessment: 67 year old man to be admitted from the ED.  There is a planned cholecystectomy for 11/18.    Goal of Therapy:  treat infection  Plan:  Zosyn 3.375g IV q8h (infuse over 4 hours) Follow renal function and cultures  Mickeal Skinner 03/31/2013,5:49 PM

## 2013-03-31 NOTE — ED Notes (Signed)
Attempted report 

## 2013-03-31 NOTE — Telephone Encounter (Signed)
Pt called for Dr. Crecencio Mc report he is having another GB attack.  Onset this morning and seems to be getting worse.  Suggested he consider going to the ER for assessment and pain relief, but will send message to Dr. Biagio Quint as well.  Pt understands.

## 2013-03-31 NOTE — ED Provider Notes (Signed)
CSN: [id number]     Arrival date & time 03/31/13  1406 History   First MD Initiated Contact with Patient 03/31/13 1510     Chief Complaint  Patient presents with  . Abdominal Pain   (Consider location/radiation/quality/duration/timing/severity/associated sxs/prior Treatment) HPI Pt presenting with c/o upper abdominal pain.  Associated with nausea and vomiting.  Symptoms began this morning approx 8am.  He has hx of gallstones and has been seen by surgery- was recommended to have a cholecystectomy which he has not scheduled as of yet.  No chest pain.  Pt has hx of CABG 12/13 and has been recovering well from this.  No fever/chills.  Has not been able to keep down liquids today.  Has had 2 formed stools today.  No dysuria.  No difficulty breathing.  Pain also associated with increased belching.  Pain worse with palpation.  Symptoms began after eating breakfast. There are no other associated systemic symptoms, there are no other alleviating or modifying factors.   Past Medical History  Diagnosis Date  . No pertinent past medical history   . NSTEMI (non-ST elevated myocardial infarction) 04/16/12  . 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 Hyperlipidemia 04/17/2012  . 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 Hypertension   . Gallstones    Past Surgical History  Procedure Laterality Date  . Coronary artery bypass graft  04/19/2012    Procedure: CORONARY ARTERY BYPASS GRAFTING (CABG);  Surgeon: Kerin Perna, MD;  Location: [facility] OR;  Service: Open Heart Surgery;  Laterality: N/A;  coronary artery bypass graft times five using left internal mammary artery and right leg saphenous vein  . Intraoperative transesophageal echocardiogram  04/19/2012    Procedure: INTRAOPERATIVE TRANSESOPHAGEAL ECHOCARDIOGRAM;  Surgeon: Kerin Perna, MD;  Location: [facility] OR;  Service: Open Heart Surgery;  Laterality:  N/A;  . Hernia repair Left 01/2010    Left inguinal hernia repair  . Appendectomy  1969   Family History  Problem Relation Age of Onset  . Heart disease Father   . Cancer Sister     Breast  . Cancer Brother     Lung   History  Substance Use Topics  . Smoking status: Former Smoker -- 0.25 packs/day for 1 years    Types: Cigarettes    Quit date: 04/20/1967  . Smokeless tobacco: Never Used  . Alcohol Use: No    Review of Systems ROS reviewed and all otherwise negative except for mentioned in HPI  Allergies  Ace inhibitors; Lisinopril; and Oxycodone  Home Medications   Current Outpatient Rx  Name  Route  Sig  Dispense  Refill  . aspirin EC 81 MG EC tablet   Oral   Take 1 tablet (81 mg total) by mouth daily.         Marland Kitchen Co-Enzyme Q-10 100 MG CAPS   Oral   Take 100 mg by mouth daily.          Marland Kitchen losartan (COZAAR) 25 MG tablet   Oral   Take 25 mg by mouth daily.         . metoprolol tartrate (LOPRESSOR) 25 MG tablet   Oral   Take 1 tablet (25 mg total) by mouth 2 (two) times daily.   60 tablet   7   . Multiple Vitamin (MULTIVITAMIN WITH MINERALS) TABS   Oral   Take 1 tablet  by mouth daily.         . Omega-3 Fatty Acids (FISH OIL PO)   Oral   Take 1 tablet by mouth daily.          . pantoprazole (PROTONIX) 40 MG tablet   Oral   Take 40 mg by mouth 2 (two) times daily.         . rosuvastatin (CRESTOR) 20 MG tablet   Oral   Take 10 mg by mouth every other day.          BP 117/47  Pulse 44  Temp(Src) 97.5 F (36.4 C) (Oral)  Resp 16  SpO2 99% Vitals reviewed Physical Exam Physical Examination: General appearance - alert, well appearing, and in no distress Mental status - alert, oriented to person, place, and time Eyes - no scleral icterus, no conjunctival injection Mouth - mucous membranes moist, pharynx normal without lesions Chest - clear to auscultation, no wheezes, rales or rhonchi, symmetric air entry Heart - normal rate, regular  rhythm, normal S1, S2, no murmurs, rubs, clicks or gallops Abdomen - soft, ttp in epigastric and RUQ, nondistended, no masses or organomegaly, nabs Extremities - peripheral pulses normal, no pedal edema, no clubbing or cyanosis  Skin - normal coloration and turgor, no rashes  ED Course  Procedures (including critical care time)  3:49 PM pt declines pain or nausea medication at this time  5:09 PM d/w Dr. Rayburn Ma, surgery, he will see patient in the ED Labs Review Labs Reviewed  CBC WITH DIFFERENTIAL - Abnormal; Notable for the following:    Platelets 132 (*)    Neutrophils Relative % 83 (*)    Lymphocytes Relative 8 (*)    Lymphs Abs 0.6 (*)    All other components within normal limits  COMPREHENSIVE METABOLIC PANEL - Abnormal; Notable for the following:    Glucose, Bld 172 (*)    AST 40 (*)    Total Bilirubin 1.7 (*)    GFR calc non Af Amer 64 (*)    GFR calc Af Amer 75 (*)    All other components within normal limits  URINALYSIS, ROUTINE W REFLEX MICROSCOPIC - Abnormal; Notable for the following:    Specific Gravity, Urine 1.031 (*)    Bilirubin Urine SMALL (*)    Ketones, ur 15 (*)    Protein, ur 30 (*)    Leukocytes, UA TRACE (*)    All other components within normal limits  URINE MICROSCOPIC-ADD ON - Abnormal; Notable for the following:    Squamous Epithelial / LPF FEW (*)    Bacteria, UA FEW (*)    Casts HYALINE CASTS (*)    All other components within normal limits  LIPASE, BLOOD  COMPREHENSIVE METABOLIC PANEL  POCT I-STAT TROPONIN I   Imaging Review US Abdomen Complete  03/31/2013   CLINICAL DATA:  Abdominal pain  EXAM: ULTRASOUND ABDOMEN COMPLETE  COMPARISON:  Abdominal MRI 11/05/2012  FINDINGS: Gallbladder  Mobile echogenic foci with posterior acoustic shadowing consistent with cholelithiasis. The largest stone measures 1 cm in diameter. There is mild thickening of the hepatic wall of the gallbladder measuring up to 4 mm. In regions, the wall appears thicker,  but this likely represents folding of the gallbladder. Per the sonographer, the sonographic Eulah Pont sign was negative.  Common bile duct  Diameter: Within normal limits at 6 mm.  Liver  Round solid and mildly hyperechoic lesion in the right hepatic lobe measures 5.1 x 4.1 x 4.6 cm. This corresponds with the  previously described cavernous hemangioma. Background hepatic parenchyma is within normal limits. Numerous small cystic lesions throughout the left hepatic lobe as seen on prior MRI.  IVC  No abnormality visualized.  Pancreas  Visualized portion unremarkable.  Spleen  Size and appearance within normal limits.  Right Kidney  Length: 12.1 cm. Echogenicity within normal limits. No solid mass or hydronephrosis visualized. Simple renal cysts as seen on prior MRI without significant interval change.  Left Kidney  Length: 12.2 cm. Echogenicity within normal limits. No solid mass or hydronephrosis visualized. Simple renal cysts as seen on prior MRI without significant interval change.  Abdominal aorta  No aneurysm visualized. Bifurcation not well seen secondary to obscuring bowel gas.  IMPRESSION: 1. Cholelithiasis and mild gallbladder wall thickening without a positive sonographic Murphy sign. Findings raise concern for chronic cholecystitis, but do not entirely exclude acute cholecystitis. If clinical concern persists for acute cholecystitis, consider nuclear medicine HIDA scan. 2. 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 3. 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   Electronically Signed   By: Malachy Moan M.D.   On: 03/31/2013 16:44    EKG Interpretation    Date/Time:  Monday March 31 2013 14:10:42 EST Ventricular Rate:  44 PR Interval:  156 QRS Duration: 84 QT Interval:  482 QTC Calculation: 412 R Axis:   80 Text Interpretation:  Marked sinus bradycardia ST abnormality, possible  digitalis effect Abnormal ECG Since previous tracing rate has slowed            MDM   1. Symptomatic cholelithiasis   2. Elevated liver function tests   3. Abdominal pain   4. Nausea and vomiting    Pt with hx of gallstones presenting with epigastric and right upper abdominal pain. Labs show mild elevation in AST and tbili.  Pt declines to take any pain medications.  abd US shows gall bladder wall thickening and gallstones.  Pt seen by surgery and will be admitted by their service.     Ethelda Chick, MD 03/31/13 [phone]

## 2013-03-31 NOTE — H&P (Signed)
Tyler Fox is an 67 y.o. male.   Chief Complaint: Right upper quadrant abdominal pain HPI: This is a gentleman who was actually seen by Dr. Biagio Quint Earlier this year with symptomatic cholelithiasis. A laparoscopic cholecystectomy was recommended. The patient decided to wait.  He had a mild attack in August of right-sided abdominal pain which resolved quickly. This morning, after breakfast, he developed right-sided abdominal pain again as well as an episode of nausea and vomiting. This pain lasted longer. He described it as sharp. It was moderate in intensity and did not for anywhere else. He is now pain free  Past Medical History  Diagnosis Date  . No pertinent past medical history   . NSTEMI (non-ST elevated myocardial infarction) 04/16/12  . 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 Hyperlipidemia 04/17/2012  . 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 Hypertension   . Gallstones     Past Surgical History  Procedure Laterality Date  . Coronary artery bypass graft  04/19/2012    Procedure: CORONARY ARTERY BYPASS GRAFTING (CABG);  Surgeon: Kerin Perna, MD;  Location: [facility] OR;  Service: Open Heart Surgery;  Laterality: N/A;  coronary artery bypass graft times five using left internal mammary artery and right leg saphenous vein  . Intraoperative transesophageal echocardiogram  04/19/2012    Procedure: INTRAOPERATIVE TRANSESOPHAGEAL ECHOCARDIOGRAM;  Surgeon: Kerin Perna, MD;  Location: [facility] OR;  Service: Open Heart Surgery;  Laterality: N/A;  . Hernia repair Left 01/2010    Left inguinal hernia repair  . Appendectomy  1969    Family History  Problem Relation Age of Onset  . Heart disease Father   . Cancer Sister     Breast  . Cancer Brother     Lung   Social History:  reports that he quit smoking about 45 years ago. His smoking use included Cigarettes. He has a .25 pack-year  smoking history. He has never used smokeless tobacco. He reports that he does not drink alcohol or use illicit drugs.  Allergies:  Allergies  Allergen Reactions  . Ace Inhibitors Other (See Comments)    cough  . Lisinopril Cough  . Oxycodone     Hallucinations     (Not in a hospital admission)  Results for orders placed during the hospital encounter of 03/31/13 (from the past 48 hour(s))  CBC WITH DIFFERENTIAL     Status: Abnormal   Collection Time    03/31/13  2:19 PM      Result Value Range   WBC 7.7  4.0 - 10.5 K/uL   RBC 5.21  4.22 - 5.81 MIL/uL   Hemoglobin 14.4  13.0 - 17.0 g/dL   HCT 45.4  09.8 - 11.9 %   MCV 82.0  78.0 - 100.0 fL   MCH 27.6  26.0 - 34.0 pg   MCHC 33.7  30.0 - 36.0 g/dL   RDW 14.7  82.9 - 56.2 %   Platelets 132 (*) 150 - 400 K/uL   Neutrophils Relative % 83 (*) 43 - 77 %   Neutro Abs 6.3  1.7 - 7.7 K/uL   Lymphocytes Relative 8 (*) 12 - 46 %   Lymphs Abs 0.6 (*) 0.7 - 4.0 K/uL   Monocytes Relative 9  3 - 12 %   Monocytes Absolute 0.7  0.1 - 1.0 K/uL   Eosinophils Relative 0  0 -  5 %   Eosinophils Absolute 0.0  0.0 - 0.7 K/uL   Basophils Relative 0  0 - 1 %   Basophils Absolute 0.0  0.0 - 0.1 K/uL  COMPREHENSIVE METABOLIC PANEL     Status: Abnormal   Collection Time    03/31/13  2:19 PM      Result Value Range   Sodium 140  135 - 145 mEq/L   Potassium 4.3  3.5 - 5.1 mEq/L   Chloride 103  96 - 112 mEq/L   CO2 27  19 - 32 mEq/L   Glucose, Bld 172 (*) 70 - 99 mg/dL   BUN 21  6 - 23 mg/dL   Creatinine, Ser 1.61  0.50 - 1.35 mg/dL   Calcium 9.5  8.4 - 09.6 mg/dL   Total Protein 7.0  6.0 - 8.3 g/dL   Albumin 4.3  3.5 - 5.2 g/dL   AST 40 (*) 0 - 37 U/L   ALT 29  0 - 53 U/L   Alkaline Phosphatase 49  39 - 117 U/L   Total Bilirubin 1.7 (*) 0.3 - 1.2 mg/dL   GFR calc non Af Amer 64 (*) >90 mL/min   GFR calc Af Amer 75 (*) >90 mL/min   Comment: (NOTE)     The eGFR has been calculated using the CKD EPI equation.     This calculation has not been  validated in all clinical situations.     eGFR's persistently <90 mL/min signify possible Chronic Kidney     Disease.  LIPASE, BLOOD     Status: None   Collection Time    03/31/13  2:19 PM      Result Value Range   Lipase 53  11 - 59 U/L  URINALYSIS, ROUTINE W REFLEX MICROSCOPIC     Status: Abnormal   Collection Time    03/31/13  2:46 PM      Result Value Range   Color, Urine YELLOW  YELLOW   APPearance CLEAR  CLEAR   Specific Gravity, Urine 1.031 (*) 1.005 - 1.030   pH 6.5  5.0 - 8.0   Glucose, UA NEGATIVE  NEGATIVE mg/dL   Hgb urine dipstick NEGATIVE  NEGATIVE   Bilirubin Urine SMALL (*) NEGATIVE   Ketones, ur 15 (*) NEGATIVE mg/dL   Protein, ur 30 (*) NEGATIVE mg/dL   Urobilinogen, UA 1.0  0.0 - 1.0 mg/dL   Nitrite NEGATIVE  NEGATIVE   Leukocytes, UA TRACE (*) NEGATIVE  URINE MICROSCOPIC-ADD ON     Status: Abnormal   Collection Time    03/31/13  2:46 PM      Result Value Range   Squamous Epithelial / LPF FEW (*) RARE   WBC, UA 0-2  <3 WBC/hpf   Bacteria, UA FEW (*) RARE   Casts HYALINE CASTS (*) NEGATIVE   Urine-Other MUCOUS PRESENT    POCT I-STAT TROPONIN I     Status: None   Collection Time    03/31/13  3:05 PM      Result Value Range   Troponin i, poc 0.00  0.00 - 0.08 ng/mL   Comment 3            Comment: Due to the release kinetics of cTnI,     a negative result within the first hours     of the onset of symptoms does not rule out     myocardial infarction with certainty.     If myocardial infarction is still suspected,  repeat the test at appropriate intervals.   US Abdomen Complete  03/31/2013   CLINICAL DATA:  Abdominal pain  EXAM: ULTRASOUND ABDOMEN COMPLETE  COMPARISON:  Abdominal MRI 11/05/2012  FINDINGS: Gallbladder  Mobile echogenic foci with posterior acoustic shadowing consistent with cholelithiasis. The largest stone measures 1 cm in diameter. There is mild thickening of the hepatic wall of the gallbladder measuring up to 4 mm. In regions, the  wall appears thicker, but this likely represents folding of the gallbladder. Per the sonographer, the sonographic Eulah Pont sign was negative.  Common bile duct  Diameter: Within normal limits at 6 mm.  Liver  Round solid and mildly hyperechoic lesion in the right hepatic lobe measures 5.1 x 4.1 x 4.6 cm. This corresponds with the previously described cavernous hemangioma. Background hepatic parenchyma is within normal limits. Numerous small cystic lesions throughout the left hepatic lobe as seen on prior MRI.  IVC  No abnormality visualized.  Pancreas  Visualized portion unremarkable.  Spleen  Size and appearance within normal limits.  Right Kidney  Length: 12.1 cm. Echogenicity within normal limits. No solid mass or hydronephrosis visualized. Simple renal cysts as seen on prior MRI without significant interval change.  Left Kidney  Length: 12.2 cm. Echogenicity within normal limits. No solid mass or hydronephrosis visualized. Simple renal cysts as seen on prior MRI without significant interval change.  Abdominal aorta  No aneurysm visualized. Bifurcation not well seen secondary to obscuring bowel gas.  IMPRESSION: 1. Cholelithiasis and mild gallbladder wall thickening without a positive sonographic Murphy sign. Findings raise concern for chronic cholecystitis, but do not entirely exclude acute cholecystitis. If clinical concern persists for acute cholecystitis, consider nuclear medicine HIDA scan. 2. 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 3. 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   Electronically Signed   By: Malachy Moan M.D.   On: 03/31/2013 16:44    Review of Systems  All other systems reviewed and are negative.    Blood pressure 117/47, pulse 44, temperature 97.5 F (36.4 C), temperature source Oral, resp. rate 16, SpO2 99.00%. Physical Exam  Constitutional: He is oriented to  person, place, and time. He appears well-developed and well-nourished. No distress.  HENT:  Head: Normocephalic and atraumatic.  Right Ear: External ear normal.  Left Ear: External ear normal.  Nose: Nose normal.  Mouth/Throat: Oropharynx is clear and moist. No oropharyngeal exudate.  Eyes: Conjunctivae are normal. Pupils are equal, round, and reactive to light. No scleral icterus.  Neck: No tracheal deviation present.  Cardiovascular: Normal rate and regular rhythm.   Murmur heard. Respiratory: Effort normal and breath sounds normal. No respiratory distress. He has no wheezes.  GI: Soft. Bowel sounds are normal. He exhibits no distension. There is no tenderness. There is no rebound.  Neurological: He is alert and oriented to person, place, and time.  Skin: Skin is warm and dry. No rash noted. No erythema.  Psychiatric: His behavior is normal. Judgment normal.     Assessment/Plan Symptomatic cholelithiasis  I recommended That he be admitted to the hospital for IV fluids, repeat liver function tests, and is improving in the morning, then proceeding with a laparoscopic cholecystectomy and cholangiogram.  After some discussion, he agreed to be admitted with planned surgery for tomorrow. I will place him on IV antibiotics as there was some gallbladder wall thickening and a left shift on his CBC.  Tritia Endo A 03/31/2013,  5:36 PM

## 2013-03-31 NOTE — ED Notes (Signed)
Pt is here with upper abdominal pain, burping, and pale.  Pt has history of gallstones and open heart surgery.  Unable to get temperature

## 2013-03-31 NOTE — ED Notes (Signed)
Pt reports he don't need an IV nor the fluids.  "I have no pain now."  MD made aware.

## 2013-03-31 NOTE — ED Notes (Signed)
sabrina aware of patients ekg heart rate--44

## 2013-04-01 ENCOUNTER — Encounter (HOSPITAL_COMMUNITY)
Admission: EM | Disposition: A | Discharge status: Home / Self Care | Payer: Self-pay | Source: Home / Self Care | Attending: Emergency Medicine

## 2013-04-01 ENCOUNTER — Observation Stay (HOSPITAL_COMMUNITY): Payer: BC Managed Care – PPO | Admitting: Anesthesiology

## 2013-04-01 ENCOUNTER — Encounter (HOSPITAL_COMMUNITY): Payer: Self-pay | Admitting: Anesthesiology

## 2013-04-01 ENCOUNTER — Encounter (HOSPITAL_COMMUNITY): Payer: BC Managed Care – PPO | Admitting: Anesthesiology

## 2013-04-01 ENCOUNTER — Observation Stay (HOSPITAL_COMMUNITY): Payer: BC Managed Care – PPO

## 2013-04-01 HISTORY — PX: CHOLECYSTECTOMY: SHX55

## 2013-04-01 LAB — COMPREHENSIVE METABOLIC PANEL
ALT: 22 U/L (ref 0–53)
AST: 26 U/L (ref 0–37)
Albumin: 3.5 g/dL (ref 3.5–5.2)
Alkaline Phosphatase: 42 U/L (ref 39–117)
CO2: 25 mEq/L (ref 19–32)
Calcium: 8.7 mg/dL (ref 8.4–10.5)
Creatinine, Ser: 1.22 mg/dL (ref 0.50–1.35)
GFR calc non Af Amer: 59 mL/min — ABNORMAL LOW (ref >90)
Sodium: 140 mEq/L (ref 135–145)
Total Protein: 6.1 g/dL (ref 6.0–8.3)

## 2013-04-01 SURGERY — LAPAROSCOPIC CHOLECYSTECTOMY WITH INTRAOPERATIVE CHOLANGIOGRAM
Anesthesia: General | Site: Abdomen | Wound class: Clean Contaminated

## 2013-04-01 MED ORDER — FENTANYL CITRATE 0.05 MG/ML IJ SOLN
INTRAMUSCULAR | Status: DC | PRN
  Administered 2013-04-01: 100 ug via INTRAVENOUS
  Administered 2013-04-01 (×2): 50 ug via INTRAVENOUS

## 2013-04-01 MED ORDER — PROPOFOL 10 MG/ML IV BOLUS
INTRAVENOUS | Status: DC | PRN
  Administered 2013-04-01: 200 mg via INTRAVENOUS

## 2013-04-01 MED ORDER — NON FORMULARY: Status: DC

## 2013-04-01 MED ORDER — ROCURONIUM BROMIDE 100 MG/10ML IV SOLN
INTRAVENOUS | Status: DC | PRN
  Administered 2013-04-01: 10 mg via INTRAVENOUS
  Administered 2013-04-01: 30 mg via INTRAVENOUS

## 2013-04-01 MED ORDER — SODIUM CHLORIDE 0.9 % IR SOLN
Status: DC | PRN
  Administered 2013-04-01: 1000 mL

## 2013-04-01 MED ORDER — LACTATED RINGERS IV SOLN
INTRAVENOUS | Status: DC | PRN
  Administered 2013-04-01: 11:00:00 via INTRAVENOUS

## 2013-04-01 MED ORDER — 0.9 % SODIUM CHLORIDE (POUR BTL) OPTIME
TOPICAL | Status: DC | PRN
  Administered 2013-04-01: 1000 mL

## 2013-04-01 MED ORDER — ONDANSETRON HCL 4 MG/2ML IJ SOLN
INTRAMUSCULAR | Status: DC | PRN
  Administered 2013-04-01: 4 mg via INTRAVENOUS

## 2013-04-01 MED ORDER — PIPERACILLIN-TAZOBACTAM 3.375 G IVPB
3.3750 g | Freq: Three times a day (TID) | INTRAVENOUS | Status: AC
  Administered 2013-04-01 – 2013-04-02 (×3): 3.375 g via INTRAVENOUS
  Filled 2013-04-01 (×3): qty 50

## 2013-04-01 MED ORDER — BUPIVACAINE-EPINEPHRINE PF 0.25-1:200000 % IJ SOLN
INTRAMUSCULAR | Status: AC
  Filled 2013-04-01: qty 30

## 2013-04-01 MED ORDER — ATORVASTATIN CALCIUM 20 MG PO TABS
20.0000 mg | ORAL_TABLET | ORAL | Status: DC
  Filled 2013-04-01: qty 1

## 2013-04-01 MED ORDER — LIDOCAINE HCL (CARDIAC) 20 MG/ML IV SOLN
INTRAVENOUS | Status: DC | PRN
  Administered 2013-04-01: 80 mg via INTRAVENOUS

## 2013-04-01 MED ORDER — GLYCOPYRROLATE 0.2 MG/ML IJ SOLN
INTRAMUSCULAR | Status: DC | PRN
  Administered 2013-04-01: 0.6 mg via INTRAVENOUS

## 2013-04-01 MED ORDER — LACTATED RINGERS IV SOLN
INTRAVENOUS | Status: DC
  Administered 2013-04-01: 10:00:00 via INTRAVENOUS

## 2013-04-01 MED ORDER — NEOSTIGMINE METHYLSULFATE 1 MG/ML IJ SOLN
INTRAMUSCULAR | Status: DC | PRN
  Administered 2013-04-01: 4 mg via INTRAVENOUS

## 2013-04-01 MED ORDER — HYDROMORPHONE HCL PF 1 MG/ML IJ SOLN: 0.2500 mg | INTRAMUSCULAR | Status: DC | PRN

## 2013-04-01 MED ORDER — ARTIFICIAL TEARS OP OINT
TOPICAL_OINTMENT | OPHTHALMIC | Status: DC | PRN
  Administered 2013-04-01: 1 via OPHTHALMIC

## 2013-04-01 MED ORDER — SODIUM CHLORIDE 0.9 % IV SOLN
INTRAVENOUS | Status: DC | PRN
  Administered 2013-04-01: 11:00:00

## 2013-04-01 MED ORDER — ACETAMINOPHEN 325 MG PO TABS
650.0000 mg | ORAL_TABLET | ORAL | Status: DC | PRN
  Administered 2013-04-01: 650 mg via ORAL
  Filled 2013-04-01: qty 2

## 2013-04-01 MED ORDER — EPHEDRINE SULFATE 50 MG/ML IJ SOLN
INTRAMUSCULAR | Status: DC | PRN
  Administered 2013-04-01: 10 mg via INTRAVENOUS

## 2013-04-01 MED ORDER — ONDANSETRON HCL 4 MG/2ML IJ SOLN: 4.0000 mg | Freq: Once | INTRAMUSCULAR | Status: DC | PRN

## 2013-04-01 MED ORDER — BUPIVACAINE-EPINEPHRINE 0.25% -1:200000 IJ SOLN
INTRAMUSCULAR | Status: DC | PRN
  Administered 2013-04-01: 30 mL

## 2013-04-01 MED ORDER — MIDAZOLAM HCL 5 MG/5ML IJ SOLN
INTRAMUSCULAR | Status: DC | PRN
  Administered 2013-04-01: 1 mg via INTRAVENOUS

## 2013-04-01 SURGICAL SUPPLY — 47 items
ADH SKN CLS APL DERMABOND .7 (GAUZE/BANDAGES/DRESSINGS) ×1
APPLIER CLIP 5 13 M/L LIGAMAX5 (MISCELLANEOUS) ×2
APR CLP MED LRG 5 ANG JAW (MISCELLANEOUS) ×1
BAG SPEC RTRVL 10 TROC 200 (ENDOMECHANICALS) ×1
BLADE SURG ROTATE 9660 (MISCELLANEOUS) ×1 IMPLANT
CANISTER SUCTION 2500CC (MISCELLANEOUS) ×2 IMPLANT
CHLORAPREP W/TINT 26ML (MISCELLANEOUS) ×2 IMPLANT
CLIP APPLIE 5 13 M/L LIGAMAX5 (MISCELLANEOUS) ×1 IMPLANT
COVER MAYO STAND STRL (DRAPES) ×3 IMPLANT
COVER SURGICAL LIGHT HANDLE (MISCELLANEOUS) ×2 IMPLANT
DECANTER SPIKE VIAL GLASS SM (MISCELLANEOUS) ×2 IMPLANT
DERMABOND ADVANCED (GAUZE/BANDAGES/DRESSINGS) ×1
DERMABOND ADVANCED .7 DNX12 (GAUZE/BANDAGES/DRESSINGS) ×1 IMPLANT
DRAPE C-ARM 42X72 X-RAY (DRAPES) ×2 IMPLANT
ELECT REM PT RETURN 9FT ADLT (ELECTROSURGICAL) ×2
ELECTRODE REM PT RTRN 9FT ADLT (ELECTROSURGICAL) ×1 IMPLANT
GLOVE BIO SURGEON STRL SZ7 (GLOVE) ×2 IMPLANT
GLOVE BIO SURGEON STRL SZ7.5 (GLOVE) ×1 IMPLANT
GLOVE BIO SURGEON STRL SZ8 (GLOVE) ×1 IMPLANT
GLOVE BIOGEL PI IND STRL 6.5 (GLOVE) IMPLANT
GLOVE BIOGEL PI IND STRL 7.0 (GLOVE) IMPLANT
GLOVE BIOGEL PI IND STRL 7.5 (GLOVE) ×1 IMPLANT
GLOVE BIOGEL PI IND STRL 8 (GLOVE) IMPLANT
GLOVE BIOGEL PI INDICATOR 6.5 (GLOVE) ×1
GLOVE BIOGEL PI INDICATOR 7.0 (GLOVE) ×1
GLOVE BIOGEL PI INDICATOR 7.5 (GLOVE) ×2
GLOVE BIOGEL PI INDICATOR 8 (GLOVE) ×1
GLOVE SURG SS PI 7.0 STRL IVOR (GLOVE) ×1 IMPLANT
GOWN STRL NON-REIN LRG LVL3 (GOWN DISPOSABLE) ×7 IMPLANT
GOWN STRL REIN XL XLG (GOWN DISPOSABLE) ×1 IMPLANT
KIT BASIN OR (CUSTOM PROCEDURE TRAY) ×2 IMPLANT
KIT ROOM TURNOVER OR (KITS) ×2 IMPLANT
NS IRRIG 1000ML POUR BTL (IV SOLUTION) ×2 IMPLANT
PAD ARMBOARD 7.5X6 YLW CONV (MISCELLANEOUS) ×2 IMPLANT
POUCH RETRIEVAL ECOSAC 10 (ENDOMECHANICALS) ×1 IMPLANT
POUCH RETRIEVAL ECOSAC 10MM (ENDOMECHANICALS) ×1
SCISSORS LAP 5X35 DISP (ENDOMECHANICALS) ×1 IMPLANT
SET CHOLANGIOGRAPH 5 50 .035 (SET/KITS/TRAYS/PACK) ×2 IMPLANT
SET IRRIG TUBING LAPAROSCOPIC (IRRIGATION / IRRIGATOR) ×2 IMPLANT
SLEEVE ENDOPATH XCEL 5M (ENDOMECHANICALS) ×5 IMPLANT
SPECIMEN JAR SMALL (MISCELLANEOUS) ×2 IMPLANT
SUT MNCRL AB 4-0 PS2 18 (SUTURE) ×2 IMPLANT
TOWEL OR 17X24 6PK STRL BLUE (TOWEL DISPOSABLE) ×2 IMPLANT
TOWEL OR 17X26 10 PK STRL BLUE (TOWEL DISPOSABLE) ×2 IMPLANT
TRAY LAPAROSCOPIC (CUSTOM PROCEDURE TRAY) ×2 IMPLANT
TROCAR XCEL BLUNT TIP 100MML (ENDOMECHANICALS) ×2 IMPLANT
TROCAR XCEL NON-BLD 5MMX100MML (ENDOMECHANICALS) ×2 IMPLANT

## 2013-04-01 NOTE — Progress Notes (Signed)
Subjective: No pain, doing well this am  Objective: Vital signs in last 24 hours: Temp:  [97.5 F (36.4 C)-98.4 F (36.9 C)] 97.9 F (36.6 C) (11/18 0539) Pulse Rate:  [44-64] 50 (11/18 0539) Resp:  [16-20] 18 (11/18 0539) BP: (108-141)/(47-60) 127/50 mmHg (11/18 0539) SpO2:  [96 %-99 %] 96 % (11/18 0539) Last BM Date: 03/31/13  Intake/Output from previous day: 11/17 0701 - 11/18 0700 In: 870 [I.V.:820; IV Piggyback:50] Out: -  Intake/Output this shift:    GI: soft nontender no murphys sign  Lab Results:   Recent Labs  03/31/13 1419  WBC 7.7  HGB 14.4  HCT 42.7  PLT 132*   BMET  Recent Labs  03/31/13 1419 04/01/13 0505  NA 140 140  K 4.3 3.5  CL 103 106  CO2 27 25  GLUCOSE 172* 87  BUN 21 20  CREATININE 1.13 1.22  CALCIUM 9.5 8.7     Studies/Results: US Abdomen Complete  03/31/2013   CLINICAL DATA:  Abdominal pain  EXAM: ULTRASOUND ABDOMEN COMPLETE  COMPARISON:  Abdominal MRI 11/05/2012  FINDINGS: Gallbladder  Mobile echogenic foci with posterior acoustic shadowing consistent with cholelithiasis. The largest stone measures 1 cm in diameter. There is mild thickening of the hepatic wall of the gallbladder measuring up to 4 mm. In regions, the wall appears thicker, but this likely represents folding of the gallbladder. Per the sonographer, the sonographic Eulah Pont sign was negative.  Common bile duct  Diameter: Within normal limits at 6 mm.  Liver  Round solid and mildly hyperechoic lesion in the right hepatic lobe measures 5.1 x 4.1 x 4.6 cm. This corresponds with the previously described cavernous hemangioma. Background hepatic parenchyma is within normal limits. Numerous small cystic lesions throughout the left hepatic lobe as seen on prior MRI.  IVC  No abnormality visualized.  Pancreas  Visualized portion unremarkable.  Spleen  Size and appearance within normal limits.  Right Kidney  Length: 12.1 cm. Echogenicity within normal limits. No solid mass or  hydronephrosis visualized. Simple renal cysts as seen on prior MRI without significant interval change.  Left Kidney  Length: 12.2 cm. Echogenicity within normal limits. No solid mass or hydronephrosis visualized. Simple renal cysts as seen on prior MRI without significant interval change.  Abdominal aorta  No aneurysm visualized. Bifurcation not well seen secondary to obscuring bowel gas.  IMPRESSION: 1. Cholelithiasis and mild gallbladder wall thickening without a positive sonographic Murphy sign. Findings raise concern for chronic cholecystitis, but do not entirely exclude acute cholecystitis. If clinical concern persists for acute cholecystitis, consider nuclear medicine HIDA scan. 2. 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 3. 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   Electronically Signed   By: Malachy Moan M.D.   On: 03/31/2013 16:44    Anti-infectives: Anti-infectives   Start     Dose/Rate Route Frequency Ordered Stop   03/31/13 2300  piperacillin-tazobactam (ZOSYN) IVPB 3.375 g     3.375 g 12.5 mL/hr over 240 Minutes Intravenous 3 times per day 03/31/13 1747     03/31/13 1800  piperacillin-tazobactam (ZOSYN) IVPB 3.375 g     3.375 g 100 mL/hr over 30 Minutes Intravenous STAT 03/31/13 1746 03/31/13 1842      Assessment/Plan: Symptomatic cholelithiasis plan for lap chole with ioc I discussed the procedure in detail..  We discussed the risks and benefits of a laparoscopic cholecystectomy and possible cholangiogram including,  but not limited to bleeding, infection, injury to surrounding structures such as the intestine or liver, bile leak, retained gallstones, need to convert to an open procedure, prolonged diarrhea, blood clots such as  DVT, common bile duct injury, anesthesia risks, and possible need for additional procedures.  The likelihood of improvement in symptoms  and return to the patient's normal status is good. We discussed the typical post-operative recovery course.   [facility] 04/01/2013

## 2013-04-01 NOTE — Anesthesia Postprocedure Evaluation (Signed)
  Anesthesia Post-op Note  Patient: Tyler Fox  Procedure(s) Performed: Procedure(s): LAPAROSCOPIC CHOLECYSTECTOMY WITH INTRAOPERATIVE CHOLANGIOGRAM (N/A)  Patient Location: PACU  Anesthesia Type:General  Level of Consciousness: awake, alert , oriented and patient cooperative  Airway and Oxygen Therapy: Patient Spontanous Breathing  Post-op Pain: mild  Post-op Assessment: Post-op Vital signs reviewed, Patient's Cardiovascular Status Stable, Respiratory Function Stable, Patent Airway, No signs of Nausea or vomiting and Pain level controlled  Post-op Vital Signs: stable  Complications: No apparent anesthesia complications

## 2013-04-01 NOTE — Progress Notes (Signed)
UR completed 

## 2013-04-01 NOTE — Op Note (Signed)
Preoperative diagnosis: Acute cholecystitis Postoperative diagnosis: Same as above Procedure: Laparoscopic cholecystectomy with cholangiogram Surgeon: Dr. Harden Mo Asst.: Dr. Violeta Gelinas Anesthesia: Gen. 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 He has stones on his ultrasound. Clinically appears to have cholecystitis. He has a bilirubin that is mildly elevated. We discussed going to the operating room for a cholecystectomy plus a cholangiogram. The risks and benefits of this were discussed prior to beginning.  Procedure: After informed consent was obtained the patient was taken to the operating room. He was on Zosyn on the floor aleady. Sequential compression devices were on his legs. He was then placed under general anesthesia without complication. His abdomen was prepped and draped in the standard sterile surgical fashion. The surgical timeout was then performed.  I infiltrated Marcaine below his umbilicus. I then made a vertical incision and carried this to his fascia. This was incised sharply. The peritoneum was entered bluntly. I then placed a 0 Vicryl pursestring suture to the fascia. A Hassan trocar was introduced and the abdomen was then insufflated to 15 mmHg pressure. I then inserted 3 further 5 mm trocars in the epigastrium and right upper quadrant under direct vision without complication. I then retracted the gallbladder cephalad. He was noted to have acute cholecystitis and had a fair amount of pericholecystic fluid. His omentum was adherent to the gallbladder this was taken down bluntly. The gallbladder was then retracted lateral. I was able to obtain the critical view of safety.  I didn't do a cholangiogram as his bilirubin was mildly elevated. I clipped the duct distally. I made an incision in the duct and introduced a catheter. I secured this into place. A cholangiogram was performed that showed I was in the cystic duct, filling of both sides of the liver, filling of the duodenum, and no evidence of any stones. The catheter was then removed. I then clipped the duct and divided it. I clipped both the anterior and posterior branch of the artery and divided these as well. The gallbladder was then removed from the liver bed without any difficulty. There was no spillage of bile. This was then placed in a bag and removed from the umbilicus. Hemostasis was observed. I irrigated until this was clear. I then removed the [facility] trocar. I tied my umbilical stitch down and this completely obliterated the defect. The abdomen was then desufflated and all trocars were removed. These were closed with 4-0 Monocryl and Dermabond. He tolerated this well was extubated and transferred to recovery stable.

## 2013-04-01 NOTE — Anesthesia Preprocedure Evaluation (Signed)
Anesthesia Evaluation  Patient identified by MRN, date of birth, ID band Patient awake    Reviewed: Allergy & Precautions, H&P , NPO status , Patient's Chart, lab work & pertinent test results  Airway       Dental   Pulmonary former smoker,          Cardiovascular hypertension, + CAD, + Past MI and + CABG     Neuro/Psych    GI/Hepatic   Endo/Other    Renal/GU      Musculoskeletal   Abdominal   Peds  Hematology  (+) anemia ,   Anesthesia Other Findings   Reproductive/Obstetrics                           Anesthesia Physical Anesthesia Plan  ASA: III  Anesthesia Plan: General   Post-op Pain Management:    Induction: Intravenous  Airway Management Planned: Oral ETT  Additional Equipment:   Intra-op Plan:   Post-operative Plan: Extubation in OR  Informed Consent: I have reviewed the patients History and Physical, chart, labs and discussed the procedure including the risks, benefits and alternatives for the proposed anesthesia with the patient or authorized representative who has indicated his/her understanding and acceptance.     Plan Discussed with: CRNA, Surgeon and Anesthesiologist  Anesthesia Plan Comments:         Anesthesia Quick Evaluation

## 2013-04-01 NOTE — Anesthesia Procedure Notes (Signed)
Procedure Name: Intubation Date/Time: 04/01/2013 11:03 AM Performed by: Gayla Medicus Pre-anesthesia Checklist: Patient identified, Timeout performed, Emergency Drugs available, Suction available and Patient being monitored Patient Re-evaluated:Patient Re-evaluated prior to inductionOxygen Delivery Method: Circle system utilized Preoxygenation: Pre-oxygenation with 100% oxygen Intubation Type: IV induction Ventilation: Mask ventilation without difficulty Laryngoscope Size: Mac and 3 Grade View: Grade I Tube type: Oral Tube size: 7.5 mm Number of attempts: 1 Airway Equipment and Method: Stylet Placement Confirmation: ETT inserted through vocal cords under direct vision,  positive ETCO2 and breath sounds checked- equal and bilateral Secured at: 23 cm Tube secured with: Tape Dental Injury: Teeth and Oropharynx as per pre-operative assessment

## 2013-04-01 NOTE — Progress Notes (Signed)
Report given to lauren rn as caregiver 

## 2013-04-01 NOTE — Preoperative (Signed)
Beta Blockers   Reason not to administer Beta Blockers:Not Applicable 

## 2013-04-01 NOTE — Transfer of Care (Signed)
Immediate Anesthesia Transfer of Care Note  Patient: Tyler Fox  Procedure(s) Performed: Procedure(s): LAPAROSCOPIC CHOLECYSTECTOMY WITH INTRAOPERATIVE CHOLANGIOGRAM (N/A)  Patient Location: PACU  Anesthesia Type:General  Level of Consciousness: awake, alert  and oriented  Airway & Oxygen Therapy: Patient Spontanous Breathing and Patient connected to nasal cannula oxygen  Post-op Assessment: Report given to PACU RN, Post -op Vital signs reviewed and stable and Patient moving all extremities X 4  Post vital signs: Reviewed and stable  Complications: No apparent anesthesia complications

## 2013-04-02 MED ORDER — HYDROCODONE-ACETAMINOPHEN 10-325 MG PO TABS: 1.0000 | ORAL_TABLET | Freq: Four times a day (QID) | ORAL | Status: AC | PRN

## 2013-04-02 NOTE — Progress Notes (Signed)
Discharge instructions reviewed with pt and pt's wife and prescriptions given.  Pt and pt's wife verbalized understanding and had no questions.  Pt discharged in stable condition via wheelchair with wife.  [name]   

## 2013-04-02 NOTE — Discharge Summary (Signed)
Physician Discharge Summary  Patient ID: Tyler Fox MRN: [id number] DOB/AGE: [birth year] 67 y.o.  Admit date: 03/31/2013 Discharge date: 04/02/2013  Admission Diagnoses: Cholecystitis htn  Discharge Diagnoses:  Active Problems:   * No active hospital problems. *   Discharged Condition: good  Hospital Course: 71 yom admitted and underwent uneventful lap chole with normal cholangiogram for acute cholecystitis. He is doing well and will be discharged home today  Consults: None  Significant Diagnostic Studies: [place] with cholelithiasis  Treatments: surgery: lap chole with ioc    Disposition: 01-Home or Self Care     Medication List         aspirin 81 MG EC tablet  Take 1 tablet (81 mg total) by mouth daily.     Co-Enzyme Q-10 100 MG Caps  Take 100 mg by mouth daily.     FISH OIL PO  Take 1 tablet by mouth daily.     HYDROcodone-acetaminophen 10-325 MG per tablet  Commonly known as:  NORCO  Take 1 tablet by mouth every 6 (six) hours as needed.     losartan 25 MG tablet  Commonly known as:  COZAAR  Take 25 mg by mouth daily.     metoprolol tartrate 25 MG tablet  Commonly known as:  LOPRESSOR  Take 1 tablet (25 mg total) by mouth 2 (two) times daily.     multivitamin with minerals Tabs tablet  Take 1 tablet by mouth daily.     pantoprazole 40 MG tablet  Commonly known as:  PROTONIX  Take 40 mg by mouth 2 (two) times daily.     rosuvastatin 20 MG tablet  Commonly known as:  CRESTOR  Take 10 mg by mouth every other day.           Follow-up Information   Follow up with [facility], MD In 3 weeks.   Specialty:  General Surgery   Contact information:   [address] [place] [address] [phone]       Signed: Emelia Loron 04/02/2013, 9:27 AM

## 2013-04-08 ENCOUNTER — Encounter (HOSPITAL_COMMUNITY): Payer: Self-pay | Admitting: General Surgery

## 2013-04-25 ENCOUNTER — Ambulatory Visit (INDEPENDENT_AMBULATORY_CARE_PROVIDER_SITE_OTHER): Payer: BC Managed Care – PPO | Admitting: General Surgery

## 2013-04-25 ENCOUNTER — Encounter (INDEPENDENT_AMBULATORY_CARE_PROVIDER_SITE_OTHER): Payer: Self-pay | Admitting: General Surgery

## 2013-04-25 VITALS — BP 136/80 | HR 60 | Temp 98.0°F | Resp 18 | Ht 71.0 in | Wt 183.0 lb

## 2013-04-25 DIAGNOSIS — Z09 Encounter for follow-up examination after completed treatment for conditions other than malignant neoplasm: Secondary | ICD-10-CM

## 2013-04-25 NOTE — Progress Notes (Signed)
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   MRN: [id number]  HPI This is a 67 year old male who was admitted to the hospital with cholecystitis. I did a laparoscopic cholecystectomy on him. He was discharged home. He is doing well and is without any complaints today. He has no nausea or vomiting. He is tolerating his regular diet. He has no diarrhea. He is back to work. He is returned most of his normal activities at this point also. We discussed his pathology which showed chronic cholecystitis.  Review of Systems     Objective:   Physical Exam Well-healed incisions without infection    Assessment:     Status post laparoscopic cholecystectomy     Plan:     He is doing very well. I told him he was released to all of his normal activities. He can come back and see me as needed.

## 2013-05-05 ENCOUNTER — Telehealth: Payer: Self-pay | Admitting: Cardiovascular Disease

## 2013-05-05 LAB — LIPID PANEL
LDL Cholesterol: 105 mg/dL — ABNORMAL HIGH (ref 0–99)
Total CHOL/HDL Ratio: 3.3 Ratio
VLDL: 19 mg/dL (ref 0–40)

## 2013-05-05 LAB — HEPATIC FUNCTION PANEL
AST: 27 U/L (ref 0–37)
Albumin: 4.4 g/dL (ref 3.5–5.2)
Alkaline Phosphatase: 51 U/L (ref 39–117)
Bilirubin, Direct: 0.3 mg/dL (ref 0.0–0.3)
Indirect Bilirubin: 1 mg/dL — ABNORMAL HIGH (ref 0.0–0.9)
Total Bilirubin: 1.3 mg/dL — ABNORMAL HIGH (ref 0.3–1.2)

## 2013-05-05 LAB — CK: Total CK: 138 U/L (ref 7–232)

## 2013-05-05 NOTE — Telephone Encounter (Signed)
Pt called and verified x 2.  Stated he is in the [place] lab in our building and needs lab order for lipid panel.  Reviewed chart and lab orders already placed on 10.8.14.  Pt informed of this and informed lab staff who located orders from 10.8.14.  Confirmed those are the labs Dr. Allyson Sabal ordered at [facility].  Pt verbalized understanding.

## 2013-05-12 ENCOUNTER — Telehealth: Payer: Self-pay | Admitting: *Deleted

## 2013-05-12 ENCOUNTER — Encounter: Payer: Self-pay | Admitting: *Deleted

## 2013-05-12 NOTE — Telephone Encounter (Signed)
Message copied by Marella Bile on Mon May 12, 2013  7:57 PM ------      Message from: Runell Gess      Created: Thu May 08, 2013  6:16 AM       Big improvement in FLP. Bili mildly elevated. I'm not concerned. Repeat in 6 months ------

## 2013-05-19 ENCOUNTER — Encounter: Payer: Self-pay | Admitting: *Deleted

## 2013-08-20 ENCOUNTER — Encounter: Payer: Self-pay | Admitting: Cardiovascular Disease

## 2013-08-20 ENCOUNTER — Ambulatory Visit (INDEPENDENT_AMBULATORY_CARE_PROVIDER_SITE_OTHER): Payer: BC Managed Care – PPO | Admitting: Cardiovascular Disease

## 2013-08-20 VITALS — BP 112/64 | HR 48 | Ht 71.75 in | Wt 189.2 lb

## 2013-08-20 DIAGNOSIS — Z79899 Other long term (current) drug therapy: Secondary | ICD-10-CM

## 2013-08-20 DIAGNOSIS — Z951 Presence of aortocoronary bypass graft: Secondary | ICD-10-CM

## 2013-08-20 DIAGNOSIS — I10 Essential (primary) hypertension: Secondary | ICD-10-CM

## 2013-08-20 DIAGNOSIS — R001 Bradycardia, unspecified: Secondary | ICD-10-CM

## 2013-08-20 DIAGNOSIS — E785 Hyperlipidemia, unspecified: Secondary | ICD-10-CM

## 2013-08-20 DIAGNOSIS — I2510 Atherosclerotic heart disease of native coronary artery without angina pectoris: Secondary | ICD-10-CM

## 2013-08-20 DIAGNOSIS — I498 Other specified cardiac arrhythmias: Secondary | ICD-10-CM

## 2013-08-20 NOTE — Assessment & Plan Note (Signed)
I'm going to reduce his beta blocker dose from 25 mg of Lopressor twice a day to 12.5 mg by mouth twice a day

## 2013-08-20 NOTE — Assessment & Plan Note (Signed)
Status post non-ST segment elevation myocardial infarction 04/16/12 resulting in coronary artery bypass grafting by Dr. Kathlee NationsPeter Van tripe with a LIMA to his LAD, vein to the diagonal branch and a vein to obtuse marginal branch and distal circumflex coronary artery as well as to the PDA. Postop course was uncomplicated. He denies chest pain or shortness of breath.

## 2013-08-20 NOTE — Assessment & Plan Note (Signed)
Somewhat statin intolerant. He is on Crestor 10 mg every other day with recent lipid profile performed 05/05/13 revealing a total cholesterol 177, LDL of 105 HDL of 53

## 2013-08-20 NOTE — Patient Instructions (Addendum)
  We will see you back in follow up in January 2016 with Dr Allyson SabalBerry.  Dr Allyson SabalBerry has ordered blood work to be done, fasting.

## 2013-08-20 NOTE — Assessment & Plan Note (Signed)
Well-controlled on current medications 

## 2013-08-20 NOTE — Progress Notes (Signed)
08/20/2013 Tyler Seta, MD   1945-11-08  [id number]  Primary Physician Eartha Inch, MD Primary Cardiologist: Tyler Gess MD Tyler Fox   HPI:  The patient is a 68 year old, thin and fit-appearing, married [place] American male, father of 2, grandfather to 1 grandchild who I last saw approximately 3 months ago. He presented to [facility] April 16, 2012, with a non-ST-segment-elevation myocardial infarction. He was catheterized by Dr. Bryan Lemma radially on April 17, 2012, revealing 2-vessel disease. His EF was normal with mild interior hypokinesia. He underwent coronary artery bypass grafting x5 by Dr. Kathlee Nations Trigt April 19, 2012, with a LIMA to his LAD, a vein to his diagonal branch, a vein to an obtuse marginal branch and distal circumflex. There was also a vein to a PDA. His postop course was uncomplicated. He was seen at [facility] May 07, 2012, with delirium and was found to have a UTI. Cultures revealed Klebsiella pneumoniae. He was treated with Keflex and subsequently improved.I saw him in the office 02/19/13. He has done well since. He is somewhat statin intolerant to his most recent lipid profile performed 05/05/13 revealed a total social 177, LDL 105 an HDL of 53 on Crestor 10 mg every other day. He did have a laparoscopic cholecystectomy performed by Dr. Norlene Campbell back in October last year.   Current Outpatient Prescriptions  Medication Sig Dispense Refill  . aspirin EC 81 MG EC tablet Take 1 tablet (81 mg total) by mouth daily.      Marland Kitchen Co-Enzyme Q-10 100 MG CAPS Take 100 mg by mouth daily.       Marland Kitchen losartan (COZAAR) 25 MG tablet Take 25 mg by mouth daily.      . metoprolol tartrate (LOPRESSOR) 25 MG tablet Take 1 tablet (25 mg total) by mouth 2 (two) times daily.  60 tablet  7  . Multiple Vitamin (MULTIVITAMIN WITH MINERALS) TABS Take 1 tablet by mouth daily.      . Omega-3 Fatty Acids (FISH OIL PO) Take 1 tablet by mouth daily.        . pantoprazole (PROTONIX) 40 MG tablet Take 40 mg by mouth 2 (two) times daily.      . rosuvastatin (CRESTOR) 20 MG tablet Take 10 mg by mouth every other day.      Marland Kitchen HYDROcodone-acetaminophen (NORCO) 10-325 MG per tablet Take 1 tablet by mouth every 6 (six) hours as needed.  20 tablet  0   No current facility-administered medications for this visit.    Allergies  Allergen Reactions  . Ace Inhibitors Other (See Comments)    cough  . Lisinopril Cough  . Oxycodone     Hallucinations    History   Social History  . Marital Status: Married    Spouse Name: N/A    Number of Children: N/A  . Years of Education: N/A   Occupational History  . Not on file.   Social History Main Topics  . Smoking status: Former Smoker -- 0.25 packs/day for 1 years    Types: Cigarettes    Quit date: 04/20/1967  . Smokeless tobacco: Never Used  . Alcohol Use: No  . Drug Use: No  . Sexual Activity: Not on file   Other Topics Concern  . Not on file   Social History Narrative  . No narrative on file     Review of Systems: General: negative for chills, fever, night sweats or weight changes.  Cardiovascular: negative for chest pain,  dyspnea on exertion, edema, orthopnea, palpitations, paroxysmal nocturnal dyspnea or shortness of breath Dermatological: negative for rash Respiratory: negative for cough or wheezing Urologic: negative for hematuria Abdominal: negative for nausea, vomiting, diarrhea, bright red blood per rectum, melena, or hematemesis Neurologic: negative for visual changes, syncope, or dizziness All other systems reviewed and are otherwise negative except as noted above.    Blood pressure 112/64, pulse 48, height 5' 11.75" (1.822 m), weight 189 lb 3.2 oz (85.821 kg).  General appearance: alert and no distress Neck: no adenopathy, no carotid bruit, no JVD, supple, symmetrical, trachea midline and thyroid not enlarged, symmetric, no tenderness/mass/nodules Lungs: clear to  auscultation bilaterally Heart: regular rate and rhythm, S1, S2 normal, no murmur, click, rub or gallop Extremities: extremities normal, atraumatic, no cyanosis or edema  EKG sinus bradycardia of 48 without ST or T wave changes  ASSESSMENT AND PLAN:   Hyperlipidemia Somewhat statin intolerant. 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 Kathlee NationsPeter Van Fox with a LIMA to his LAD, vein to the diagonal branch and a vein to obtuse marginal branch and distal circumflex coronary artery as well as to the PDA. Postop course was uncomplicated. He denies chest pain or shortness of breath.  Essential hypertension Well-controlled on current medications  Bradycardia, asymptomatic I'm going to reduce his beta blocker dose from 25 mg of Lopressor twice a day to 12.5 mg by mouth twice a day      Tyler GessJonathan J. Tyrece Vanterpool MD [facility] LPFACP,FACC,FAHA, [facility] 08/20/2013 6:05 PM

## 2013-09-10 LAB — HEPATIC FUNCTION PANEL
ALT: 19 U/L (ref 0–53)
AST: 26 U/L (ref 0–37)
Albumin: 4.3 g/dL (ref 3.5–5.2)
Alkaline Phosphatase: 47 U/L (ref 39–117)
BILIRUBIN DIRECT: 0.3 mg/dL (ref 0.0–0.3)
Indirect Bilirubin: 1.3 mg/dL — ABNORMAL HIGH (ref 0.2–1.2)
Total Bilirubin: 1.6 mg/dL — ABNORMAL HIGH (ref 0.2–1.2)
Total Protein: 6.5 g/dL (ref 6.0–8.3)

## 2013-09-10 LAB — LIPID PANEL
Cholesterol: 170 mg/dL (ref 0–200)
HDL: 56 mg/dL (ref >39)
LDL CALC: 93 mg/dL (ref 0–99)
Total CHOL/HDL Ratio: 3 Ratio
Triglycerides: 105 mg/dL (ref <150)
VLDL: 21 mg/dL (ref 0–40)

## 2013-09-15 ENCOUNTER — Telehealth: Payer: Self-pay | Admitting: *Deleted

## 2013-09-15 NOTE — Telephone Encounter (Signed)
Opened in error

## 2013-10-07 ENCOUNTER — Other Ambulatory Visit: Payer: Self-pay | Admitting: *Deleted

## 2013-10-07 MED ORDER — ROSUVASTATIN CALCIUM 20 MG PO TABS: 10.0000 mg | ORAL_TABLET | ORAL | Status: DC

## 2013-10-07 NOTE — Telephone Encounter (Signed)
Rx refill sent to patient pharmacy   

## 2013-10-27 ENCOUNTER — Other Ambulatory Visit: Payer: Self-pay | Admitting: *Deleted

## 2013-10-27 MED ORDER — METOPROLOL TARTRATE 25 MG PO TABS: 25.0000 mg | ORAL_TABLET | Freq: Two times a day (BID) | ORAL | Status: DC

## 2013-10-30 ENCOUNTER — Other Ambulatory Visit: Payer: Self-pay | Admitting: *Deleted

## 2013-10-30 MED ORDER — LOSARTAN POTASSIUM 25 MG PO TABS: 25.0000 mg | ORAL_TABLET | Freq: Every day | ORAL | Status: DC

## 2014-02-27 ENCOUNTER — Other Ambulatory Visit: Payer: Self-pay

## 2014-04-16 ENCOUNTER — Ambulatory Visit
Admission: RE | Admit: 2014-04-16 | Discharge: 2014-04-16 | Disposition: A | Discharge status: Home / Self Care | Payer: BC Managed Care – PPO | Source: Ambulatory Visit | Attending: Gastroenterology | Admitting: Gastroenterology

## 2014-04-16 ENCOUNTER — Other Ambulatory Visit: Payer: Self-pay | Admitting: Gastroenterology

## 2014-04-16 DIAGNOSIS — R490 Dysphonia: Secondary | ICD-10-CM

## 2014-04-16 DIAGNOSIS — J986 Disorders of diaphragm: Secondary | ICD-10-CM

## 2014-04-23 ENCOUNTER — Encounter (HOSPITAL_COMMUNITY): Payer: Self-pay | Admitting: Cardiology

## 2014-05-19 ENCOUNTER — Telehealth: Payer: Self-pay | Admitting: Cardiovascular Disease

## 2014-05-19 ENCOUNTER — Telehealth: Payer: Self-pay | Admitting: *Deleted

## 2014-05-19 DIAGNOSIS — Z79899 Other long term (current) drug therapy: Secondary | ICD-10-CM

## 2014-05-19 DIAGNOSIS — E785 Hyperlipidemia, unspecified: Secondary | ICD-10-CM

## 2014-05-19 NOTE — Telephone Encounter (Signed)
Pt needed lab work. Order was needed and has been entered. Pt stated they will have done Wednesday morning, the day before appointment with MD.

## 2014-05-19 NOTE — Telephone Encounter (Signed)
Patient Dr. [name] called to verify that his lab orders had been put in prior to appt w/ Dr. Allyson SabalBerry. I verified that hepatic fx & lipid panel had been ordered. No further questions from patient, he voiced understanding & will get labwork completed tomorrow a.m.

## 2014-05-20 LAB — LIPID PANEL
Cholesterol: 176 mg/dL (ref 0–200)
HDL: 64 mg/dL (ref >39)
LDL Cholesterol: 92 mg/dL (ref 0–99)
TRIGLYCERIDES: 102 mg/dL (ref <150)
Total CHOL/HDL Ratio: 2.8 Ratio
VLDL: 20 mg/dL (ref 0–40)

## 2014-05-20 LAB — HEPATIC FUNCTION PANEL
ALT: 23 U/L (ref 0–53)
AST: 27 U/L (ref 0–37)
Albumin: 4.4 g/dL (ref 3.5–5.2)
Alkaline Phosphatase: 46 U/L (ref 39–117)
BILIRUBIN INDIRECT: 1.3 mg/dL — AB (ref 0.2–1.2)
Bilirubin, Direct: 0.3 mg/dL (ref 0.0–0.3)
Total Bilirubin: 1.6 mg/dL — ABNORMAL HIGH (ref 0.2–1.2)
Total Protein: 6.8 g/dL (ref 6.0–8.3)

## 2014-05-21 ENCOUNTER — Ambulatory Visit (INDEPENDENT_AMBULATORY_CARE_PROVIDER_SITE_OTHER): Payer: BC Managed Care – PPO | Admitting: Cardiovascular Disease

## 2014-05-21 ENCOUNTER — Encounter: Payer: Self-pay | Admitting: Cardiovascular Disease

## 2014-05-21 VITALS — BP 122/60 | HR 51 | Ht 71.0 in | Wt 184.0 lb

## 2014-05-21 DIAGNOSIS — I2510 Atherosclerotic heart disease of native coronary artery without angina pectoris: Secondary | ICD-10-CM

## 2014-05-21 DIAGNOSIS — I10 Essential (primary) hypertension: Secondary | ICD-10-CM

## 2014-05-21 DIAGNOSIS — Z951 Presence of aortocoronary bypass graft: Secondary | ICD-10-CM

## 2014-05-21 DIAGNOSIS — E785 Hyperlipidemia, unspecified: Secondary | ICD-10-CM

## 2014-05-21 NOTE — Progress Notes (Signed)
05/21/2014 [name], MD   06-22-1945  [id number]  Primary Physician Eartha Inch, MD Primary Cardiologist: Runell Gess MD [name]   HPI:  Tyler Fox  is a 70 year old, thin and fit-appearing, married [place] American male, father of 2, grandfather to 1 grandchild who I last saw approximately 8 months ago. He presented to [facility] April 16, 2012, with a non-ST-segment-elevation myocardial infarction. He was catheterized by Dr. Bryan Lemma radially on April 17, 2012, revealing 2-vessel disease. His EF was normal with mild interior hypokinesia. He underwent coronary artery bypass grafting x5 by Dr. Kathlee Nations Trigt April 19, 2012, with a LIMA to his LAD, a vein to his diagonal branch, a vein to an obtuse marginal branch and distal circumflex. There was also a vein to a PDA. His postop course was uncomplicated. He was seen at [facility] May 07, 2012, with delirium and was found to have a UTI. Cultures revealed Klebsiella pneumoniae. He was treated with Keflex and subsequently improved.I saw him in the office 02/19/13. He has done well since. 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 Since I saw him a month ago he remains asymptomatic.  Current Outpatient Prescriptions  Medication Sig Dispense Refill  . aspirin EC 81 MG EC tablet Take 1 tablet (81 mg total) by mouth daily.    Marland Kitchen Co-Enzyme Q-10 100 MG CAPS Take 100 mg by mouth daily.     Marland Kitchen losartan (COZAAR) 25 MG tablet Take 1 tablet (25 mg total) by mouth daily. 30 tablet 9  . metoprolol tartrate (LOPRESSOR) 25 MG tablet Take 1 tablet (25 mg total) by mouth 2 (two) times daily. 60 tablet 5  . Multiple Vitamin (MULTIVITAMIN WITH MINERALS) TABS Take 1 tablet by mouth daily.    .  Omega-3 Fatty Acids (FISH OIL PO) Take 1 tablet by mouth daily.     . pantoprazole (PROTONIX) 40 MG tablet Take 40 mg by mouth as needed.     . rosuvastatin (CRESTOR) 20 MG tablet Take 0.5 tablets (10 mg total) by mouth every other day. 15 tablet 7   No current facility-administered medications for this visit.    Allergies  Allergen Reactions  . Ace Inhibitors Other (See Comments)    cough  . Lisinopril Cough  . Oxycodone     Hallucinations    History   Social History  . Marital Status: Married    Spouse Name: N/A    Number of Children: N/A  . Years of Education: N/A   Occupational History  . Not on file.   Social History Main Topics  . Smoking status: Former Smoker -- 0.25 packs/day for 1 years    Types: Cigarettes    Quit date: 04/20/1967  . Smokeless tobacco: Never Used  . Alcohol Use: No  . Drug Use: No  . Sexual Activity: Not on file   Other Topics Concern  . Not on file   Social History Narrative     Review of Systems: General: negative for chills, fever, night sweats or weight changes.  Cardiovascular: negative for chest pain, dyspnea on exertion, edema, orthopnea, palpitations, paroxysmal nocturnal dyspnea or shortness of breath Dermatological: negative for rash Respiratory: negative for cough or wheezing Urologic: negative for hematuria Abdominal: negative for nausea, vomiting, diarrhea,  bright red blood per rectum, melena, or hematemesis Neurologic: negative for visual changes, syncope, or dizziness All other systems reviewed and are otherwise negative except as noted above.    Blood pressure 122/60, pulse 51, height 5\' 11"  (1.803 m), weight 184 lb (83.462 kg).  General appearance: alert and no distress Neck: no adenopathy, no carotid bruit, no JVD, supple, symmetrical, trachea midline and thyroid not enlarged, symmetric, no tenderness/mass/nodules Lungs: clear to auscultation bilaterally Heart: regular rate and rhythm, S1, S2 normal, no murmur,  click, rub or gallop Extremities: extremities normal, atraumatic, no cyanosis or edema  EKG sinus bradycardia 51 without ST or T-wave changes. 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 Kathlee NationsPeter Van Trigt. . He had a LIMA to his LAD, vein to diagonal branch, obtuse marginal branch and distal circumflex as well as to the PDA. This was in the setting of a non-STEMI. 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  Hyperlipidemia History of hyperlipidemia on Crestor 10 mg every other day. His most recent lipid profile performed 05/19/14 revealed a total cholesterol 176, LDL 92 HDL of 64  Essential hypertension History of hypertension with blood pressure measured today 122/60. He is on metoprolol and losartan. Continue current meds at current dosing      Runell GessJonathan J. Britiney Blahnik MD [facility] CenterFACP,FACC,FAHA, [facility] 05/21/2014 5:07 PM

## 2014-05-21 NOTE — Patient Instructions (Signed)
Your physician wants you to follow-up in: 1 year with Dr Berry. You will receive a reminder letter in the mail two months in advance. If you don't receive a letter, please call our office to schedule the follow-up appointment.  

## 2014-05-21 NOTE — Assessment & Plan Note (Signed)
History of coronary artery disease status post coronary artery bypass grafting 04/19/12 by Dr. Kathlee NationsPeter Van Trigt. . He had a LIMA to his LAD, vein to diagonal branch, obtuse marginal branch and distal circumflex as well as to the PDA. This was in the setting of a non-STEMI. 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

## 2014-05-21 NOTE — Assessment & Plan Note (Signed)
History of hypertension with blood pressure measured today 122/60. He is on metoprolol and losartan. Continue current meds at current dosing

## 2014-05-21 NOTE — Assessment & Plan Note (Signed)
History of hyperlipidemia on Crestor 10 mg every other day. His most recent lipid profile performed 05/19/14 revealed a total cholesterol 176, LDL 92 HDL of 64

## 2014-08-26 ENCOUNTER — Other Ambulatory Visit: Payer: Self-pay | Admitting: *Deleted

## 2014-08-26 MED ORDER — LOSARTAN POTASSIUM 25 MG PO TABS: 25.0000 mg | ORAL_TABLET | Freq: Every day | ORAL | Status: DC

## 2014-08-27 ENCOUNTER — Other Ambulatory Visit: Payer: Self-pay | Admitting: Cardiovascular Disease

## 2014-08-27 NOTE — Telephone Encounter (Signed)
E-sent to local pharmacy

## 2014-10-02 ENCOUNTER — Telehealth: Payer: Self-pay | Admitting: Cardiovascular Disease

## 2014-10-02 MED ORDER — ROSUVASTATIN CALCIUM 10 MG PO TABS: ORAL_TABLET | ORAL | Status: DC

## 2014-10-02 NOTE — Telephone Encounter (Signed)
Received a call from patient he stated he received the generic crestor 20 mg and it is very hard to cut in half.Stated he would like a new prescription for crestor 10 mg sent to his pharmacy.Generic crestor 10 mg take every other day sent to pharmacy.

## 2014-10-03 IMAGING — CR DG CHEST 2V
2 series · 2 of 2 positions shown · non-contrast
Comparison: 04/21/2012

CLINICAL DATA: Postop from CABG.

CHEST - 2 VIEW

[w chest pa]
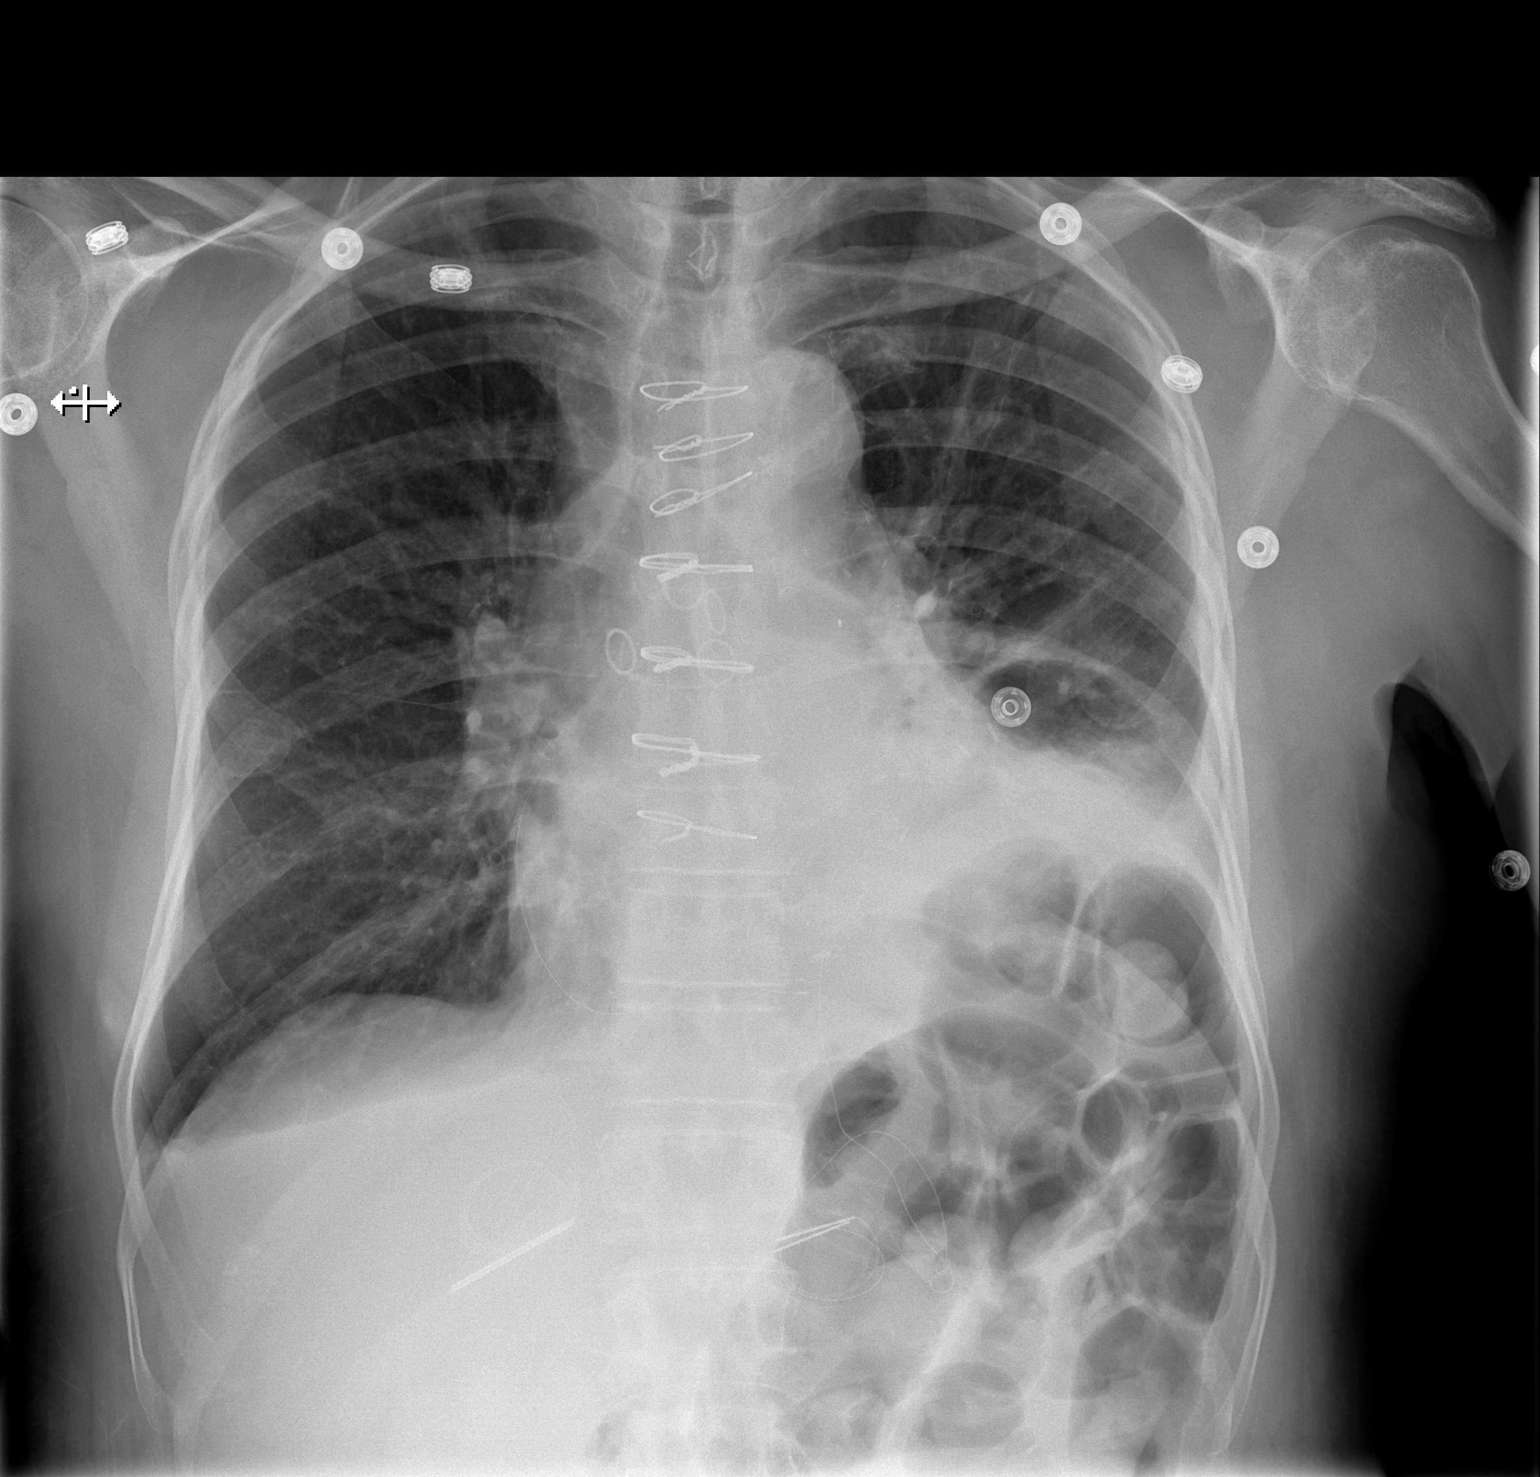

[w chest lat]
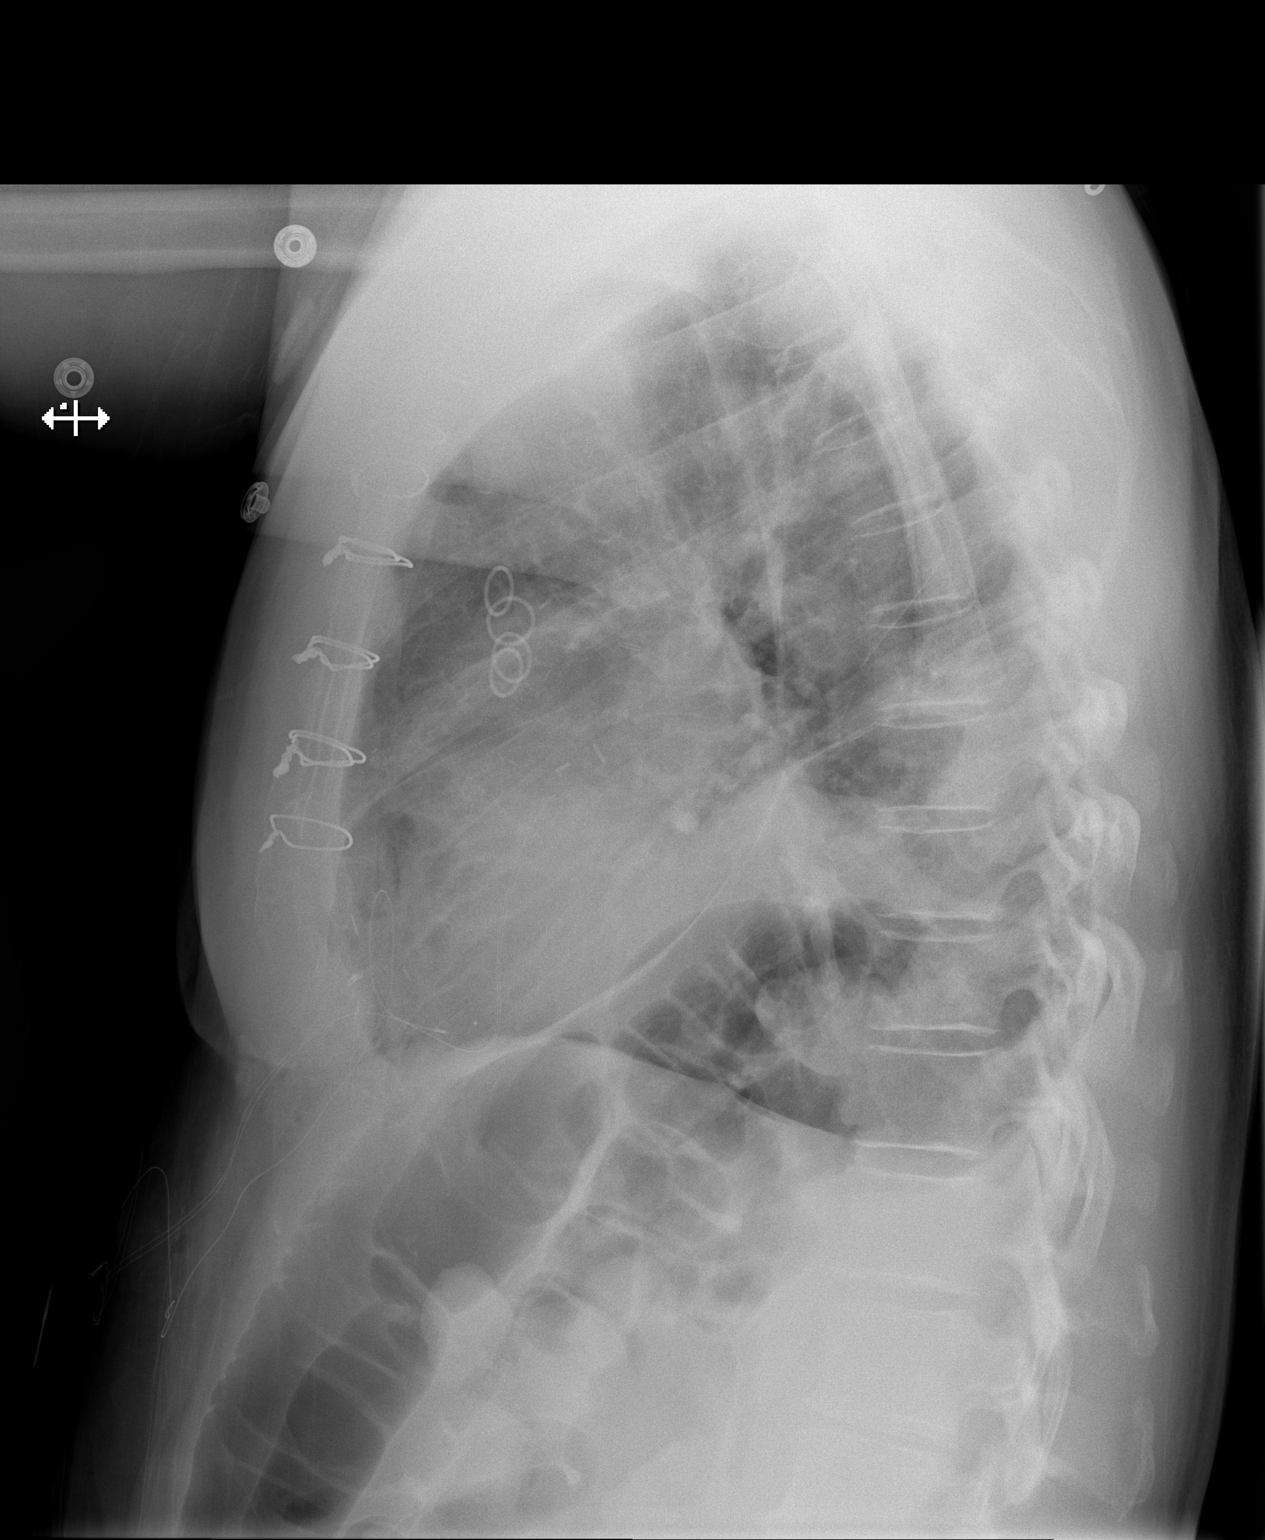

[2 of 2 positions shown; findings below may reference images not displayed]

FINDINGS: The left chest tube has been removed.  No pneumothorax
identified.  Persistent left lower lobe atelectasis is again
demonstrated.  Small right pleural effusion is stable.  Right lung
is otherwise clear.  The heart size and mediastinal contours are
also stable.  The right jugular Cordis has been removed.
IMPRESSION: 1.  No evidence of pneumothorax following left chest tube and right
jugular Cordis removal.
2.  Persistent left basilar atelectasis and small right pleural
effusion.

## 2014-11-09 ENCOUNTER — Other Ambulatory Visit: Payer: Self-pay

## 2014-11-21 ENCOUNTER — Other Ambulatory Visit: Payer: Self-pay | Admitting: Cardiovascular Disease

## 2014-11-24 ENCOUNTER — Other Ambulatory Visit: Payer: Self-pay | Admitting: Cardiovascular Disease

## 2015-06-16 ENCOUNTER — Encounter: Payer: Self-pay | Admitting: Cardiovascular Disease

## 2015-06-16 ENCOUNTER — Ambulatory Visit (INDEPENDENT_AMBULATORY_CARE_PROVIDER_SITE_OTHER): Payer: BC Managed Care – PPO | Admitting: Cardiovascular Disease

## 2015-06-16 VITALS — BP 130/68 | HR 60 | Ht 71.0 in

## 2015-06-16 DIAGNOSIS — I10 Essential (primary) hypertension: Secondary | ICD-10-CM | POA: Diagnosis not present

## 2015-06-16 DIAGNOSIS — E785 Hyperlipidemia, unspecified: Secondary | ICD-10-CM

## 2015-06-16 DIAGNOSIS — I2510 Atherosclerotic heart disease of native coronary artery without angina pectoris: Secondary | ICD-10-CM

## 2015-06-16 NOTE — Patient Instructions (Addendum)
Dr Berry wants you to follow-up in 1 year . You will receive a reminder letter in the mail two months in advance. If you don't receive a letter, please call our office to schedule the follow-up appointment. 

## 2015-06-16 NOTE — Progress Notes (Signed)
06/16/2015 [name], MD   April 09, 1946  [id number]  Primary Physician Eartha Inch, MD Primary Cardiologist: Runell Gess MD [name]   HPI:  Tyler Fox is a 70 year old, thin and fit-appearing, married [place] American male, father of 2, grandfather to 1 grandchild who I last saw 05/21/14.Marland Kitchen He presented to [facility] April 16, 2012, with a non-ST-segment-elevation myocardial infarction. He was catheterized by Dr. Bryan Lemma radially on April 17, 2012, revealing 2-vessel disease. His EF was normal with mild interior hypokinesia. He underwent coronary artery bypass grafting x5 by Dr. Kathlee Nations Trigt April 19, 2012, with a LIMA to his LAD, a vein to his diagonal branch, a vein to an obtuse marginal branch and distal circumflex. There was also a vein to a PDA. His postop course was uncomplicated. He was seen at [facility] May 07, 2012, with delirium and was found to have a UTI. Cultures revealed Klebsiella pneumoniae. He was treated with Keflex and subsequently improved.I saw him in the office 02/19/13. He has done well since. 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 Since I saw him a month ago he remains asymptomatic.   Current Outpatient Prescriptions  Medication Sig Dispense Refill  . aspirin EC 81 MG EC tablet Take 1 tablet (81 mg total) by mouth daily.    Marland Kitchen Co-Enzyme Q-10 100 MG CAPS Take 100 mg by mouth daily.     Marland Kitchen losartan (COZAAR) 25 MG tablet TAKE 1 TABLET (25 MG TOTAL) BY MOUTH DAILY. 30 tablet 4  . MEGARED OMEGA-3 KRILL OIL 500 MG CAPS Take 1 capsule by mouth daily.    . metoprolol succinate (TOPROL-XL) 12.5 mg TB24 24 hr tablet Take 12.5 mg by mouth 2 (two) times daily.    . metoprolol tartrate (LOPRESSOR) 25 MG tablet  TAKE 1 TABLET (25 MG TOTAL) BY MOUTH 2 (TWO) TIMES DAILY. 60 tablet 5  . Multiple Vitamin (MULTIVITAMIN WITH MINERALS) TABS Take 1 tablet by mouth daily.    . Omega-3 Fatty Acids (FISH OIL PO) Take 1 tablet by mouth daily.     . rosuvastatin (CRESTOR) 10 MG tablet Take 10 mg every other day 30 tablet 6   No current facility-administered medications for this visit.    Allergies  Allergen Reactions  . Ace Inhibitors Other (See Comments)    cough  . Lisinopril Cough  . Oxycodone     Hallucinations    Social History   Social History  . Marital Status: Married    Spouse Name: N/A  . Number of Children: N/A  . Years of Education: N/A   Occupational History  . Not on file.   Social History Main Topics  . Smoking status: Former Smoker -- 0.25 packs/day for 1 years    Types: Cigarettes    Quit date: 04/20/1967  . Smokeless tobacco: Never Used  . Alcohol Use: No  . Drug Use: No  . Sexual Activity: Not on file   Other Topics Concern  . Not on file   Social History Narrative     Review of Systems: General: negative for chills, fever, night sweats or weight changes.  Cardiovascular: negative for chest pain, dyspnea on exertion, edema, orthopnea, palpitations, paroxysmal nocturnal dyspnea or shortness of breath Dermatological: negative for rash Respiratory: negative  for cough or wheezing Urologic: negative for hematuria Abdominal: negative for nausea, vomiting, diarrhea, bright red blood per rectum, melena, or hematemesis Neurologic: negative for visual changes, syncope, or dizziness All other systems reviewed and are otherwise negative except as noted above.    Blood pressure 130/68, pulse 60, height  (1.803 m).  General appearance: alert and no distress Neck: no adenopathy, no carotid bruit, no JVD, supple, symmetrical, trachea midline and thyroid not enlarged, symmetric, no tenderness/mass/nodules Lungs: clear to auscultation bilaterally Heart: regular rate and  rhythm, S1, S2 normal, no murmur, click, rub or gallop Extremities: extremities normal, atraumatic, no cyanosis or edema  EKG sinus bradycardia 55 without ST or T-wave changes. I personally reviewed this EKG  ASSESSMENT AND PLAN:   Hyperlipidemia History of hyperlipidemia on statin therapy followed by his PCP  Essential hypertension History of hypertension with blood pressure measured today 130/68. He is on losartan and metoprolol. Continue current meds at current dosing  CAD, multiple vessel History of CAD status post coronary artery bypass grafting X 5 by Dr. Donata Clay 04/19/12 the LIMA to his LAD, vein to diagonal branch, the marginal branch of the distal circumflex coronary artery as well as to the PDA. He has done well since. He denies chest pain or shortness of breath.      Runell Gess MD FACP,FACC,FAHA, [facility] 06/16/2015 4:31 PM

## 2015-06-16 NOTE — Assessment & Plan Note (Signed)
History of CAD status post coronary artery bypass grafting X 5 by Dr. Donata Clay 04/19/12 the LIMA to his LAD, vein to diagonal branch, the marginal branch of the distal circumflex coronary artery as well as to the PDA. He has done well since. He denies chest pain or shortness of breath.

## 2015-06-16 NOTE — Assessment & Plan Note (Signed)
History of hypertension with blood pressure measured today 130/68. He is on losartan and metoprolol. Continue current meds at current dosing

## 2015-06-16 NOTE — Assessment & Plan Note (Signed)
History of hyperlipidemia on statin therapy followed by his PCP 

## 2015-07-29 ENCOUNTER — Other Ambulatory Visit: Payer: Self-pay | Admitting: Cardiovascular Disease

## 2015-07-29 NOTE — Telephone Encounter (Signed)
REFILL 

## 2015-08-22 ENCOUNTER — Other Ambulatory Visit: Payer: Self-pay | Admitting: Cardiovascular Disease

## 2015-08-23 NOTE — Telephone Encounter (Signed)
REFILL 

## 2015-09-02 ENCOUNTER — Other Ambulatory Visit: Payer: Self-pay | Admitting: Cardiovascular Disease

## 2015-09-02 NOTE — Telephone Encounter (Signed)
REFILL 

## 2015-09-18 ENCOUNTER — Other Ambulatory Visit: Payer: Self-pay | Admitting: Cardiovascular Disease

## 2015-09-20 NOTE — Telephone Encounter (Signed)
Rx request sent to pharmacy.  

## 2016-04-08 ENCOUNTER — Other Ambulatory Visit: Payer: Self-pay | Admitting: Cardiovascular Disease

## 2016-04-10 NOTE — Telephone Encounter (Signed)
Rx request sent to pharmacy.  

## 2016-06-13 ENCOUNTER — Other Ambulatory Visit: Payer: Self-pay

## 2016-06-13 MED ORDER — LOSARTAN POTASSIUM 25 MG PO TABS: ORAL_TABLET | ORAL | 0 refills | Status: DC

## 2016-06-14 ENCOUNTER — Other Ambulatory Visit: Payer: Self-pay

## 2016-06-14 MED ORDER — LOSARTAN POTASSIUM 25 MG PO TABS: ORAL_TABLET | ORAL | 3 refills | Status: DC

## 2016-06-27 ENCOUNTER — Encounter: Payer: Self-pay | Admitting: Cardiovascular Disease

## 2016-06-27 ENCOUNTER — Ambulatory Visit (INDEPENDENT_AMBULATORY_CARE_PROVIDER_SITE_OTHER): Payer: Medicare Other | Admitting: Cardiovascular Disease

## 2016-06-27 VITALS — BP 149/69 | HR 50 | Ht 71.0 in

## 2016-06-27 DIAGNOSIS — E7800 Pure hypercholesterolemia, unspecified: Secondary | ICD-10-CM

## 2016-06-27 DIAGNOSIS — Z951 Presence of aortocoronary bypass graft: Secondary | ICD-10-CM | POA: Diagnosis not present

## 2016-06-27 DIAGNOSIS — I10 Essential (primary) hypertension: Secondary | ICD-10-CM | POA: Diagnosis not present

## 2016-06-27 NOTE — Patient Instructions (Signed)
Medication Instructions: Your physician recommends that you continue on your current medications as directed. Please refer to the Current Medication list given to you today.   Labwork: I will request labs from Dr. Cyndia BentBadger.   Follow-Up: Your physician wants you to follow-up in: 1 year with Dr. Allyson SabalBerry. You will receive a reminder letter in the mail two months in advance. If you don't receive a letter, please call our office to schedule the follow-up appointment.  If you need a refill on your cardiac medications before your next appointment, please call your pharmacy.

## 2016-06-27 NOTE — Assessment & Plan Note (Signed)
History of hypertension blood pressure measures 149/69. He is on losartan and metoprolol. Continue current meds at current dosing

## 2016-06-27 NOTE — Progress Notes (Signed)
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 He [facility] 16, [facility], with a non-ST-segment-elevation myocardial infarction. He was catheterized by Dr. Bryan Lemmaavid Harding radially on April 17, 2012, revealing 2-vessel disease. His EF was normal with mild interior hypokinesia. He underwent coronary artery bypass grafting x5 by Dr. Kathlee NationsPeter Van Trigt April 19, 2012, with a LIMA to his LAD, a vein to his diagonal branch, a vein to an obtuse marginal branch and distal circumflex. There was also a vein to a PDA. His postop course was uncomplicated. [facility] 07, [facility], with delirium and was found to have a UTI. Cultures revealed Klebsiella pneumoniae. He was treated with Keflex and subsequently improved.I saw him in the office 02/19/13. He has done well since. 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 Since I saw him a year ago he remains asymptomatic. 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   Current Outpatient Prescriptions  Medication Sig Dispense Refill  . aspirin EC 81 MG EC tablet Take 1 tablet (81 mg total) by mouth daily.    Marland Kitchen. Co-Enzyme Q-10 100 MG CAPS Take 100 mg by mouth daily.     Marland Kitchen. latanoprost (XALATAN) 0.005 % ophthalmic solution Apply 1 drop to eye at bedtime.    Marland Kitchen. losartan (COZAAR) 25 MG tablet TAKE 1 TABLET (25 MG TOTAL) BY MOUTH DAILY. Please keep upcoming appointment 90 tablet 3  . MEGARED OMEGA-3 KRILL OIL 500 MG CAPS Take  1 capsule by mouth daily.    . metoprolol succinate (TOPROL-XL) 12.5 mg TB24 24 hr tablet Take 12.5 mg by mouth 2 (two) times daily.    . Multiple Vitamin (MULTIVITAMIN WITH MINERALS) TABS Take 1 tablet by mouth daily.    . Omega-3 Fatty Acids (FISH OIL PO) Take 1 tablet by mouth daily.     . rosuvastatin (CRESTOR) 10 MG tablet TAKE 1 TABLET BY MOUTH EVERY OTHER DAY 30 tablet 11   No current facility-administered medications for this visit.     Allergies  Allergen Reactions  . Ace Inhibitors Other (See Comments)    cough  . Lisinopril Cough  . Oxycodone     Hallucinations    Social History   Social History  . Marital status: Married    Spouse name: N/A  . Number of children: N/A  . Years of education: N/A   Occupational History  . Not on file.   Social History Main Topics  . Smoking status: Former Smoker    Packs/day: 0.25    Years: 1.00    Types: Cigarettes    Quit date: 04/20/1967  . Smokeless tobacco: Never Used  . Alcohol use No  . Drug use: No  . Sexual activity: Not on file   Other Topics Concern  . Not on file   Social History Narrative  . No narrative on file     Review of Systems: General: negative for chills, fever, night sweats or weight changes.  Cardiovascular: negative for chest pain, dyspnea on exertion, edema, orthopnea, palpitations, paroxysmal nocturnal dyspnea or shortness of breath Dermatological: negative for rash Respiratory: negative for cough or wheezing Urologic: negative for hematuria Abdominal: negative for nausea, vomiting, diarrhea, bright red blood per rectum, melena, or hematemesis Neurologic: negative for visual changes, syncope, or dizziness All other systems reviewed and are otherwise negative except as noted above.    Blood pressure (!) 149/69, pulse (!) 50, height 5\' 11"  (1.803 m), SpO2 99 %.  General appearance: alert and no distress Neck: no adenopathy, no carotid bruit, no JVD, supple, symmetrical, trachea midline and  thyroid not enlarged, symmetric, no tenderness/mass/nodules Lungs: clear to auscultation bilaterally Heart: regular rate and rhythm, S1, S2 normal, no murmur, click, rub or gallop Extremities: extremities normal, atraumatic, no cyanosis or edema  EKG sinus bradycardia at 50 with septal Q waves. I personally reviewed this EKG  ASSESSMENT AND PLAN:   Hyperlipidemia History of hyperlipidemia on statin therapy followed by his PCP  Essential hypertension History of hypertension blood pressure measures 149/69. He is on losartan and metoprolol. 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 Kathlee Nations Trigt 04/19/12 with a LIMA to his LAD, vein to diagonal branch, and to the distal circumflex. Postop course was uncomplicated. He denies chest pain or shortness of breath.      Runell Gess MD FACP,FACC,FAHA, [facility] 06/27/2016 3:59 PM

## 2016-06-27 NOTE — Assessment & Plan Note (Signed)
History of CAD status post coronary artery bypass grafting by Dr. Kathlee NationsPeter Van Trigt 04/19/12 with a LIMA to his LAD, vein to diagonal branch, and to the distal circumflex. Postop course was uncomplicated. He denies chest pain or shortness of breath.

## 2016-06-27 NOTE — Assessment & Plan Note (Signed)
History of hyperlipidemia on statin therapy followed by his PCP 

## 2016-07-07 ENCOUNTER — Ambulatory Visit: Payer: BC Managed Care – PPO | Admitting: Cardiovascular Disease

## 2016-08-29 ENCOUNTER — Other Ambulatory Visit: Payer: Self-pay | Admitting: Cardiovascular Disease

## 2016-08-29 NOTE — Telephone Encounter (Signed)
Rx(s) sent to pharmacy electronically.  

## 2016-11-04 ENCOUNTER — Other Ambulatory Visit: Payer: Self-pay | Admitting: Cardiovascular Disease

## 2016-11-06 NOTE — Telephone Encounter (Signed)
Rx(s) sent to pharmacy electronically.  

## 2017-05-28 ENCOUNTER — Other Ambulatory Visit: Payer: Self-pay

## 2017-05-28 MED ORDER — METOPROLOL TARTRATE 25 MG PO TABS: 25.0000 mg | ORAL_TABLET | Freq: Two times a day (BID) | ORAL | 1 refills | Status: DC

## 2017-05-28 NOTE — Telephone Encounter (Signed)
Rx(s) sent to pharmacy electronically.  

## 2017-05-29 ENCOUNTER — Other Ambulatory Visit: Payer: Self-pay

## 2017-06-29 ENCOUNTER — Other Ambulatory Visit: Payer: Self-pay | Admitting: Cardiovascular Disease

## 2017-07-30 ENCOUNTER — Telehealth: Payer: Self-pay | Admitting: Cardiovascular Disease

## 2017-07-30 DIAGNOSIS — E7800 Pure hypercholesterolemia, unspecified: Secondary | ICD-10-CM

## 2017-07-30 DIAGNOSIS — I2510 Atherosclerotic heart disease of native coronary artery without angina pectoris: Secondary | ICD-10-CM

## 2017-07-30 NOTE — Telephone Encounter (Signed)
New Message:   Pt has an appt on 4/2 for a follow up and would like to know if he needs to gets labs done prior to appt.

## 2017-07-30 NOTE — Telephone Encounter (Signed)
Can to office prior to 4/2 to have lab work done no appointment needed .   Lipid, hepatic ordered ,patient aware -- come after 8 am.

## 2017-08-10 LAB — LIPID PANEL
Chol/HDL Ratio: 2.8 ratio (ref 0.0–5.0)
Cholesterol, Total: 149 mg/dL (ref 100–199)
HDL: 54 mg/dL (ref >39)
LDL Calculated: 80 mg/dL (ref 0–99)
TRIGLYCERIDES: 75 mg/dL (ref 0–149)
VLDL CHOLESTEROL CAL: 15 mg/dL (ref 5–40)

## 2017-08-10 LAB — HEPATIC FUNCTION PANEL
ALT: 21 IU/L (ref 0–44)
AST: 27 IU/L (ref 0–40)
Albumin: 4.2 g/dL (ref 3.5–4.8)
Alkaline Phosphatase: 52 IU/L (ref 39–117)
Bilirubin Total: 1.1 mg/dL (ref 0.0–1.2)
Bilirubin, Direct: 0.29 mg/dL (ref 0.00–0.40)
Total Protein: 6.2 g/dL (ref 6.0–8.5)

## 2017-08-14 ENCOUNTER — Ambulatory Visit: Payer: Medicare Other | Admitting: Cardiovascular Disease

## 2017-08-14 ENCOUNTER — Encounter: Payer: Self-pay | Admitting: Cardiovascular Disease

## 2017-08-14 VITALS — BP 108/52 | HR 52 | Ht 71.75 in | Wt 179.0 lb

## 2017-08-14 DIAGNOSIS — Z951 Presence of aortocoronary bypass graft: Secondary | ICD-10-CM

## 2017-08-14 DIAGNOSIS — I10 Essential (primary) hypertension: Secondary | ICD-10-CM | POA: Diagnosis not present

## 2017-08-14 DIAGNOSIS — E7800 Pure hypercholesterolemia, unspecified: Secondary | ICD-10-CM

## 2017-08-14 DIAGNOSIS — I2510 Atherosclerotic heart disease of native coronary artery without angina pectoris: Secondary | ICD-10-CM

## 2017-08-14 NOTE — Assessment & Plan Note (Signed)
History of essential hypertension blood pressure measured 108/52. He is on losartan and metoprolol. Continue current meds at current dosing.

## 2017-08-14 NOTE — Assessment & Plan Note (Signed)
History of hyperlipidemia on statin therapy taking Crestor every other day with a recent lipid profile performed 08/10/17 revealing LDL 80 and HDL of 54.

## 2017-08-14 NOTE — Assessment & Plan Note (Signed)
History of CAD with history of a non-STEMI 04/16/12 catheterization performed by Dr. Herbie BaltimoreHarding the following day revealing two-vessel disease normal EF. 1 coronary artery bypass grafting 04/19/12 by Dr. Kathlee NationsPeter Van Trigt with a LIMA to his LAD, vein to diagonal branch, obtuse marginal branch and distal circumflex. He denies chest pain or shortness of breath.

## 2017-08-14 NOTE — Patient Instructions (Signed)
Your physician recommends that you continue on your current medications as directed. Please refer to the Current Medication list given to you today.   Your physician wants you to follow-up in: 1 year with Dr Berry. You will receive a reminder letter in the mail two months in advance. If you don't receive a letter, please call our office to schedule the follow-up appointment.  

## 2017-08-14 NOTE — Progress Notes (Signed)
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  thin and fit-appearing, married [place] male, father of 2, grandfather to 1 grandchild who I last saw 06/27/16.Marland Kitchen. He presented to [facility] April 16, 2012, with a non-ST-segment-elevation myocardial infarction. He was catheterized by Dr. Bryan Lemmaavid Harding radially on April 17, 2012, revealing 2-vessel disease. His EF was normal with mild interior hypokinesia. He underwent coronary artery bypass grafting x5 by Dr. Kathlee NationsPeter Van Trigt April 19, 2012, with a LIMA to his LAD, a vein to his diagonal branch, a vein to an obtuse marginal branch and distal circumflex. There was also a vein to a PDA. His postop course was uncomplicated. [facility] 07, 2012, with delirium and was found to have a UTI. Cultures revealed Klebsiella pneumoniae. He was treated with Keflex and subsequently improved.I saw him in the office 02/19/13. He has done well since. 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 Since I saw him a year ago he remains asymptomatic. 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  Since I saw me undergo his remaining stable. Exercises that she'll treadmill 3-4 times a week walking 3-4 miles a day without limitation.     Current Meds  Medication Sig  . aspirin EC 81 MG EC tablet Take 1 tablet (81 mg total) by mouth daily.  Marland Kitchen. Co-Enzyme Q-10 100 MG CAPS Take 100 mg by mouth daily.   . COMBIGAN 0.2-0.5 % ophthalmic solution Place 1 drop into both eyes 2 (two) times daily.  Marland Kitchen. losartan (COZAAR) 25 MG tablet TAKE 1 TABLET (25 MG  TOTAL) BY MOUTH DAILY. PLEASE KEEP UPCOMING APPOINTMENT  . MEGARED OMEGA-3 KRILL OIL 500 MG CAPS Take 1 capsule by mouth daily.  . metoprolol succinate (TOPROL-XL) 12.5 mg TB24 24 hr tablet Take 12.5 mg by mouth 2 (two) times daily.  . Multiple Vitamin (MULTIVITAMIN WITH MINERALS) TABS Take 1 tablet by mouth daily.  Marland Kitchen. OVER THE COUNTER MEDICATION Take 800 mg by mouth daily.  Marland Kitchen. OVER THE COUNTER MEDICATION Take 400 mg by mouth daily.  . rosuvastatin (CRESTOR) 10 MG tablet TAKE 1 TABLET BY MOUTH EVERY OTHER DAY     Allergies  Allergen Reactions  . Ace Inhibitors Other (See Comments)    cough  . Lisinopril Cough  . Oxycodone     Hallucinations    Social History   Socioeconomic History  . Marital status: Married    Spouse name: Not on file  . Number of children: Not on file  . Years of education: Not on file  . Highest education level: Not on file  Occupational History  . Not on file  Social Needs  . Financial resource strain: Not on file  . Food insecurity:    Worry: Not on file    Inability: Not on file  . Transportation needs:    Medical: Not on file    Non-medical: Not on file  Tobacco Use  . Smoking status: Former Smoker    Packs/day: 0.25    Years: 1.00    Pack years: 0.25  Types: Cigarettes    Last attempt to quit: 04/20/1967    Years since quitting: 50.3  . Smokeless tobacco: Never Used  Substance and Sexual Activity  . Alcohol use: No  . Drug use: No  . Sexual activity: Not on file  Lifestyle  . Physical activity:    Days per week: Not on file    Minutes per session: Not on file  . Stress: Not on file  Relationships  . Social connections:    Talks on phone: Not on file    Gets together: Not on file    Attends religious service: Not on file    Active member of club or organization: Not on file    Attends meetings of clubs or organizations: Not on file    Relationship status: Not on file  . Intimate partner violence:    Fear of current or ex partner:  Not on file    Emotionally abused: Not on file    Physically abused: Not on file    Forced sexual activity: Not on file  Other Topics Concern  . Not on file  Social History Narrative  . Not on file     Review of Systems: General: negative for chills, fever, night sweats or weight changes.  Cardiovascular: negative for chest pain, dyspnea on exertion, edema, orthopnea, palpitations, paroxysmal nocturnal dyspnea or shortness of breath Dermatological: negative for rash Respiratory: negative for cough or wheezing Urologic: negative for hematuria Abdominal: negative for nausea, vomiting, diarrhea, bright red blood per rectum, melena, or hematemesis Neurologic: negative for visual changes, syncope, or dizziness All other systems reviewed and are otherwise negative except as noted above.    Blood pressure (!) 108/52, pulse (!) 52, height 5' 11.75" (1.822 m), weight 179 lb (81.2 kg).  General appearance: alert and no distress Neck: no adenopathy, no carotid bruit, no JVD, supple, symmetrical, trachea midline and thyroid not enlarged, symmetric, no tenderness/mass/nodules Lungs: clear to auscultation bilaterally Heart: regular rate and rhythm, S1, S2 normal, no murmur, click, rub or gallop Extremities: extremities normal, atraumatic, no cyanosis or edema Pulses: 2+ and symmetric Skin: Skin color, texture, turgor normal. No rashes or lesions Neurologic: Alert and oriented X 3, normal strength and tone. Normal symmetric reflexes. Normal coordination and gait  EKG sinus bradycardia at 52 with nonspecific ST and T-wave changes. I personally reviewed this EKG.  ASSESSMENT AND PLAN:   Hyperlipidemia History of hyperlipidemia on statin therapy taking Crestor every other day with a recent lipid profile performed 08/10/17 revealing LDL 80 and HDL of 54.  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 Herbie Baltimore the following day revealing  two-vessel disease normal EF. 1 coronary artery bypass grafting 04/19/12 by Dr. Kathlee Nations Trigt with a LIMA to his LAD, vein to diagonal branch, obtuse marginal branch and distal circumflex. He denies chest pain or shortness of breath.  Essential hypertension History of essential hypertension blood pressure measured 108/52. He is on losartan and metoprolol. Continue current meds at current dosing.      Runell Gess MD FACP,FACC,FAHA, [facility] 08/14/2017 11:01 AM

## 2017-11-17 ENCOUNTER — Other Ambulatory Visit: Payer: Self-pay | Admitting: Cardiovascular Disease

## 2017-11-19 NOTE — Telephone Encounter (Signed)
Rx request sent to pharmacy.  

## 2017-12-31 ENCOUNTER — Other Ambulatory Visit: Payer: Self-pay | Admitting: Cardiovascular Disease

## 2018-01-16 ENCOUNTER — Telehealth: Payer: Self-pay | Admitting: Cardiovascular Disease

## 2018-01-16 NOTE — Telephone Encounter (Signed)
° °  [facility] Pre-operative Risk Assessment    Request for surgical clearance:  1. What type of surgery is being performed? No Mesh Rt Inguinal Hernia Repair   2. When is this surgery scheduled? 02-12-18  3. What type of clearance is required (medical clearance vs. Pharmacy clearance to hold med vs. Both)? Need Cardiac Clearance-also ekg, and labs  4. Are there any medications that need to be held prior to surgery and how long?No   5. Practice name and name of physician performing surgery? Dr Becky Augusta    6. What is your office phone [phone]    7.   What is your office fax number [phone]  8.   Anesthesia type (None, local, MAC, general) ? MAC and Local   Tyler Fox 01/16/2018, 3:49 PM  _________________________________________________________________   (provider comments below)

## 2018-01-17 NOTE — Telephone Encounter (Signed)
   Primary Cardiologist: Nanetta Batty, MD  Chart reviewed as part of pre-operative protocol coverage. Patient was contacted 01/17/2018 in reference to pre-operative risk assessment for pending surgery as outlined below.  Tyler Seta, MD was last seen on  08/14/17  by Dr. Allyson Sabal.  Since that day, Tyler Seta, MD has done well. He walks 2-4 miles on treadmill each time 2-3 times/week. No limiting symptoms.   Therefore, based on ACC/AHA guidelines, the patient would be at acceptable risk for the planned procedure without further cardiovascular testing.   He takes metoprolol 12.5mg  BID. Has normal Blood pressure. Noted HR of low to mid 50s most of time. Intermittently dropping to 40s. Asymptomatic.  Dr. Allyson Sabal, should we continue BB? He is not required to hold ASA for surgery.   [place], [place] 01/17/2018, 3:42 PM

## 2018-01-18 NOTE — Telephone Encounter (Signed)
Inguinal hernia repair is a low risk procedure.  He is been asymptomatic since his bypass graft surgery.  He is cleared at low risk.

## 2018-01-21 NOTE — Telephone Encounter (Signed)
   Primary Cardiologist: Nanetta Batty, MD  Chart reviewed by Dr Allyson Sabal as part of pre-operative protocol coverage. Given past medical history and time since last visit, based on ACC/AHA guidelines, [name], MD would be at acceptable risk for the planned procedure without further cardiovascular testing.   OK to hold aspirin pre op if needed.  I will route this recommendation to the requesting party via Epic fax function and remove from pre-op pool.  Please call with questions.  Corine Shelter, PA-C 01/21/2018, 2:58 PM

## 2018-01-28 ENCOUNTER — Telehealth: Payer: Self-pay | Admitting: Cardiovascular Disease

## 2018-01-28 NOTE — Telephone Encounter (Signed)
Spoke with pt who states he was calling to check on status of pre-op clearance. Advised that per chart review, he is cleared for surgery and an e-fax has been sent over to the requesting party. Verbalized understanding.

## 2018-01-28 NOTE — Telephone Encounter (Signed)
New Message:    Patient to check the status of the surgical clearance

## 2018-01-29 ENCOUNTER — Telehealth: Payer: Self-pay | Admitting: Cardiovascular Disease

## 2018-01-29 NOTE — Telephone Encounter (Signed)
Returned pts call.  He was calling in re: surgical clearance request. The [place] of [place] for this surgery and they are requesting a recent EKG and lab work. Per Boyce MediciBrittany Simmons, PA-C, pt should just come in for ov and get all this complete. Pt has been scheduled to see Corine ShelterLuke Kilroy, PA-C 02/05/18

## 2018-01-29 NOTE — Telephone Encounter (Signed)
New Message   Pt states he is calling to check on the status of his clearance. Please call

## 2018-02-05 ENCOUNTER — Ambulatory Visit: Payer: Medicare Other | Admitting: Cardiology

## 2018-02-05 ENCOUNTER — Encounter: Payer: Self-pay | Admitting: Cardiology

## 2018-02-05 VITALS — BP 122/70 | HR 55 | Ht 71.0 in | Wt 175.4 lb

## 2018-02-05 DIAGNOSIS — Z01818 Encounter for other preprocedural examination: Secondary | ICD-10-CM

## 2018-02-05 DIAGNOSIS — I2510 Atherosclerotic heart disease of native coronary artery without angina pectoris: Secondary | ICD-10-CM

## 2018-02-05 DIAGNOSIS — Z951 Presence of aortocoronary bypass graft: Secondary | ICD-10-CM | POA: Diagnosis not present

## 2018-02-05 DIAGNOSIS — E785 Hyperlipidemia, unspecified: Secondary | ICD-10-CM

## 2018-02-05 DIAGNOSIS — I214 Non-ST elevation (NSTEMI) myocardial infarction: Secondary | ICD-10-CM | POA: Diagnosis not present

## 2018-02-05 DIAGNOSIS — R001 Bradycardia, unspecified: Secondary | ICD-10-CM

## 2018-02-05 DIAGNOSIS — I10 Essential (primary) hypertension: Secondary | ICD-10-CM

## 2018-02-05 NOTE — Assessment & Plan Note (Signed)
Chart reviewed and patient interviewed and examined as part of pre-operative protocol coverage. Given past medical history and time since last visit, based on ACC/AHA guidelines, Herbert Setaharles E Neale, MD would be at acceptable risk for the planned procedure without further cardiovascular testing.

## 2018-02-05 NOTE — Progress Notes (Signed)
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 In December 2013 he presented with a NSTEMI. He ultimately had a CABG x 5 with an LIMA-LAD, SVG-Dx, SVG-OM, SVG-PDA, and SVG -CFX.  Post op he had a paralyzed left hemidiaphragm that resolved in a few months. He has done well since. He walks 2 -3 miles a day for exercise. His HR has been noted be a little low at times but he denies any unusual dyspnea, near syncope., or fatigue. He is in the office today for pre op clearance. [facility] [place] to have a "non mesh" hernia repair, apparently this is not available in [place].    Current Outpatient Medications  Medication Sig Dispense Refill  . aspirin EC 81 MG EC tablet Take 1 tablet (81 mg total) by mouth daily.    Marland Kitchen. Co-Enzyme Q-10 100 MG CAPS Take 100 mg by mouth daily.     . COMBIGAN 0.2-0.5 % ophthalmic solution Place 1 drop into both eyes 2 (two) times daily.  6  . losartan (COZAAR) 25 MG tablet TAKE 1 TABLET (25 MG TOTAL) BY MOUTH DAILY. PLEASE KEEP UPCOMING APPOINTMENT 90 tablet 3  . MEGARED OMEGA-3 KRILL OIL 500 MG CAPS Take 1 capsule by mouth daily.    . metoprolol tartrate (LOPRESSOR) 25 MG tablet Take 0.5 tablets (12.5 mg total) by mouth 2 (two) times daily. 30 tablet 9  . Multiple Vitamin (MULTIVITAMIN WITH MINERALS) TABS Take 1 tablet by mouth daily.    Marland Kitchen. OVER THE COUNTER MEDICATION Take 800 mg by mouth daily.    Marland Kitchen. OVER THE COUNTER MEDICATION Take 400 mg by mouth daily.    . rosuvastatin (CRESTOR) 10 MG tablet TAKE 1 TABLET BY MOUTH EVERY OTHER DAY 90 tablet 2   No current facility-administered medications for this visit.     Allergies  Allergen Reactions  . Ace Inhibitors Other (See Comments)    cough  . Lisinopril Cough  . Oxycodone     Hallucinations    Past Medical History:  Diagnosis  Date  . CAD, multiple vessel 04/17/12   RCA - mid 80%, distal 100% (R-R, L-R collaterals), LAD tandem prox& mid 70-80%, LCx-OM 60-70% stenoses; Preserved EF  . Enlarged prostate   . Gallstones   . Hyperlipidemia 04/17/2012  . Hypertension   . No pertinent past medical history   . NSTEMI (non-ST elevated myocardial infarction) (HCC) 04/16/12  . 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 Marital status: Married    Spouse name: Not on file  . Number of children: Not on file  . Years of education: Not on file  . Highest education level: Not on file  Occupational History  . Not on file  Social Needs  . Financial resource strain: Not on file  . Food insecurity:    Worry: Not on file    Inability: Not on file  . Transportation needs:    Medical: Not on file    Non-medical: Not on file  Tobacco Use  . Smoking status: Former Smoker    Packs/day: 0.25    Years: 1.00    Pack years: 0.25    Types: Cigarettes    Last attempt to quit: 04/20/1967  Years since quitting: 50.8  . Smokeless tobacco: Never Used  Substance and Sexual Activity  . Alcohol use: No  . Drug use: No  . Sexual activity: Not on file  Lifestyle  . Physical activity:    Days per week: Not on file    Minutes per session: Not on file  . Stress: Not on file  Relationships  . Social connections:    Talks on phone: Not on file    Gets together: Not on file    Attends religious service: Not on file    Active member of club or organization: Not on file    Attends meetings of clubs or organizations: Not on file    Relationship status: Not on file  . Intimate partner violence:    Fear of current or ex partner: Not on file    Emotionally abused: Not on file    Physically abused: Not on file    Forced sexual activity: Not on file  Other Topics Concern  . Not on file  Social History Narrative  . Not on file     Family History  Problem Relation Age of Onset  . Heart disease  Father   . Cancer Sister        Breast  . Cancer Brother        Lung  . Heart attack Mother   . Brain cancer Brother        tumor  . Heart attack Brother   . CAD Brother   . CAD Brother   . CAD Sister      Review of Systems: General: negative for chills, fever, night sweats or weight changes.  Cardiovascular: negative for chest pain, dyspnea on exertion, edema, orthopnea, palpitations, paroxysmal nocturnal dyspnea or shortness of breath Dermatological: negative for rash Respiratory: negative for cough or wheezing Urologic: negative for hematuria Abdominal: negative for nausea, vomiting, diarrhea, bright red blood per rectum, melena, or hematemesis Neurologic: negative for visual changes, syncope, or dizziness All other systems reviewed and are otherwise negative except as noted above.    Blood pressure 122/70, pulse (!) 55, height 5\' 11"  (1.803 m), weight 175 lb 6.4 oz (79.6 kg).  General appearance: alert, cooperative and no distress Neck: no carotid bruit and no JVD Lungs: clear to auscultation bilaterally Heart: regular rate and rhythm Extremities: no edema Skin: Skin color, texture, turgor normal. No rashes or lesions Neurologic: Grossly normal  EKG Sinus bradycardia- 55, no acute changes  ASSESSMENT AND PLAN:   Pre-operative clearance Chart reviewed and patient interviewed and examined as part of pre-operative protocol coverage. Based on ACC/AHA guidelines, Tyler Seta, MD would be at acceptable (class 2) risk for the planned procedure without further cardiovascular testing.   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 Last LDL was 80 March 2019 on Crestor 10 mg QOD   PLAN  Cleared from cardiac standpoint her hernia  repair. F/U with Dr Allyson Sabal in April 2020 for his one year follow up.   Corine Shelter PA-C 02/05/2018 12:03 PM

## 2018-02-05 NOTE — Assessment & Plan Note (Signed)
Dec 2013- LIMA-LAD, SVG-OM, SVG-Dx, SVG-PDA, and SVG-dCFX

## 2018-02-05 NOTE — Assessment & Plan Note (Signed)
Sinus bradycardia without symptoms-continue low dose beta blocker

## 2018-02-05 NOTE — Assessment & Plan Note (Signed)
Intolerant to high dose statin Rx. Last LDL was 80 March 2019 on Crestor 10 mg QOD

## 2018-02-05 NOTE — Assessment & Plan Note (Signed)
Controlled.  

## 2018-02-05 NOTE — Patient Instructions (Addendum)
Medication Instructions:  Your physician recommends that you continue on your current medications as directed. Please refer to the Current Medication list given to you today.  If you need a refill on your cardiac medications before your next appointment, please call your pharmacy.  Labwork: None   Testing/Procedures: None   Follow-Up: Your physician wants you to follow-up in: 7 months with Dr Allyson SabalBerry. You will receive a reminder letter in the mail two months in advance. If you don't receive a letter, please call our office to schedule the follow-up appointment.  Any Other Special Instructions Will Be Listed Below (If Applicable). Cleared for surgery, form completed and and copy of ekg given

## 2018-02-16 ENCOUNTER — Emergency Department (HOSPITAL_COMMUNITY)
Admission: EM | Admit: 2018-02-16 | Discharge: 2018-02-16 | Disposition: A | Discharge status: Home / Self Care | Payer: Medicare Other | Attending: Emergency Medicine | Admitting: Emergency Medicine

## 2018-02-16 ENCOUNTER — Encounter (HOSPITAL_COMMUNITY): Payer: Self-pay

## 2018-02-16 ENCOUNTER — Other Ambulatory Visit: Payer: Self-pay

## 2018-02-16 DIAGNOSIS — Z7982 Long term (current) use of aspirin: Secondary | ICD-10-CM | POA: Insufficient documentation

## 2018-02-16 DIAGNOSIS — I2510 Atherosclerotic heart disease of native coronary artery without angina pectoris: Secondary | ICD-10-CM | POA: Insufficient documentation

## 2018-02-16 DIAGNOSIS — I10 Essential (primary) hypertension: Secondary | ICD-10-CM | POA: Diagnosis not present

## 2018-02-16 DIAGNOSIS — K5641 Fecal impaction: Secondary | ICD-10-CM

## 2018-02-16 DIAGNOSIS — Z79899 Other long term (current) drug therapy: Secondary | ICD-10-CM | POA: Insufficient documentation

## 2018-02-16 DIAGNOSIS — K5900 Constipation, unspecified: Secondary | ICD-10-CM | POA: Diagnosis present

## 2018-02-16 DIAGNOSIS — K5901 Slow transit constipation: Secondary | ICD-10-CM

## 2018-02-16 DIAGNOSIS — Z951 Presence of aortocoronary bypass graft: Secondary | ICD-10-CM | POA: Diagnosis not present

## 2018-02-16 DIAGNOSIS — Z87891 Personal history of nicotine dependence: Secondary | ICD-10-CM | POA: Diagnosis not present

## 2018-02-16 MED ORDER — FLEET ENEMA 7-19 GM/118ML RE ENEM
1.0000 | ENEMA | Freq: Once | RECTAL | Status: AC
  Administered 2018-02-16: 1 via RECTAL
  Filled 2018-02-16: qty 1

## 2018-02-16 MED ORDER — MILK AND MOLASSES ENEMA
1.0000 | Freq: Once | RECTAL | Status: AC
  Administered 2018-02-16: 250 mL via RECTAL
  Filled 2018-02-16: qty 250

## 2018-02-16 NOTE — ED Provider Notes (Signed)
[facility] EMERGENCY DEPARTMENT Provider Note   CSN: [id number] Arrival date & time: 02/16/18  1327     History   Chief Complaint No chief complaint on file.   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  72yo M w/ PMH including CAD, CABG, BPH, HTN, HLD who p/w constipation.  At the beginning of this week, the patient had right inguinal hernia repair in [place].  Surgery went well and he has been recovering appropriately.  He has had intermittent, sharp severe pain near the surgical site which he has discussed with his surgeon and has been told that it may be related to an internal suture.  No swelling or drainage in this area.  Over the past several days, he has had worsening constipation despite taking MiraLAX and Colace daily and drinking lots of water.  He has also tried a suppository with no relief.  Today he is continued to have inability to defecate and now it seems to be affecting his ability to urinate.  He has had difficulty with urination but no burning or hematuria.  He has a history of prostate enlargement.  No fevers or vomiting.  The history is provided by the patient.    Past Medical History:  Diagnosis Date  . CAD, multiple vessel 04/17/12   RCA - mid 80%, distal 100% (R-R, L-R collaterals), LAD tandem prox& mid 70-80%, LCx-OM 60-70% stenoses; Preserved EF  . Enlarged prostate   . Gallstones   . Hyperlipidemia 04/17/2012  . Hypertension   . No pertinent past medical history   . NSTEMI (non-ST elevated myocardial infarction) (HCC) 04/16/12  . 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 Pre-operative clearance 02/05/2018  . Essential hypertension 02/19/2013  . 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 Anemia associated with acute blood loss, secondary to surgery 04/23/2012  . Dyslipidemia, goal LDL below 70 04/17/2012  . NSTEMI (non-ST elevated myocardial infarction)  (HCC) 04/16/2012  . Family history of coronary artery disease 04/16/2012  . Bradycardia, asymptomatic 04/16/2012    Past Surgical History:  Procedure Laterality Date  . APPENDECTOMY  1969  . CHOLECYSTECTOMY N/A 04/01/2013   Procedure: LAPAROSCOPIC CHOLECYSTECTOMY WITH INTRAOPERATIVE CHOLANGIOGRAM;  Surgeon: Emelia Loron, MD;  Location: [facility] OR;  Service: General;  Laterality: N/A;  . CORONARY ARTERY BYPASS GRAFT  04/19/2012   Procedure: CORONARY ARTERY BYPASS GRAFTING (CABG);  Surgeon: Kerin Perna, MD;  Location: [facility] OR;  Service: Open Heart Surgery;  Laterality: N/A;  coronary artery bypass graft times five using left internal mammary artery and right leg saphenous vein  . HERNIA REPAIR Left 01/2010   Left inguinal hernia repair  . INTRAOPERATIVE TRANSESOPHAGEAL ECHOCARDIOGRAM  04/19/2012   Procedure: INTRAOPERATIVE TRANSESOPHAGEAL ECHOCARDIOGRAM;  Surgeon: Kerin Perna, MD;  Location[facility] OR;  Service: Open Heart Surgery;  Laterality: N/A;  . LEFT HEART CATHETERIZATION WITH CORONARY ANGIOGRAM N/A 04/17/2012   Procedure: LEFT HEART CATHETERIZATION WITH CORONARY ANGIOGRAM;  Surgeon: Marykay Lex, MD;  Location: [facility] CATH LAB;  Service: Cardiovascular;  Laterality: N/A;        Home Medications    Prior to Admission medications   Medication Sig Start Date End Date Taking? Authorizing Provider  aspirin EC 81 MG EC tablet Take 1 tablet (81 mg total) by mouth daily. 04/24/12   Ardelle Balls, PA-C  Co-Enzyme Q-10 100 MG CAPS Take 100 mg  by mouth daily.     [provider]  COMBIGAN 0.2-0.5 % ophthalmic solution Place 1 drop into both eyes 2 (two) times daily. 08/08/17   [provider]  losartan (COZAAR) 25 MG tablet TAKE 1 TABLET (25 MG TOTAL) BY MOUTH DAILY. PLEASE KEEP UPCOMING APPOINTMENT 07/02/17   Runell Gess, MD  MEGARED OMEGA-3 KRILL OIL 500 MG CAPS Take 1 capsule by mouth daily.    [provider]  metoprolol tartrate (LOPRESSOR) 25 MG  tablet Take 0.5 tablets (12.5 mg total) by mouth 2 (two) times daily. 12/31/17   Runell Gess, MD  Multiple Vitamin (MULTIVITAMIN WITH MINERALS) TABS Take 1 tablet by mouth daily.    [provider]  OVER THE COUNTER MEDICATION Take 800 mg by mouth daily.    [provider]  OVER THE COUNTER MEDICATION Take 400 mg by mouth daily.    [provider]  rosuvastatin (CRESTOR) 10 MG tablet TAKE 1 TABLET BY MOUTH EVERY OTHER DAY 11/19/17   Runell Gess, MD    Family History Family History  Problem Relation Age of Onset  . Heart disease Father   . Cancer Sister        Breast  . Cancer Brother        Lung  . Heart attack Mother   . Brain cancer Brother        tumor  . Heart attack Brother   . CAD Brother   . CAD Brother   . CAD Sister     Social History Social History   Tobacco Use  . Smoking status: Former Smoker    Packs/day: 0.25    Years: 1.00    Pack years: 0.25    Types: Cigarettes    Last attempt to quit: 04/20/1967    Years since quitting: 50.8  . Smokeless tobacco: Never Used  Substance Use Topics  . Alcohol use: No  . Drug use: No  Retired physician   Allergies   Ace inhibitors; Lisinopril; and Oxycodone   Review of Systems Review of Systems All other systems reviewed and are negative except that which was mentioned in HPI   Physical Exam Updated Vital Signs BP (!) 175/84 (BP Location: Left Arm)   Pulse 61   Temp 97.6 F (36.4 C) (Oral)   Resp 16   SpO2 100%   Physical Exam  Constitutional: He is oriented to person, place, and time. He appears well-developed and well-nourished. No distress.  HENT:  Head: Normocephalic and atraumatic.  Eyes: Conjunctivae are normal.  Neck: Neck supple.  Pulmonary/Chest: Effort normal.  Abdominal: Soft. Bowel sounds are normal. He exhibits no distension. There is no tenderness.  Genitourinary: Penis normal.  Genitourinary Comments: Bruising and mild edema of R inguinal area with  surgical site clean/dry/intact, glue in place; no focal tenderness + fecal impaction with hard stool  Musculoskeletal: He exhibits no tenderness.  Neurological: He is alert and oriented to person, place, and time.  Normal gait  Skin: Skin is warm and dry. No rash noted.  Psychiatric: He has a normal mood and affect. Judgment normal.  Nursing note and vitals reviewed.    ED Treatments / Results  Labs (all labs ordered are listed, but only abnormal results are displayed) Labs Reviewed - No data to display  EKG None  Radiology No results found.  Procedures Fecal disimpaction Date/Time: 02/16/2018 3:41 PM Performed by: Laurence Spates, MD Authorized by: Laurence Spates, MD  Consent: Verbal consent obtained.  Risks and benefits: risks, benefits and alternatives were discussed Patient identity confirmed: verbally with patient  Sedation: Patient sedated: no  Patient tolerance: Patient tolerated the procedure well with no immediate complications Comments: Several hard stool balls removed from rectum    (including critical care time) Chaperone was present during exam.  Medications Ordered in ED Medications  sodium phosphate (FLEET) 7-19 GM/118ML enema 1 enema (has no administration in time range)     Initial Impression / Assessment and Plan / ED Course  I have reviewed the triage vital signs and the nursing notes.     Pt was able to urinate in ED without foley. Gave fleets enema, unable to pass significant stool. Manually disimpacted, which passed some stool but a large amount of impacted stool remains. I have ordered a milk and molasses enema. Signing out to oncoming team pending clinical improvement. I anticipate discharge.  Final Clinical Impressions(s) / ED Diagnoses   Final diagnoses:  None    ED Discharge Orders    None       Kedric Bumgarner, Ambrose Finland, MD 02/16/18 [phone]

## 2018-02-16 NOTE — Discharge Instructions (Signed)
Take 2-4 capfuls miralax daily as needed for constipation. Take colace daily. If continued constipation, add senna 1 tablet at night. For severe constipation, take a bottle of magnesium citrate.

## 2018-02-16 NOTE — ED Notes (Signed)
Bedside commode at bedside.

## 2018-02-16 NOTE — ED Notes (Signed)
Pt had large bowel movement and said he was ready to be discharged. RN aware

## 2018-02-16 NOTE — ED Triage Notes (Signed)
Patient reports hernia repair in [place] earlier this week. Now experiencing constipation and urinary retention. taking otc meds. With no relief. Alert and oriented

## 2018-02-16 NOTE — ED Notes (Signed)
Patient up to bathroom with no obvious signs of distress.

## 2018-06-27 ENCOUNTER — Other Ambulatory Visit: Payer: Self-pay | Admitting: Cardiovascular Disease

## 2018-09-03 ENCOUNTER — Telehealth: Payer: Self-pay | Admitting: *Deleted

## 2018-09-03 NOTE — Telephone Encounter (Signed)
Spoke with patient and he said that he would rather see Dr. Allyson Sabal in person verses having a virtual office visit. We rescheduled patient's appointment from September 04, 2018 to January 07, 2019 at 2:00 pm.

## 2018-09-04 ENCOUNTER — Ambulatory Visit: Payer: Medicare Other | Admitting: Cardiovascular Disease

## 2018-09-18 ENCOUNTER — Emergency Department (HOSPITAL_COMMUNITY)
Admission: EM | Admit: 2018-09-18 | Discharge: 2018-09-18 | Disposition: A | Discharge status: Home / Self Care | Payer: Medicare Other | Attending: Emergency Medicine | Admitting: Emergency Medicine

## 2018-09-18 ENCOUNTER — Emergency Department (HOSPITAL_COMMUNITY): Payer: Medicare Other

## 2018-09-18 ENCOUNTER — Encounter (HOSPITAL_COMMUNITY): Payer: Self-pay | Admitting: Emergency Medicine

## 2018-09-18 DIAGNOSIS — R112 Nausea with vomiting, unspecified: Secondary | ICD-10-CM | POA: Diagnosis not present

## 2018-09-18 DIAGNOSIS — N189 Chronic kidney disease, unspecified: Secondary | ICD-10-CM

## 2018-09-18 DIAGNOSIS — R1031 Right lower quadrant pain: Secondary | ICD-10-CM | POA: Diagnosis present

## 2018-09-18 DIAGNOSIS — Z87891 Personal history of nicotine dependence: Secondary | ICD-10-CM | POA: Diagnosis not present

## 2018-09-18 DIAGNOSIS — Z7982 Long term (current) use of aspirin: Secondary | ICD-10-CM | POA: Insufficient documentation

## 2018-09-18 DIAGNOSIS — N201 Calculus of ureter: Secondary | ICD-10-CM | POA: Diagnosis not present

## 2018-09-18 DIAGNOSIS — Z79899 Other long term (current) drug therapy: Secondary | ICD-10-CM | POA: Insufficient documentation

## 2018-09-18 DIAGNOSIS — Z951 Presence of aortocoronary bypass graft: Secondary | ICD-10-CM | POA: Diagnosis not present

## 2018-09-18 DIAGNOSIS — I10 Essential (primary) hypertension: Secondary | ICD-10-CM | POA: Insufficient documentation

## 2018-09-18 DIAGNOSIS — I252 Old myocardial infarction: Secondary | ICD-10-CM | POA: Insufficient documentation

## 2018-09-18 DIAGNOSIS — N1330 Unspecified hydronephrosis: Secondary | ICD-10-CM | POA: Insufficient documentation

## 2018-09-18 DIAGNOSIS — I2510 Atherosclerotic heart disease of native coronary artery without angina pectoris: Secondary | ICD-10-CM | POA: Diagnosis not present

## 2018-09-18 DIAGNOSIS — N200 Calculus of kidney: Secondary | ICD-10-CM

## 2018-09-18 DIAGNOSIS — R109 Unspecified abdominal pain: Secondary | ICD-10-CM

## 2018-09-18 LAB — URINALYSIS, ROUTINE W REFLEX MICROSCOPIC
Bilirubin Urine: NEGATIVE
Glucose, UA: NEGATIVE mg/dL
Ketones, ur: 20 mg/dL — AB
Leukocytes,Ua: NEGATIVE
Nitrite: NEGATIVE
Protein, ur: 100 mg/dL — AB
Specific Gravity, Urine: 1.026 (ref 1.005–1.030)
pH: 7 (ref 5.0–8.0)

## 2018-09-18 LAB — CBC WITH DIFFERENTIAL/PLATELET
Abs Immature Granulocytes: 0.01 10*3/uL (ref 0.00–0.07)
Basophils Absolute: 0 10*3/uL (ref 0.0–0.1)
Basophils Relative: 0 %
Eosinophils Absolute: 0.1 10*3/uL (ref 0.0–0.5)
Eosinophils Relative: 1 %
HCT: 42.7 % (ref 39.0–52.0)
Hemoglobin: 13.9 g/dL (ref 13.0–17.0)
Immature Granulocytes: 0 %
Lymphocytes Relative: 9 %
Lymphs Abs: 0.5 10*3/uL — ABNORMAL LOW (ref 0.7–4.0)
MCH: 27.4 pg (ref 26.0–34.0)
MCHC: 32.6 g/dL (ref 30.0–36.0)
MCV: 84.2 fL (ref 80.0–100.0)
Monocytes Absolute: 0.4 10*3/uL (ref 0.1–1.0)
Monocytes Relative: 8 %
Neutro Abs: 4.3 10*3/uL (ref 1.7–7.7)
Neutrophils Relative %: 82 %
Platelets: 143 10*3/uL — ABNORMAL LOW (ref 150–400)
RBC: 5.07 MIL/uL (ref 4.22–5.81)
RDW: 12.9 % (ref 11.5–15.5)
WBC: 5.2 10*3/uL (ref 4.0–10.5)
nRBC: 0 % (ref 0.0–0.2)

## 2018-09-18 LAB — COMPREHENSIVE METABOLIC PANEL
ALT: 20 U/L (ref 0–44)
AST: 27 U/L (ref 15–41)
Albumin: 4.1 g/dL (ref 3.5–5.0)
Alkaline Phosphatase: 39 U/L (ref 38–126)
Anion gap: 12 (ref 5–15)
BUN: 18 mg/dL (ref 8–23)
CO2: 22 mmol/L (ref 22–32)
Calcium: 9.5 mg/dL (ref 8.9–10.3)
Chloride: 106 mmol/L (ref 98–111)
Creatinine, Ser: 1.36 mg/dL — ABNORMAL HIGH (ref 0.61–1.24)
GFR calc Af Amer: 59 mL/min — ABNORMAL LOW (ref >60)
GFR calc non Af Amer: 51 mL/min — ABNORMAL LOW (ref >60)
Glucose, Bld: 154 mg/dL — ABNORMAL HIGH (ref 70–99)
Potassium: 4.4 mmol/L (ref 3.5–5.1)
Sodium: 140 mmol/L (ref 135–145)
Total Bilirubin: 1.7 mg/dL — ABNORMAL HIGH (ref 0.3–1.2)
Total Protein: 6.5 g/dL (ref 6.5–8.1)

## 2018-09-18 MED ORDER — ONDANSETRON HCL 4 MG/2ML IJ SOLN
4.0000 mg | Freq: Once | INTRAMUSCULAR | Status: AC
  Administered 2018-09-18: 11:00:00 4 mg via INTRAVENOUS
  Filled 2018-09-18: qty 2

## 2018-09-18 MED ORDER — MORPHINE SULFATE (PF) 2 MG/ML IV SOLN
2.0000 mg | Freq: Once | INTRAVENOUS | Status: AC
  Administered 2018-09-18: 11:00:00 2 mg via INTRAVENOUS
  Filled 2018-09-18: qty 1

## 2018-09-18 NOTE — Discharge Instructions (Addendum)
Drink plenty of fluids The stone should pass since it is only 52mm but please f/u with Urology if you are not improving Return if symptoms are worsening

## 2018-09-18 NOTE — ED Notes (Signed)
PA at bedside.

## 2018-09-18 NOTE — ED Notes (Signed)
Patient transported to CT 

## 2018-09-18 NOTE — ED Provider Notes (Signed)
[facility] EMERGENCY DEPARTMENT Provider Note   CSN: [id number] Arrival date & time: 09/18/18  5643    History   Chief Complaint Chief Complaint  Patient presents with  . 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 PMH significant for CAD s/p CABG, HTN, HLD, enlarged prostate, glaucoma. He states that around 2AM he developed an acute onset of R flank pain. It radiates from the back to the R side. It is intermittent and sharp. He reports associated vomiting. He was very uncomfortable until around 5AM so decided to come to the ED. When he got here the pain completely resolved so he went home and back to sleep. When he woke up the pain returned so came to the ED again. He denies hx of kidney stone. He reports past surgical hx of cholecystectomy and appendectomy and R inguinal hernia repair. No fever, chills, chest pain, SOB, current N/V, diarrhea, constipation, dysuria, difficulty urinating, hematuria.      HPI  Past Medical History:  Diagnosis Date  . CAD, multiple vessel 04/17/12   RCA - mid 80%, distal 100% (R-R, L-R collaterals), LAD tandem prox& mid 70-80%, LCx-OM 60-70% stenoses; Preserved EF  . Enlarged prostate   . Gallstones   . Hyperlipidemia 04/17/2012  . Hypertension   . No pertinent past medical history   . NSTEMI (non-ST elevated myocardial infarction) (HCC) 04/16/12  . 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 Pre-operative clearance 02/05/2018  . Essential hypertension 02/19/2013  . 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 Anemia associated with acute blood loss, secondary to surgery 04/23/2012  . Dyslipidemia, goal LDL below 70 04/17/2012  . NSTEMI (non-ST elevated myocardial infarction) (HCC) 04/16/2012  . Family  history of coronary artery disease 04/16/2012  . Bradycardia, asymptomatic 04/16/2012    Past Surgical History:  Procedure Laterality Date  . APPENDECTOMY  1969  . CHOLECYSTECTOMY N/A 04/01/2013   Procedure: LAPAROSCOPIC CHOLECYSTECTOMY WITH INTRAOPERATIVE CHOLANGIOGRAM;  Surgeon: Emelia Loron, MD;  Location: [facility] OR;  Service: General;  Laterality: N/A;  . CORONARY ARTERY BYPASS GRAFT  04/19/2012   Procedure: CORONARY ARTERY BYPASS GRAFTING (CABG);  Surgeon: Kerin Perna, MD;  Location: [facility] OR;  Service: Open Heart Surgery;  Laterality: N/A;  coronary artery bypass graft times five using left internal mammary artery and right leg saphenous vein  . HERNIA REPAIR Left 01/2010   Left inguinal hernia repair  . INTRAOPERATIVE TRANSESOPHAGEAL ECHOCARDIOGRAM  04/19/2012   Procedure: INTRAOPERATIVE TRANSESOPHAGEAL ECHOCARDIOGRAM;  Surgeon: Kerin Perna, MD;  Location: [facility] OR;  Service: Open Heart Surgery;  Laterality: N/A;  . LEFT HEART CATHETERIZATION WITH CORONARY ANGIOGRAM N/A 04/17/2012   Procedure: LEFT HEART CATHETERIZATION WITH CORONARY ANGIOGRAM;  Surgeon: Marykay Lex, MD;  Location: [facility] CATH LAB;  Service: Cardiovascular;  Laterality: N/A;        Home Medications    Prior to Admission medications   Medication Sig Start Date End Date Taking? Authorizing Provider  aspirin EC 81 MG EC tablet Take 1 tablet (81 mg total) by mouth daily. 04/24/12   Ardelle Balls, PA-C  Co-Enzyme Q-10 100 MG CAPS Take 100 mg by mouth daily.     [provider]  COMBIGAN 0.2-0.5 % ophthalmic solution Place 1 drop into both eyes 2 (two) times daily. 08/08/17   [provider]  losartan (COZAAR) 25 MG tablet TAKE 1 TABLET (25 MG TOTAL) BY MOUTH DAILY. PLEASE KEEP UPCOMING APPOINTMENT 06/27/18   Runell Gess, MD  MEGARED OMEGA-3 KRILL OIL 500 MG CAPS Take 1 capsule by mouth daily.    [provider]  metoprolol tartrate (LOPRESSOR) 25 MG tablet Take 0.5 tablets (12.5  mg total) by mouth 2 (two) times daily. 12/31/17   Runell Gess, MD  Multiple Vitamin (MULTIVITAMIN WITH MINERALS) TABS Take 1 tablet by mouth daily.    [provider]  OVER THE COUNTER MEDICATION Take 800 mg by mouth daily.    [provider]  OVER THE COUNTER MEDICATION Take 400 mg by mouth daily.    [provider]  rosuvastatin (CRESTOR) 10 MG tablet TAKE 1 TABLET BY MOUTH EVERY OTHER DAY 11/19/17   Runell Gess, MD    Family History Family History  Problem Relation Age of Onset  . Heart disease Father   . Cancer Sister        Breast  . Cancer Brother        Lung  . Heart attack Mother   . Brain cancer Brother        tumor  . Heart attack Brother   . CAD Brother   . CAD Brother   . CAD Sister     Social History Social History   Tobacco Use  . Smoking status: Former Smoker    Packs/day: 0.25    Years: 1.00    Pack years: 0.25    Types: Cigarettes    Last attempt to quit: 04/20/1967    Years since quitting: 51.4  . Smokeless tobacco: Never Used  Substance Use Topics  . Alcohol use: No  . Drug use: No     Allergies   Ace inhibitors; Lisinopril; and Oxycodone   Review of Systems Review of Systems  Constitutional: Negative for chills and fever.  Respiratory: Negative for shortness of breath.   Cardiovascular: Negative for chest pain.  Gastrointestinal: Positive for abdominal pain, nausea and vomiting.  Genitourinary: Positive for flank pain. Negative for difficulty urinating, penile pain and testicular pain.  All other systems reviewed and are negative.    Physical Exam Updated Vital Signs BP (!) 181/73   Pulse (!) 48   Temp 98.1 F (36.7 C)   Resp 18   SpO2 100%   Physical Exam Vitals signs and nursing note reviewed.  Constitutional:      General: He is not in acute distress.    Appearance: Normal appearance. He is well-developed. He is not ill-appearing.  HENT:     Head: Normocephalic and atraumatic.  Eyes:      General: No scleral icterus.       Right eye: No discharge.        Left eye: No discharge.     Conjunctiva/sclera: Conjunctivae normal.     Pupils: Pupils are equal, round, and reactive to light.  Neck:     Musculoskeletal: Normal range of motion.  Cardiovascular:     Rate and Rhythm: Normal rate and regular rhythm.  Pulmonary:     Effort: Pulmonary effort is normal. No respiratory distress.     Breath sounds: Normal breath sounds.  Abdominal:     General: There is no distension.     Palpations: Abdomen is soft.     Tenderness: There  is abdominal tenderness. There is right CVA tenderness. There is no left CVA tenderness.  Skin:    General: Skin is warm and dry.  Neurological:     Mental Status: He is alert and oriented to person, place, and time.  Psychiatric:        Behavior: Behavior normal.      ED Treatments / Results  Labs (all labs ordered are listed, but only abnormal results are displayed) Labs Reviewed  CBC WITH DIFFERENTIAL/PLATELET - Abnormal; Notable for the following components:      Result Value   Platelets 143 (*)    Lymphs Abs 0.5 (*)    All other components within normal limits  COMPREHENSIVE METABOLIC PANEL - Abnormal; Notable for the following components:   Glucose, Bld 154 (*)    Creatinine, Ser 1.36 (*)    Total Bilirubin 1.7 (*)    GFR calc non Af Amer 51 (*)    GFR calc Af Amer 59 (*)    All other components within normal limits  URINALYSIS, ROUTINE W REFLEX MICROSCOPIC    EKG None  Radiology Ct Renal Stone Study  Result Date: 09/18/2018 CLINICAL DATA:  Left flank pain today. EXAM: CT ABDOMEN AND PELVIS WITHOUT CONTRAST TECHNIQUE: Multidetector CT imaging of the abdomen and pelvis was performed following the standard protocol without IV contrast. COMPARISON:  Abdominal MRI 11/05/2012. FINDINGS: Lower chest: Lung bases clear. No pleural or pericardial effusion. 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 Hepatobiliary: No focal liver  abnormality is seen. Status post cholecystectomy. No biliary dilatation. Pancreas: Unremarkable. No pancreatic ductal dilatation or surrounding inflammatory changes. Spleen: Normal in size without focal abnormality. Adrenals/Urinary Tract: There is mild right hydronephrosis due to a 0.2 cm stone in the mid right ureter at the level of L4-5. No other stones are identified. Scattered very small hyperattenuating lesions in both kidneys are likely cysts. The appearance is not grossly changed compared to the prior MRI. Urinary bladder appears normal. Mild thickening of the adrenal glands is compatible with hyperplasia. Stomach/Bowel: Stomach is within normal limits. Status post appendectomy. No evidence of bowel wall thickening, distention, or inflammatory changes. Vascular/Lymphatic: Aortic atherosclerosis. No enlarged abdominal or pelvic lymph nodes. Reproductive: Moderate prostatomegaly. Other: Very small fat containing umbilical hernia noted. Musculoskeletal: No acute or focal abnormality. Degenerative disc disease L2-3 is identified. IMPRESSION: Mild right hydronephrosis due to a 0.2 cm right ureteral stone. No other acute abnormality. Atherosclerosis. Prostatomegaly. Electronically Signed   By: Drusilla Kannerhomas  Dalessio M.D.   On: 09/18/2018 11:15    Procedures Procedures (including critical care time)  Medications Ordered in ED Medications  morphine 2 MG/ML injection 2 mg (2 mg Intravenous Given 09/18/18 1052)  ondansetron (ZOFRAN) injection 4 mg (4 mg Intravenous Given 09/18/18 1052)     Initial Impression / Assessment and Plan / ED Course  I have reviewed the triage vital signs and the nursing notes.  Pertinent labs & imaging results that were available during my care of the patient were reviewed by me and considered in my medical decision making (see chart for details).  73 year old male presents with acute R flank pain since this morning. He has had his gallbladder and appendix out already. Pain is coming  and going and severe in nature. Suspect kidney stone. He has also been having some MSK issues so could be due to that. Will obtain labs, UA, CT renal. He was given morphine and Zofran for pain control. Shared visit with Dr. Pilar PlateBero  11:44 AM  CT shows 2mm stone in the mid-right ureter with mild hydro. CBC is normal. CMP is remarkable for SCr of 1.36 which is slightly elevated from baseline (1-1.2) and mild hyperglycemia (154). UA is pending.    UA does not show infection. Pt is declining any d/c meds. Advised f/u with urology or return if worsening. Strainer was given.    Final Clinical Impressions(s) / ED Diagnoses   Final diagnoses:  Right flank pain  Kidney stone  Chronic kidney disease, unspecified CKD stage    ED Discharge Orders    None       Bethel Born, PA-C 09/18/18 1550    Sabas Sous, MD 09/20/18 1114

## 2018-10-06 ENCOUNTER — Other Ambulatory Visit: Payer: Self-pay | Admitting: Cardiovascular Disease

## 2018-10-08 NOTE — Telephone Encounter (Signed)
Rx request sent to pharmacy.  

## 2018-12-19 ENCOUNTER — Other Ambulatory Visit: Payer: Self-pay | Admitting: Cardiovascular Disease

## 2019-01-07 ENCOUNTER — Ambulatory Visit (INDEPENDENT_AMBULATORY_CARE_PROVIDER_SITE_OTHER): Payer: Medicare Other | Admitting: Cardiovascular Disease

## 2019-01-07 ENCOUNTER — Other Ambulatory Visit: Payer: Self-pay

## 2019-01-07 ENCOUNTER — Encounter: Payer: Self-pay | Admitting: Cardiovascular Disease

## 2019-01-07 DIAGNOSIS — I10 Essential (primary) hypertension: Secondary | ICD-10-CM | POA: Diagnosis not present

## 2019-01-07 DIAGNOSIS — E785 Hyperlipidemia, unspecified: Secondary | ICD-10-CM | POA: Diagnosis not present

## 2019-01-07 DIAGNOSIS — Z951 Presence of aortocoronary bypass graft: Secondary | ICD-10-CM | POA: Diagnosis not present

## 2019-01-07 NOTE — Patient Instructions (Signed)
Medication Instructions:  Your physician recommends that you continue on your current medications as directed. Please refer to the Current Medication list given to you today.  If you need a refill on your cardiac medications before your next appointment, please call your pharmacy.   Lab work: Your physician recommends that you return for lab work within 1 week: [place]  If you have labs (blood work) drawn today and your tests are completely normal, you will receive your results only by: Marland Kitchen MyChart Message (if you have MyChart) OR . A paper copy in the mail If you have any lab test that is abnormal or we need to change your treatment, we will call you to review the results.  Testing/Procedures: NONE  Follow-Up: At [facility], you and your health needs are our priority.  As part of our continuing mission to provide you with exceptional heart care, we have created designated Provider Care Teams.  These Care Teams include your primary Cardiologist (physician) and Advanced Practice Providers (APPs -  Physician Assistants and Nurse Practitioners) who all work together to provide you with the care you need, when you need it. You will need a follow up appointment in 12 months with Dr. Quay Burow.  Please call our office 2 months in advance to schedule this/each appointment.

## 2019-01-07 NOTE — Progress Notes (Signed)
01/07/2019 Tyler Setaharles E Orourke, MD   12/17/1945  [id number]  Primary Physician Eartha InchBadger, Michael C, MD Primary Cardiologist: Runell GessJonathan J Mozella Rexrode MD Milagros LollFACP, FACC, LydiaFAHA, [place]  HPI:  Tyler Setaharles E Gadsden, MD is a 73 y.o.    thin and fit-appearing, married [place] male, father of 2, grandfather to 1 grandchild who I last saw in the office 08/14/2017.Marland Kitchen.  He retired from Advice workerthe practice of medicine age 73... He [facility] 16, [facility], with a non-ST-segment-elevation myocardial infarction. He was catheterized by Dr. Bryan Lemmaavid Harding radially on April 17, 2012, revealing 2-vessel disease. His EF was normal with mild interior hypokinesia. He underwent coronary artery bypass grafting x5 by Dr. Kathlee NationsPeter Van Trigt April 19, 2012, with a LIMA to his LAD, a vein to his diagonal branch, a vein to an obtuse marginal branch and distal circumflex. There was also a vein to a PDA. His postop course was uncomplicated. He [facility] 07, [facility], with delirium and was found to have a UTI. Cultures revealed Klebsiella pneumoniae. He was treated with Keflex and subsequently improved.I saw him in the office 02/19/13. He has done well since. 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  Since I saw him over a year ago he did [place] used a "non-mesh technique".  He otherwise is fairly active and exercises frequently and is asymptomatic.   Current Meds  Medication Sig  . aspirin EC 81 MG EC tablet Take 1 tablet (81 mg total) by mouth daily.  . cholecalciferol (VITAMIN D) 25 MCG (1000 UT) tablet Take 1,000 Units by mouth daily.   Marland Kitchen. Co-Enzyme Q-10 100 MG CAPS Take 100 mg by mouth daily.   . COMBIGAN 0.2-0.5 % ophthalmic solution Place 1 drop into both eyes 2 (two) times daily.  . dorzolamide (TRUSOPT) 2 % ophthalmic solution INSTILL 1 DROP INTO LEFT EYE TWICE A DAY  . losartan (COZAAR) 25 MG tablet TAKE 1 TABLET (25 MG TOTAL) BY MOUTH DAILY. PLEASE KEEP UPCOMING APPOINTMENT  . MEGARED OMEGA-3 KRILL OIL 500 MG CAPS Take 1 capsule by mouth daily.  . metoprolol tartrate (LOPRESSOR) 25 MG tablet TAKE 0.5 TABLETS (12.5 MG TOTAL) BY MOUTH 2 (TWO) TIMES DAILY.  . Multiple Vitamin (MULTIVITAMIN WITH MINERALS) TABS Take 1 tablet by mouth daily. alive  . RHOPRESSA 0.02 % SOLN PLACE 1 DROP INTO THE LEFT EYE NIGHTLY  . rosuvastatin (CRESTOR) 10 MG tablet TAKE 1 TABLET BY MOUTH EVERY OTHER DAY (Patient taking differently: Take 10 mg by mouth daily. )  . tamsulosin (FLOMAX) 0.4 MG CAPS capsule TAKE 1 CAPSULE BY MOUTH EVERYDAY AT BEDTIME     Allergies  Allergen Reactions  . Ace Inhibitors Other (See Comments)    cough  . Lisinopril Cough  . Oxycodone     Hallucinations    Social History   Socioeconomic History  . Marital status: Married    Spouse name: Not on file  . Number of children: Not on file  . Years of education: Not on file  . Highest education level: Not on file  Occupational History  . Not on file  Social Needs  . Financial resource strain: Not on file  . Food insecurity    Worry: Not on file    Inability: Not on file  . Transportation needs    Medical: Not on file    Non-medical: Not on file  Tobacco Use  . Smoking status: Former Smoker    Packs/day: 0.25    Years: 1.00    Pack years: 0.25    Types: Cigarettes    Quit date: 04/20/1967    Years since quitting: 51.7  . Smokeless tobacco: Never Used  Substance and Sexual Activity  . Alcohol use: No  . Drug use: No  . Sexual activity: Not on file  Lifestyle  . Physical activity    Days per week: Not on file    Minutes per session: Not on file   . Stress: Not on file  Relationships  . Social Musician on phone: Not on file    Gets together: Not on file    Attends religious service: Not on file    Active member of club or organization: Not on file    Attends meetings of clubs or organizations: Not on file    Relationship status: Not on file  . Intimate partner violence    Fear of current or ex partner: Not on file    Emotionally abused: Not on file    Physically abused: Not on file    Forced sexual activity: Not on file  Other Topics Concern  . Not on file  Social History Narrative  . Not on file     Review of Systems: General: negative for chills, fever, night sweats or weight changes.  Cardiovascular: negative for chest pain, dyspnea on exertion, edema, orthopnea, palpitations, paroxysmal nocturnal dyspnea or shortness of breath Dermatological: negative for rash Respiratory: negative for cough or wheezing Urologic: negative for hematuria Abdominal: negative for nausea, vomiting, diarrhea, bright red blood per rectum, melena, or hematemesis Neurologic: negative for visual changes, syncope, or dizziness All other systems reviewed and are otherwise negative except as noted above.    Blood pressure 138/72, pulse (!) 51, temperature 98.1 F (36.7 C), height 5\' 11"  (1.803 m), weight 178 lb 1.6 oz (80.8 kg), SpO2 98 %.  General appearance: alert and no distress Neck: no adenopathy, no carotid bruit, no JVD, supple, symmetrical, trachea midline and thyroid not enlarged, symmetric, no tenderness/mass/nodules Lungs: clear to auscultation bilaterally Heart: regular rate and rhythm, S1, S2 normal, no murmur, click, rub or gallop Extremities: extremities normal, atraumatic, no cyanosis or edema Pulses: 2+ and symmetric Skin: Skin color, texture, turgor normal. No rashes or lesions Neurologic: Alert and oriented X 3, normal strength and tone. Normal symmetric reflexes. Normal coordination and gait  EKG sinus  bradycardia at 50 without ST or T wave changes.  I personally reviewed this EKG.  ASSESSMENT AND PLAN:   Dyslipidemia, goal LDL below 70 History of hyperlipidemia on statin therapy with lipid profile performed 08/10/2017 revealing total cholesterol 149, LDL of 80 and HDL 54.  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  He had a LIMA to his LAD, vein to diagonal branch, obtuse marginal branch and PDA.  Unfortunately he had a paralyzed left hemidiaphragm that did resolve after several months.  He walks frequently and is asymptomatic.  Essential hypertension History of essential hypertension blood pressure measured today 138/72.  He is  on losartan and metoprolol.      Lorretta Harp MD FACP,FACC,FAHA, [facility] 01/07/2019 2:32 PM

## 2019-01-07 NOTE — Assessment & Plan Note (Signed)
History of CAD status post CABG times 18 April 2012 after a non-STEMI.  He had a LIMA to his LAD, vein to diagonal branch, obtuse marginal branch and PDA.  Unfortunately he had a paralyzed left hemidiaphragm that did resolve after several months.  He walks frequently and is asymptomatic.

## 2019-01-07 NOTE — Assessment & Plan Note (Signed)
History of essential hypertension blood pressure measured today 138/72.  He is on losartan and metoprolol.

## 2019-01-07 NOTE — Assessment & Plan Note (Signed)
History of hyperlipidemia on statin therapy with lipid profile performed 08/10/2017 revealing total cholesterol 149, LDL of 80 and HDL 54.  We will recheck a lipid liver profile

## 2019-01-08 ENCOUNTER — Other Ambulatory Visit: Payer: Self-pay | Admitting: *Deleted

## 2019-01-08 DIAGNOSIS — I10 Essential (primary) hypertension: Secondary | ICD-10-CM

## 2019-01-08 DIAGNOSIS — E785 Hyperlipidemia, unspecified: Secondary | ICD-10-CM

## 2019-01-08 LAB — HEPATIC FUNCTION PANEL
ALT: 16 IU/L (ref 0–44)
AST: 24 IU/L (ref 0–40)
Albumin: 4.3 g/dL (ref 3.7–4.7)
Alkaline Phosphatase: 48 IU/L (ref 39–117)
Bilirubin Total: 1.4 mg/dL — ABNORMAL HIGH (ref 0.0–1.2)
Bilirubin, Direct: 0.33 mg/dL (ref 0.00–0.40)
Total Protein: 6.3 g/dL (ref 6.0–8.5)

## 2019-01-08 LAB — BASIC METABOLIC PANEL
BUN/Creatinine Ratio: 11 (ref 10–24)
BUN: 13 mg/dL (ref 8–27)
CO2: 23 mmol/L (ref 20–29)
Calcium: 9.5 mg/dL (ref 8.6–10.2)
Chloride: 103 mmol/L (ref 96–106)
Creatinine, Ser: 1.23 mg/dL (ref 0.76–1.27)
GFR calc Af Amer: 66 mL/min/{1.73_m2} (ref >59)
GFR calc non Af Amer: 57 mL/min/{1.73_m2} — ABNORMAL LOW (ref >59)
Glucose: 84 mg/dL (ref 65–99)
Potassium: 4.4 mmol/L (ref 3.5–5.2)
Sodium: 141 mmol/L (ref 134–144)

## 2019-01-08 LAB — LIPID PANEL
Chol/HDL Ratio: 2.7 ratio (ref 0.0–5.0)
Cholesterol, Total: 134 mg/dL (ref 100–199)
HDL: 50 mg/dL (ref >39)
LDL Calculated: 70 mg/dL (ref 0–99)
Triglycerides: 71 mg/dL (ref 0–149)
VLDL Cholesterol Cal: 14 mg/dL (ref 5–40)

## 2019-01-09 ENCOUNTER — Encounter: Payer: Self-pay | Admitting: *Deleted

## 2019-01-10 ENCOUNTER — Other Ambulatory Visit: Payer: Self-pay | Admitting: Cardiovascular Disease

## 2019-02-01 ENCOUNTER — Other Ambulatory Visit: Payer: Self-pay | Admitting: Cardiovascular Disease

## 2019-02-07 ENCOUNTER — Other Ambulatory Visit: Payer: Self-pay | Admitting: Cardiovascular Disease

## 2019-12-20 ENCOUNTER — Other Ambulatory Visit: Payer: Self-pay | Admitting: Urology

## 2020-01-12 ENCOUNTER — Other Ambulatory Visit: Payer: Self-pay | Admitting: Cardiovascular Disease

## 2020-02-06 ENCOUNTER — Other Ambulatory Visit: Payer: Self-pay | Admitting: Cardiovascular Disease

## 2020-02-14 ENCOUNTER — Other Ambulatory Visit: Payer: Self-pay | Admitting: Cardiovascular Disease

## 2020-02-18 ENCOUNTER — Other Ambulatory Visit: Payer: Self-pay

## 2020-02-18 ENCOUNTER — Other Ambulatory Visit: Payer: Self-pay | Admitting: Cardiovascular Disease

## 2020-02-18 MED ORDER — METOPROLOL TARTRATE 25 MG PO TABS: ORAL_TABLET | ORAL | 2 refills | Status: DC

## 2020-02-18 NOTE — Telephone Encounter (Signed)
Please contact pt for future 12 month f/u pt overdue for yearly.

## 2020-03-11 ENCOUNTER — Other Ambulatory Visit: Payer: Self-pay | Admitting: Cardiovascular Disease

## 2020-04-11 ENCOUNTER — Other Ambulatory Visit: Payer: Self-pay | Admitting: Cardiovascular Disease

## 2020-04-21 ENCOUNTER — Encounter: Payer: Self-pay | Admitting: Cardiovascular Disease

## 2020-04-21 ENCOUNTER — Other Ambulatory Visit: Payer: Self-pay

## 2020-04-21 ENCOUNTER — Ambulatory Visit (INDEPENDENT_AMBULATORY_CARE_PROVIDER_SITE_OTHER): Payer: Medicare PPO | Admitting: Cardiovascular Disease

## 2020-04-21 VITALS — BP 122/70 | HR 51 | Ht 71.0 in | Wt 182.0 lb

## 2020-04-21 DIAGNOSIS — I10 Essential (primary) hypertension: Secondary | ICD-10-CM

## 2020-04-21 DIAGNOSIS — Z951 Presence of aortocoronary bypass graft: Secondary | ICD-10-CM

## 2020-04-21 DIAGNOSIS — E785 Hyperlipidemia, unspecified: Secondary | ICD-10-CM | POA: Diagnosis not present

## 2020-04-21 NOTE — Assessment & Plan Note (Signed)
History of CAD status post non-STEMI with cath by Dr. Herbie Baltimore 04/17/2012 revealing three-vessel disease.  Ultimately underwent CABG x5 by Dr. Maren Beach on 08/19/2011 with a LIMA to his LAD, vein to diagonal branch, obtuse marginal branch and distal circumflex as well as PDA.  He is done well since.  He denies chest pain or shortness of breath.

## 2020-04-21 NOTE — Assessment & Plan Note (Signed)
History of essential hypertension a blood pressure measured today 122/70.  He is on losartan and metoprolol.

## 2020-04-21 NOTE — Patient Instructions (Signed)

## 2020-04-21 NOTE — Progress Notes (Signed)
04/21/2020 Tyler Seta, MD   February 12, 1946  [id number]  Primary Physician Eartha Inch, MD Primary Cardiologist: Runell Gess MD Nicholes Calamity, [place]  HPI:  Tyler Seta, MD is a 74 y.o.  thin and fit-appearing, married [place] American male, father of 2, grandfather to 1 grandchild who I last saw in the office 01/07/2019.Marland Kitchen  He retired from Advice worker of medicine age 13... He presented to [facility] April 16, 2012, with a non-ST-segment-elevation myocardial infarction. He was catheterized by Dr. Bryan Lemma radially on April 17, 2012, revealing 2-vessel disease. His EF was normal with mild interior hypokinesia. He underwent coronary artery bypass grafting x5 by Dr. Kathlee Nations Trigt April 19, 2012, with a LIMA to his LAD, a vein to his diagonal branch, a vein to an obtuse marginal branch and distal circumflex. There was also a vein to a PDA. His postop course was uncomplicated. He was seen at [facility] May 07, 2012, with delirium and was found to have a UTI. Cultures revealed Klebsiella pneumoniae. He was treated with Keflex and subsequently improved.I saw him in the office 02/19/13. 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  Since I saw him over a year ago he continues to do well.  He walks 3 miles a day several days a week without limitation.  He denies chest pain or shortness of breath.   Current Meds  Medication Sig  . aspirin EC 81 MG EC tablet Take 1 tablet (81 mg total) by mouth daily.  . cholecalciferol (VITAMIN D) 25 MCG (1000 UT) tablet Take 1,000 Units by mouth daily.  Marland Kitchen Co-Enzyme Q-10 100 MG CAPS Take 100 mg by mouth daily.   . COMBIGAN 0.2-0.5 % ophthalmic solution Place 1 drop into both eyes 2 (two) times daily.  . dorzolamide (TRUSOPT) 2 % ophthalmic  solution INSTILL 1 DROP INTO LEFT EYE TWICE A DAY  . losartan (COZAAR) 25 MG tablet TAKE 1 TABLET BY MOUTH EVERY DAY  . MEGARED OMEGA-3 KRILL OIL 500 MG CAPS Take 1 capsule by mouth daily.  . metoprolol tartrate (LOPRESSOR) 25 MG tablet TAKE HALF A TABLET BY MOUTH 2 TIMES DAILY.  . Multiple Vitamin (MULTIVITAMIN WITH MINERALS) TABS Take 1 tablet by mouth daily. alive  . RHOPRESSA 0.02 % SOLN PLACE 1 DROP INTO THE LEFT EYE NIGHTLY  . rosuvastatin (CRESTOR) 10 MG tablet Take 1 tablet (10 mg total) by mouth daily.  . tamsulosin (FLOMAX) 0.4 MG CAPS capsule TAKE 1 CAPSULE BY MOUTH EVERYDAY AT BEDTIME     Allergies  Allergen Reactions  . Ace Inhibitors Other (See Comments)    cough  . Lisinopril Cough  . Oxycodone     Hallucinations    Social History   Socioeconomic History  . Marital status: Married    Spouse name: Not on file  . Number of children: Not on file  . Years of education: Not on file  . Highest education level: Not on file  Occupational History  . Not on file  Tobacco Use  . Smoking status: Former Smoker    Packs/day: 0.25    Years: 1.00    Pack years: 0.25    Types: Cigarettes    Quit date: 04/20/1967    Years since quitting: 53.0  . Smokeless tobacco: Never Used  Substance and  Sexual Activity  . Alcohol use: No  . Drug use: No  . Sexual activity: Not on file  Other Topics Concern  . Not on file  Social History Narrative  . Not on file   Social Determinants of Health   Financial Resource Strain:   . Difficulty of Paying Living Expenses: Not on file  Food Insecurity:   . Worried About Programme researcher, broadcasting/film/video in the Last Year: Not on file  . Ran Out of Food in the Last Year: Not on file  Transportation Needs:   . Lack of Transportation (Medical): Not on file  . Lack of Transportation (Non-Medical): Not on file  Physical Activity:   . Days of Exercise per Week: Not on file  . Minutes of Exercise per Session: Not on file  Stress:   . Feeling of Stress :  Not on file  Social Connections:   . Frequency of Communication with Friends and Family: Not on file  . Frequency of Social Gatherings with Friends and Family: Not on file  . Attends Religious Services: Not on file  . Active Member of Clubs or Organizations: Not on file  . Attends Banker Meetings: Not on file  . Marital Status: Not on file  Intimate Partner Violence:   . Fear of Current or Ex-Partner: Not on file  . Emotionally Abused: Not on file  . Physically Abused: Not on file  . Sexually Abused: Not on file     Review of Systems: General: negative for chills, fever, night sweats or weight changes.  Cardiovascular: negative for chest pain, dyspnea on exertion, edema, orthopnea, palpitations, paroxysmal nocturnal dyspnea or shortness of breath Dermatological: negative for rash Respiratory: negative for cough or wheezing Urologic: negative for hematuria Abdominal: negative for nausea, vomiting, diarrhea, bright red blood per rectum, melena, or hematemesis Neurologic: negative for visual changes, syncope, or dizziness All other systems reviewed and are otherwise negative except as noted above.    Blood pressure 122/70, pulse (!) 51, height 5\' 11"  (1.803 m), weight 182 lb (82.6 kg).  General appearance: alert and no distress Neck: no adenopathy, no carotid bruit, no JVD, supple, symmetrical, trachea midline and thyroid not enlarged, symmetric, no tenderness/mass/nodules Lungs: clear to auscultation bilaterally Heart: regular rate and rhythm, S1, S2 normal, no murmur, click, rub or gallop Extremities: extremities normal, atraumatic, no cyanosis or edema Pulses: 2+ and symmetric Skin: Skin color, texture, turgor normal. No rashes or lesions Neurologic: Alert and oriented X 3, normal strength and tone. Normal symmetric reflexes. Normal coordination and gait  EKG sinus bradycardia 51 without ST or T wave changes. I  Personally reviewed this EKG.  ASSESSMENT AND PLAN:    Dyslipidemia, goal LDL below 70 History of hyperlipidemia on statin therapy with lipid profile recently performed by his PCP 01/29/2020 revealing total cholesterol 148, LDL of 84 and HDL of 51.  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 14/7/13 04/17/2012 revealing three-vessel disease.  Ultimately underwent CABG x5 by Dr. 14/08/2011 on 08/19/2011 with a LIMA to his LAD, vein to diagonal branch, obtuse marginal branch and distal circumflex as well as PDA.  He is done well since.  He denies chest pain or shortness of breath.  Essential hypertension History of essential hypertension a blood pressure measured today 122/70.  He is on losartan and metoprolol.      10/19/2011 MD FACP,FACC,FAHA, [facility] 04/21/2020 4:34 PM

## 2020-04-21 NOTE — Assessment & Plan Note (Signed)
History of hyperlipidemia on statin therapy with lipid profile recently performed by his PCP 01/29/2020 revealing total cholesterol 148, LDL of 84 and HDL of 51.

## 2020-05-08 ENCOUNTER — Other Ambulatory Visit: Payer: Self-pay | Admitting: Cardiovascular Disease

## 2020-05-11 ENCOUNTER — Other Ambulatory Visit: Payer: Self-pay | Admitting: Cardiovascular Disease

## 2020-06-07 ENCOUNTER — Other Ambulatory Visit: Payer: Self-pay | Admitting: Cardiovascular Disease

## 2020-07-08 ENCOUNTER — Other Ambulatory Visit: Payer: Self-pay | Admitting: Cardiovascular Disease

## 2020-09-15 ENCOUNTER — Other Ambulatory Visit: Payer: Self-pay | Admitting: Cardiovascular Disease

## 2020-09-16 NOTE — Telephone Encounter (Signed)
Rx(s) sent to pharmacy electronically.  

## 2021-01-31 ENCOUNTER — Other Ambulatory Visit: Payer: Self-pay | Admitting: Urology

## 2021-02-01 ENCOUNTER — Telehealth: Payer: Self-pay | Admitting: Cardiovascular Disease

## 2021-02-01 DIAGNOSIS — R002 Palpitations: Secondary | ICD-10-CM

## 2021-02-01 NOTE — Telephone Encounter (Signed)
Patient c/o Palpitations:  High priority if patient c/o lightheadedness, shortness of breath, or chest pain  How long have you had palpitations/irregular HR/ Afib? Are you having the symptoms now? Couple days, no  Are you currently experiencing lightheadedness, SOB or CP? no  Do you have a history of afib (atrial fibrillation) or irregular heart rhythm? no  Have you checked your BP or HR? (document readings if available): HR 53 regular  Are you experiencing any other symptoms? no   Patient states for the past couple days he has been having premature beats. He says he is not having symptoms now, but usually has them at night when he tries to sleep.

## 2021-02-01 NOTE — Telephone Encounter (Signed)
Called patient, advised that for the last few nights he has noticed that he can hear his palpitations in his head. He does not "feel" them. He states that it does not feel irregular to him, but he only notices this at night. He states that it happens when he lays down for bed. I advised if he had issues with his blood pressure- patient denies having any issues, and states it is normally good. He is on his medications on his list including metoprolol and states at times his HR has been as low as 47. Patient states that he feels okay otherwise- his last appointment was in December of 2021- has his 12 month follow up in January 2023. Patient has not had recent blood work done in 2 years per epic- patient states he has not had any that he is aware of. No recent testing or monitor ordered. Will route to MD to see if he would like to do any of this before his follow up.   Patient aware we will call back with any recommendations.

## 2021-02-04 NOTE — Telephone Encounter (Signed)
Would advise monitoring palpitations and if they are worse, Dr. Allyson Sabal may order a monitor when he returns.  Dr. Rennis Golden

## 2021-02-17 ENCOUNTER — Ambulatory Visit (INDEPENDENT_AMBULATORY_CARE_PROVIDER_SITE_OTHER): Payer: Medicare PPO

## 2021-02-17 DIAGNOSIS — R002 Palpitations: Secondary | ICD-10-CM

## 2021-02-17 NOTE — Telephone Encounter (Signed)
Spoke with pt regarding Dr. Hazle Coca recommendations. Orders placed for 2 week zio. All questions answered. Pt verbalizes understanding.

## 2021-02-17 NOTE — Progress Notes (Unsigned)
Patient enrolled for [employer] to mail a 14 day ZIO XT monitor to his address on file. 

## 2021-02-20 DIAGNOSIS — R002 Palpitations: Secondary | ICD-10-CM | POA: Diagnosis not present

## 2021-02-28 IMAGING — CT CT RENAL STONE PROTOCOL
2 of 4 series · 16 of 46 positions shown, 18 images · non-contrast
Comparison: Abdominal MRI 11/05/2012.

CLINICAL DATA: Left flank pain today.

EXAM:
CT ABDOMEN AND PELVIS WITHOUT CONTRAST
TECHNIQUE: Multidetector CT imaging of the abdomen and pelvis was performed
following the standard protocol without IV contrast.

[Series 3: ap without · axial · non-contrast · 0.68mm/px · z∈[+902,+1288]mm · 13 of 89 slices shown, 15 images]
[im 6/89  soft-tissue]
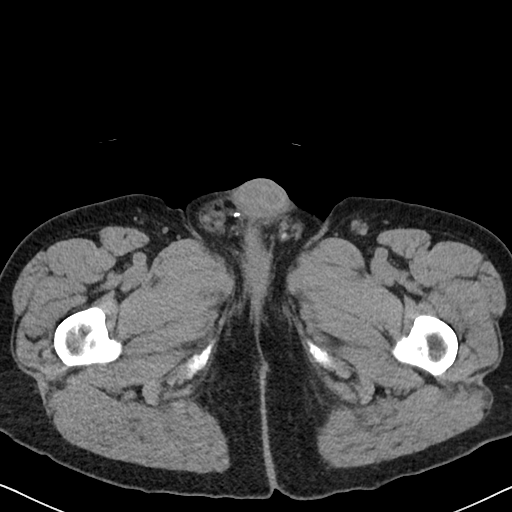
[im 6/89  bone]
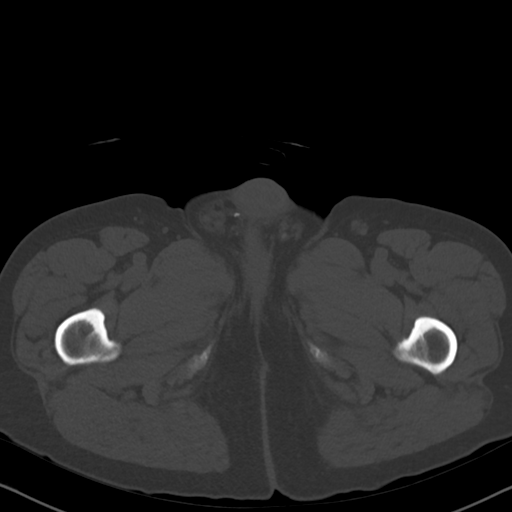
[im 12/89  soft-tissue]
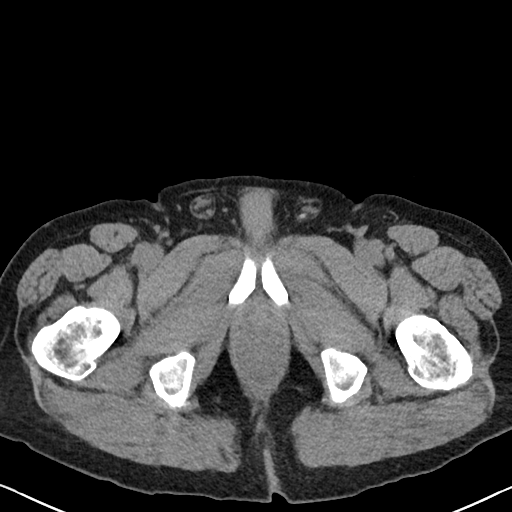
[im 17/89  soft-tissue]
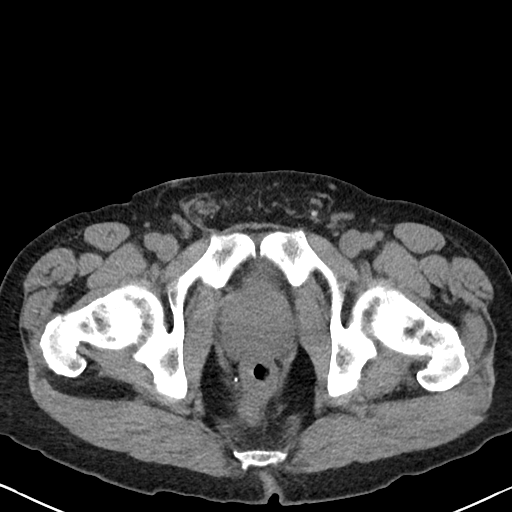
[im 28/89  soft-tissue]
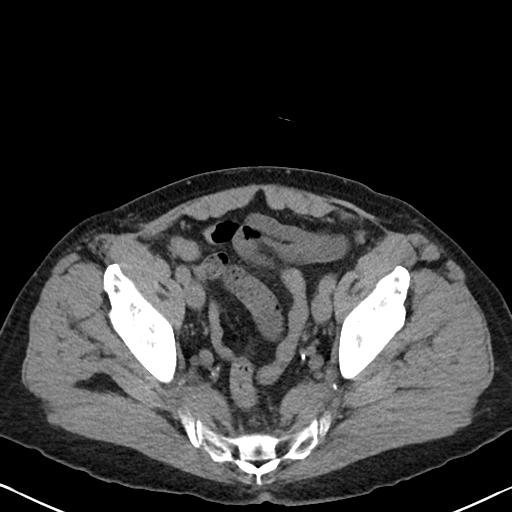
[im 34/89  soft-tissue]
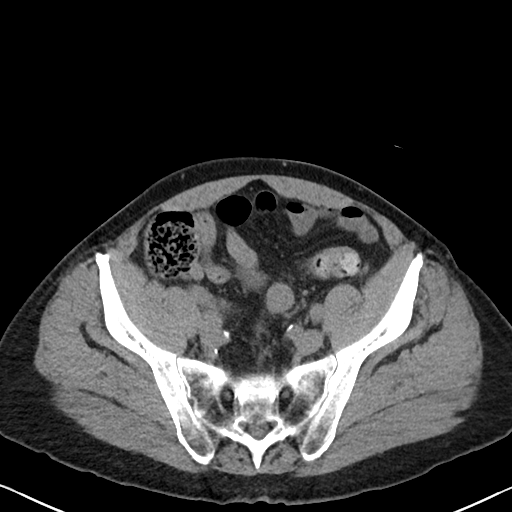
[im 39/89  soft-tissue]
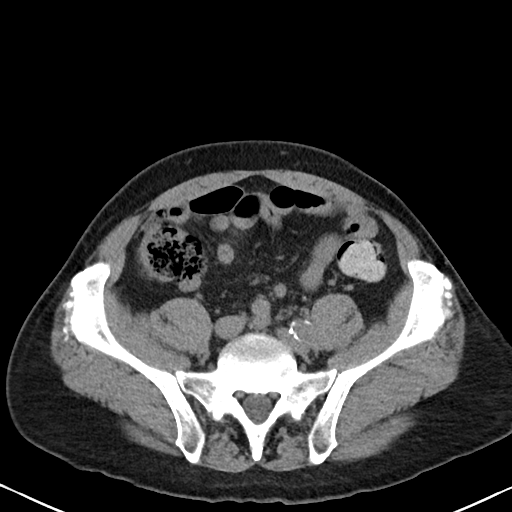
[im 45/89  soft-tissue]
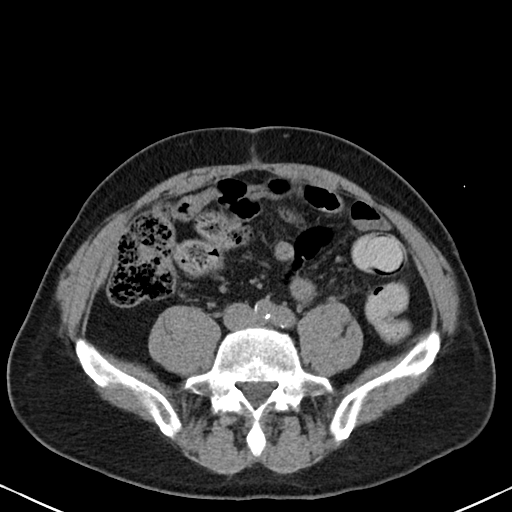
[im 50/89  soft-tissue]
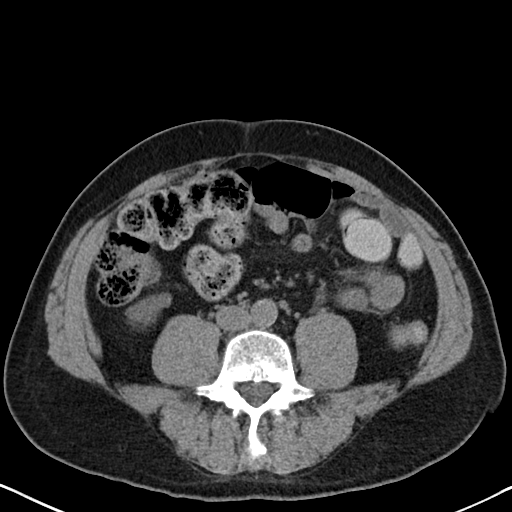
[im 56/89  soft-tissue]
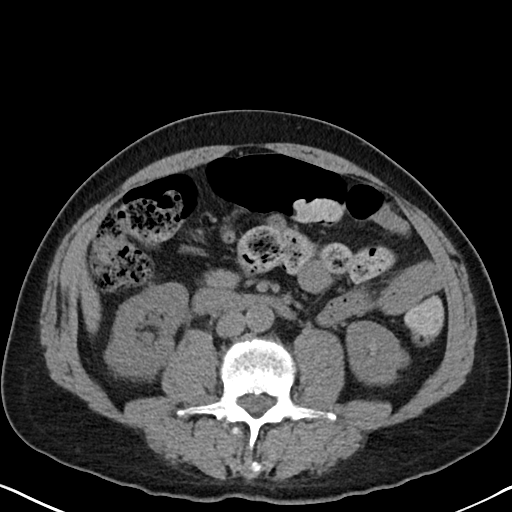
[im 56/89  bone]
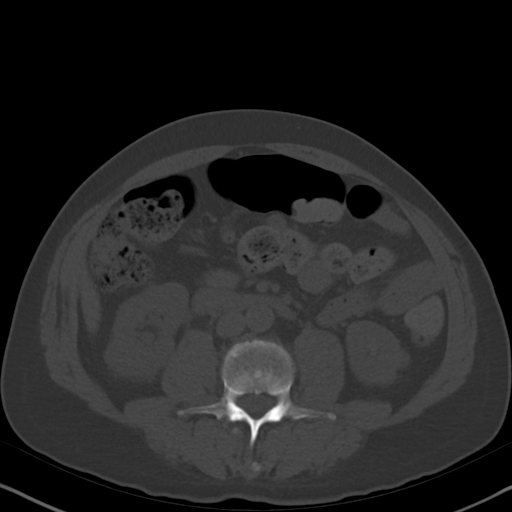
[im 61/89  soft-tissue]
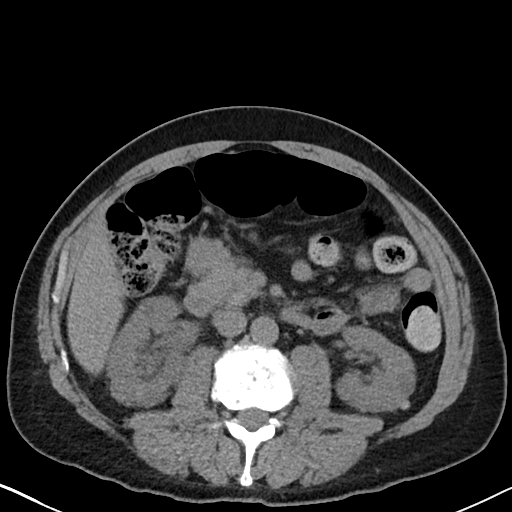
[im 72/89  soft-tissue]
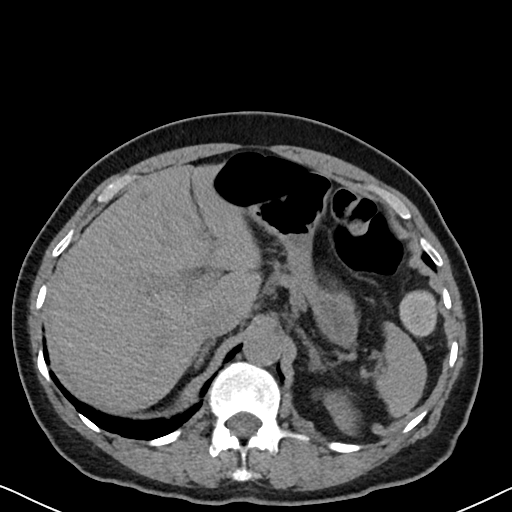
[im 78/89  soft-tissue]
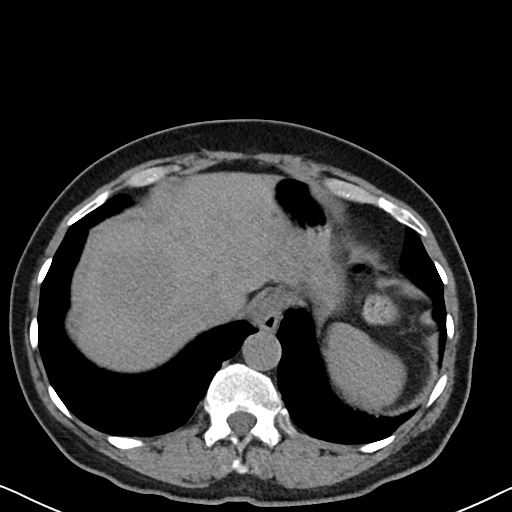
[im 83/89  soft-tissue]
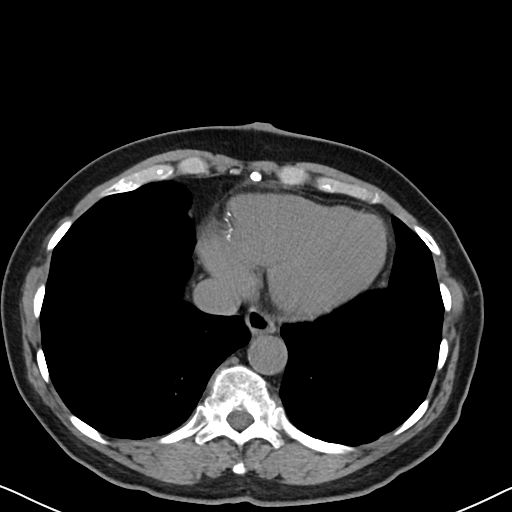

[Series 6: cor · coronal · 0.63mm/px · 3 of 100 slices shown]
[im 34/100  soft-tissue]
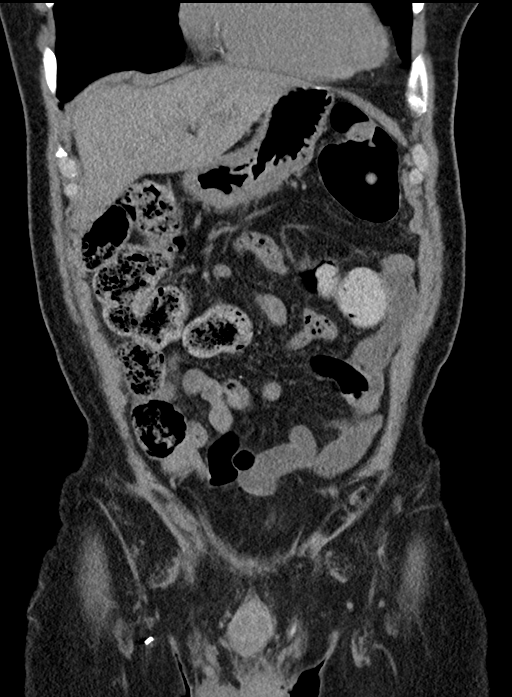
[im 45/100  soft-tissue]
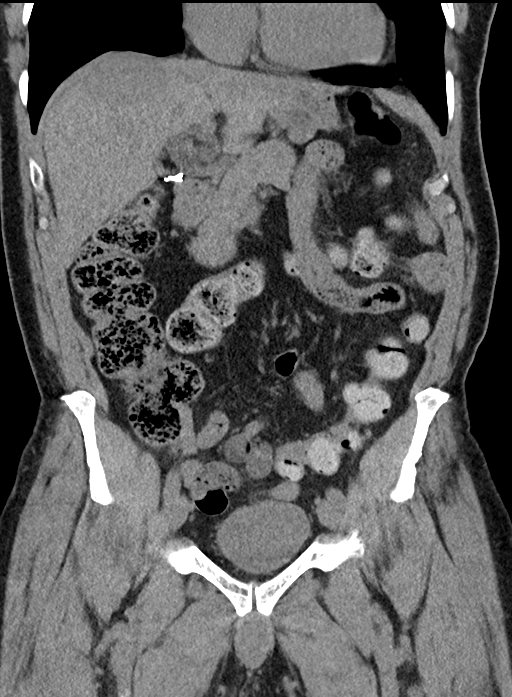
[im 56/100  soft-tissue]
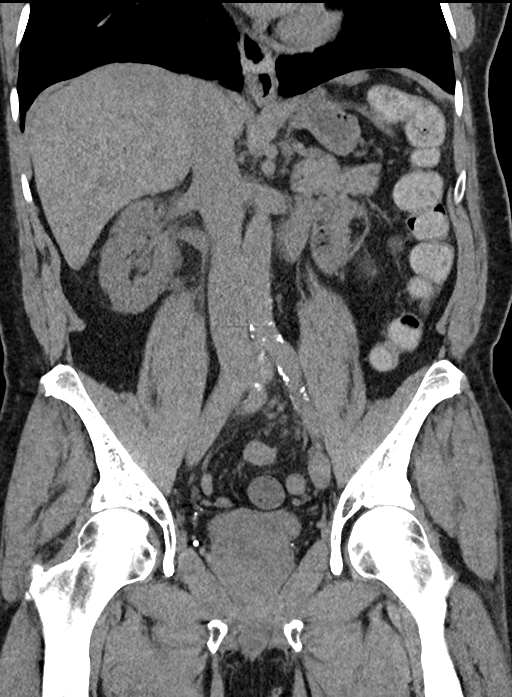

[16 of 46 positions shown; findings below may reference images not displayed]

FINDINGS: Lower chest: Lung bases clear. No pleural or pericardial effusion.
Tiny Bochdalek's hernia on the left noted.

Hepatobiliary: No focal liver abnormality is seen. Status post
cholecystectomy. No biliary dilatation.

Pancreas: Unremarkable. No pancreatic ductal dilatation or
surrounding inflammatory changes.

Spleen: Normal in size without focal abnormality.

Adrenals/Urinary Tract: There is mild right hydronephrosis due to a
0.2 cm stone in the mid right ureter at the level of L4-5. No other
stones are identified. Scattered very small hyperattenuating lesions
in both kidneys are likely cysts. The appearance is not grossly
changed compared to the prior MRI. Urinary bladder appears normal.
Mild thickening of the adrenal glands is compatible with
hyperplasia.

Stomach/Bowel: Stomach is within normal limits. Status post
appendectomy. No evidence of bowel wall thickening, distention, or
inflammatory changes.

Vascular/Lymphatic: Aortic atherosclerosis. No enlarged abdominal or
pelvic lymph nodes.

Reproductive: Moderate prostatomegaly.

Other: Very small fat containing umbilical hernia noted.

Musculoskeletal: No acute or focal abnormality. Degenerative disc
disease L2-3 is identified.
IMPRESSION: Mild right hydronephrosis due to a 0.2 cm right ureteral stone. No
other acute abnormality.

Atherosclerosis.

Prostatomegaly.

## 2021-04-03 ENCOUNTER — Other Ambulatory Visit: Payer: Self-pay | Admitting: Cardiovascular Disease

## 2021-04-27 ENCOUNTER — Other Ambulatory Visit: Payer: Self-pay | Admitting: Cardiovascular Disease

## 2021-05-17 ENCOUNTER — Ambulatory Visit: Payer: Medicare PPO | Admitting: Cardiovascular Disease

## 2021-05-17 ENCOUNTER — Encounter: Payer: Self-pay | Admitting: Cardiovascular Disease

## 2021-05-17 ENCOUNTER — Other Ambulatory Visit: Payer: Self-pay

## 2021-05-17 VITALS — BP 126/66 | HR 52 | Ht 71.0 in | Wt 174.8 lb

## 2021-05-17 DIAGNOSIS — E785 Hyperlipidemia, unspecified: Secondary | ICD-10-CM

## 2021-05-17 DIAGNOSIS — I10 Essential (primary) hypertension: Secondary | ICD-10-CM

## 2021-05-17 DIAGNOSIS — Z951 Presence of aortocoronary bypass graft: Secondary | ICD-10-CM

## 2021-05-17 NOTE — Assessment & Plan Note (Signed)
History of dyslipidemia on statin therapy with lipid profile performed 5/22 revealing total cholesterol of 145, LDL 77 and HDL of 53.

## 2021-05-17 NOTE — Patient Instructions (Signed)
Medication Instructions:  Your physician recommends that you continue on your current medications as directed. Please refer to the Current Medication list given to you today.  *If you need a refill on your cardiac medications before your next appointment, please call your pharmacy*   Lab Work: Your physician recommends that you return for lab work in: CMET and Lipid panel   If you have labs (blood work) drawn today and your tests are completely normal, you will receive your results only by: [place] (if you have [place]) OR A paper copy in the mail If you have any lab test that is abnormal or we need to change your treatment, we will call you to review the results.   Follow-Up: At [facility], you and your health needs are our priority.  As part of our continuing mission to provide you with exceptional heart care, we have created designated Provider Care Teams.  These Care Teams include your primary Cardiologist (physician) and Advanced Practice Providers (APPs -  Physician Assistants and Nurse Practitioners) who all work together to provide you with the care you need, when you need it.  We recommend signing up for the patient portal called "MyChart".  Sign up information is provided on this After Visit Summary.  MyChart is used to connect with patients for Virtual Visits (Telemedicine).  Patients are able to view lab/test results, encounter notes, upcoming appointments, etc.  Non-urgent messages can be sent to your provider as well.   To learn more about what you can do with MyChart, go to [email or web address].    Your next appointment:   12 month(s)  The format for your next appointment:   In Person  Provider:   Quay Burow, MD

## 2021-05-17 NOTE — Assessment & Plan Note (Signed)
History of essential hypertension a blood pressure measured today 126/66.  He is on metoprolol.

## 2021-05-17 NOTE — Progress Notes (Signed)
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  thin and fit-appearing, married [place] American male, father of 2, grandfather to 1 grandchild who I last saw in the office 04/21/2020.Marland Kitchen  He retired from [employer] of [employer] age 56... He presented to [facility] April 16, 2012, with a non-ST-segment-elevation myocardial infarction. He was catheterized by Dr. Glenetta Hew radially on April 17, 2012, revealing 2-vessel disease. His EF was normal with mild interior hypokinesia. He underwent coronary artery bypass grafting x5 by Dr. Tharon Aquas Trigt April 19, 2012, with a LIMA to his LAD, a vein to his diagonal branch, a vein to an obtuse marginal branch and distal circumflex. There was also a vein to a PDA. His postop course was uncomplicated. He was seen at [facility] May 07, 2012, with delirium and was found to have a UTI. Cultures revealed Klebsiella pneumoniae. He was treated with Keflex and subsequently improved.I saw him in the office 02/19/13. 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 Since I saw him a year ago he remains asymptomatic. 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   Since I saw him over a year ago he continues to do well.  He works out on a recumbent bike and lifts weights frequently without limitation.  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   Current Meds  Medication Sig   aspirin EC 81 MG EC tablet Take 1 tablet (81 mg total) by mouth daily.   cholecalciferol (VITAMIN D) 25 MCG (1000 UT) tablet Take 1,000 Units by mouth daily.   Co-Enzyme Q-10 100 MG CAPS Take 100 mg by mouth daily.    COMBIGAN 0.2-0.5 % ophthalmic  solution Place 1 drop into both eyes 2 (two) times daily.   dorzolamide (TRUSOPT) 2 % ophthalmic solution INSTILL 1 DROP INTO LEFT EYE TWICE A DAY   losartan (COZAAR) 25 MG tablet TAKE 1 TABLET BY MOUTH EVERY DAY   MEGARED OMEGA-3 KRILL OIL 500 MG CAPS Take 1 capsule by mouth daily.   metoprolol tartrate (LOPRESSOR) 25 MG tablet TAKE HALF A TABLET BY MOUTH 2 TIMES DAILY.   Multiple Vitamin (MULTIVITAMIN WITH MINERALS) TABS Take 1 tablet by mouth daily. alive   RHOPRESSA 0.02 % SOLN BOTH EYES   rosuvastatin (CRESTOR) 10 MG tablet TAKE 1 TABLET BY MOUTH EVERY DAY (Patient taking differently: every other day.)   tamsulosin (FLOMAX) 0.4 MG CAPS capsule TAKE 1 CAPSULE BY MOUTH EVERYDAY AT BEDTIME (Patient taking differently: every other day.)     Allergies  Allergen Reactions   Ace Inhibitors Other (See Comments)    cough   Lisinopril Cough   Oxycodone     Hallucinations    Social History   Socioeconomic History   Marital status: Married    Spouse name: Not on file   Number of children: Not on file   Years of education: Not on file   Highest education level: Not on file  Occupational History   Not on file  Tobacco Use   Smoking status: Former    Packs/day: 0.25    Years: 1.00    Pack years: 0.25  Types: Cigarettes    Quit date: 04/20/1967    Years since quitting: 54.1   Smokeless tobacco: Never  Substance and Sexual Activity   Alcohol use: No   Drug use: No   Sexual activity: Not on file  Other Topics Concern   Not on file  Social History Narrative   Not on file   Social Determinants of Health   Financial Resource Strain: Not on file  Food Insecurity: Not on file  Transportation Needs: Not on file  Physical Activity: Not on file  Stress: Not on file  Social Connections: Not on file  Intimate Partner Violence: Not on file     Review of Systems: General: negative for chills, fever, night sweats or weight changes.  Cardiovascular: negative for chest pain, dyspnea  on exertion, edema, orthopnea, palpitations, paroxysmal nocturnal dyspnea or shortness of breath Dermatological: negative for rash Respiratory: negative for cough or wheezing Urologic: negative for hematuria Abdominal: negative for nausea, vomiting, diarrhea, bright red blood per rectum, melena, or hematemesis Neurologic: negative for visual changes, syncope, or dizziness All other systems reviewed and are otherwise negative except as noted above.    Blood pressure 126/66, pulse (!) 52, height 5\' 11"  (1.803 m), weight 174 lb 12.8 oz (79.3 kg), SpO2 99 %.  General appearance: alert and no distress Neck: no adenopathy, no carotid bruit, no JVD, supple, symmetrical, trachea midline, and thyroid not enlarged, symmetric, no tenderness/mass/nodules Lungs: clear to auscultation bilaterally Heart: regular rate and rhythm, S1, S2 normal, no murmur, click, rub or gallop Extremities: extremities normal, atraumatic, no cyanosis or edema Pulses: 2+ and symmetric Skin: Skin color, texture, turgor normal. No rashes or lesions Neurologic: Grossly normal  EKG sinus bradycardia 52 with marked sinus arrhythmia.  I personally reviewed this EKG.  ASSESSMENT AND PLAN:   Dyslipidemia, goal LDL below 70 History of dyslipidemia on statin therapy with lipid profile performed 5/22 revealing total cholesterol of 145, LDL 77 and HDL of 53.  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 Darcey Nora with a LIMA to the LAD, vein to diagonal branch, obtuse marginal branch and distal circumflex as well as a vein to the PDA.  This was done after heart cath performed by Dr. Ellyn Hack 2 days before in the setting of "non-STEMI.  He has been stable since and denies chest pain or shortness of breath.  Essential hypertension History of essential hypertension a blood pressure measured today 126/66.  He is on metoprolol.     Lorretta Harp MD FACP,FACC,FAHA,  [facility] 05/17/2021 3:43 PM

## 2021-05-17 NOTE — Assessment & Plan Note (Signed)
History of CAD status post coronary artery bypass grafting 04/19/2012 by Dr. Maren Beach with a LIMA to the LAD, vein to diagonal branch, obtuse marginal branch and distal circumflex as well as a vein to the PDA.  This was done after heart cath performed by Dr. Herbie Baltimore 2 days before in the setting of "non-STEMI.  He has been stable since and denies chest pain or shortness of breath.

## 2021-05-19 ENCOUNTER — Telehealth: Payer: Self-pay

## 2021-05-19 DIAGNOSIS — E785 Hyperlipidemia, unspecified: Secondary | ICD-10-CM

## 2021-05-19 LAB — COMPREHENSIVE METABOLIC PANEL
ALT: 18 IU/L (ref 0–44)
AST: 26 IU/L (ref 0–40)
Albumin/Globulin Ratio: 2.3 — ABNORMAL HIGH (ref 1.2–2.2)
Albumin: 4.5 g/dL (ref 3.7–4.7)
Alkaline Phosphatase: 55 IU/L (ref 44–121)
BUN/Creatinine Ratio: 17 (ref 10–24)
BUN: 19 mg/dL (ref 8–27)
Bilirubin Total: 1.5 mg/dL — ABNORMAL HIGH (ref 0.0–1.2)
CO2: 26 mmol/L (ref 20–29)
Calcium: 9.4 mg/dL (ref 8.6–10.2)
Chloride: 105 mmol/L (ref 96–106)
Creatinine, Ser: 1.15 mg/dL (ref 0.76–1.27)
Globulin, Total: 2 g/dL (ref 1.5–4.5)
Glucose: 86 mg/dL (ref 70–99)
Potassium: 4.1 mmol/L (ref 3.5–5.2)
Sodium: 141 mmol/L (ref 134–144)
Total Protein: 6.5 g/dL (ref 6.0–8.5)
eGFR: 66 mL/min/{1.73_m2} (ref >59)

## 2021-05-19 LAB — LIPID PANEL
Chol/HDL Ratio: 2.8 ratio (ref 0.0–5.0)
Cholesterol, Total: 159 mg/dL (ref 100–199)
HDL: 56 mg/dL (ref >39)
LDL Chol Calc (NIH): 89 mg/dL (ref 0–99)
Triglycerides: 71 mg/dL (ref 0–149)
VLDL Cholesterol Cal: 14 mg/dL (ref 5–40)

## 2021-05-19 MED ORDER — ROSUVASTATIN CALCIUM 10 MG PO TABS: 10.0000 mg | ORAL_TABLET | Freq: Every day | ORAL | 3 refills | Status: DC

## 2021-05-19 NOTE — Telephone Encounter (Signed)
Spoke with pt regarding lab results and recommendations per Dr. Allyson Sabal. Pt is taking crestor 10mg  every other day. Pt had issues when he was taking statin every day so he went to every other day and was having success. Pt will start taking crestor 10mg  every day. Advised pt that we will check labs in 3 months. Prescription sent in to pharmacy of choice lab orders placed. Pt verbalizes understanding.

## 2021-06-26 LAB — COLOGUARD: COLOGUARD: POSITIVE — AB

## 2021-09-08 ENCOUNTER — Other Ambulatory Visit: Payer: Self-pay | Admitting: Cardiovascular Disease

## 2021-09-08 LAB — LIPID PANEL
Chol/HDL Ratio: 2.4 ratio (ref 0.0–5.0)
Cholesterol, Total: 137 mg/dL (ref 100–199)
HDL: 56 mg/dL (ref >39)
LDL Chol Calc (NIH): 66 mg/dL (ref 0–99)
Triglycerides: 75 mg/dL (ref 0–149)
VLDL Cholesterol Cal: 15 mg/dL (ref 5–40)

## 2021-09-08 LAB — HEPATIC FUNCTION PANEL
ALT: 18 IU/L (ref 0–44)
AST: 29 IU/L (ref 0–40)
Albumin: 4.2 g/dL (ref 3.7–4.7)
Alkaline Phosphatase: 48 IU/L (ref 44–121)
Bilirubin Total: 1.2 mg/dL (ref 0.0–1.2)
Bilirubin, Direct: 0.33 mg/dL (ref 0.00–0.40)
Total Protein: 6 g/dL (ref 6.0–8.5)

## 2022-01-21 ENCOUNTER — Other Ambulatory Visit: Payer: Self-pay | Admitting: Cardiovascular Disease

## 2022-02-11 ENCOUNTER — Other Ambulatory Visit: Payer: Self-pay | Admitting: Cardiovascular Disease

## 2022-08-05 ENCOUNTER — Other Ambulatory Visit: Payer: Self-pay | Admitting: Cardiovascular Disease

## 2022-08-16 ENCOUNTER — Other Ambulatory Visit: Payer: Self-pay | Admitting: Cardiovascular Disease

## 2022-09-04 NOTE — Progress Notes (Signed)
Cardiology Clinic Note   Date: 09/05/2022 ID: Tyler Seta, MD, DOB Jan 22, 1946, MRN [id number]  Primary Cardiologist:  Nanetta Batty, MD  Patient Profile    Tyler Seta, MD is a 77 y.o. male who presents to the clinic today for 1 year follow-up.  Past medical history significant for: CAD. LHC 04/17/2012 (NSTEMI): Multivessel CAD with mid RCA 80%, distal RCA 100%, and tandem proximal to mid LAD 70 to 80% along with moderate to severe bifurcation disease and circumflex-OM1 ~60 to 70%.  Recommend CT surgery consult for CABG consideration. CABG x 5 04/19/2012: LIMA to LAD.  SVG to the diagonal.  SVG to OM.  SVG to distal circumflex.  SVG to posterior descending. Palpitations. 14-day ZIO 03/14/2021: Min HR 39 bpm, max HR 120 bpm, average HR 53 bpm.  Predominant underlying rhythm sinus rhythm.  First-degree AV block present.  7 supraventricular tachycardia runs, fastest and longest 21.6 seconds with max rate 120 bpm.  Some episodes of supraventricular tachycardia may be A. tach with variable block.  Frequent PVCs (6.5%), occasional PACs. Hypertension. Hyperlipidemia. Lipid panel 08/29/2022: LDL 63, HDL 50, TG 74, total 128.    History of Present Illness    Tyler Seta, MD was first evaluated by Dr. Allyson Sabal on 04/16/2012 during hospitalization for NSTEMI.  He underwent LHC which showed multivessel CAD and ultimately underwent CABG x 5.  He has been followed since for the above outlined history.  Patient was last seen in the office by Dr. Allyson Sabal on 05/17/2021.  He was doing well at that time and no changes were made.  Today, patient is alone today. He is doing well. Patient denies shortness of breath or dyspnea on exertion. No chest pain, pressure, or tightness. Denies lower extremity edema, orthopnea, or PND. No palpitations. He stays active walking 2 miles at least three times a week. He restricts dietary sodium.     ROS: All other systems reviewed and are otherwise negative except  as noted in History of Present Illness.  Studies Reviewed    ECG personally reviewed by me today: Sinus bradycardia, 1st degree AV block, rate 51 bpm.  No significant changes from 05/17/2021.      Physical Exam    VS:  BP 122/64 (BP Location: Left Arm, Patient Position: Sitting, Cuff Size: Normal)   Pulse (!) 51   Ht  (1.803 m)   Wt 178 lb 3.2 oz (80.8 kg)   SpO2 96%   BMI 24.85 kg/m  , BMI Body mass index is 24.85 kg/m.  GEN: Well nourished, well developed, in no acute distress. Neck: No JVD or carotid bruits. Cardiac:  RRR. No murmurs. No rubs or gallops.   Respiratory:  Respirations regular and unlabored. Clear to auscultation without rales, wheezing or rhonchi. GI: Soft, nontender, nondistended. Extremities: Radials/DP/PT 2+ and equal bilaterally. No clubbing or cyanosis. No edema.  Skin: Warm and dry, no rash. Neuro: Strength intact.  Assessment & Plan   CAD.  S/p CABG x 18 April 2012.  Patient denies chest pain, pressure, or tightness. He is active walking 2 miles three times a week. Continue aspirin, metoprolol, Crestor. Palpitations.  14-day ZIO October 2022 showed runs of SVT, frequent PVCs, and occasional PACs.  Patient denies palpitations.  Continue metoprolol. Hypertension.  BP today 122/64.  Patient denies headaches or dizziness.  Continue losartan and metoprolol. Hyperlipidemia.  LDL April 2024 63, at goal.  Continue Crestor.    Disposition: Renew medications today. Return in 1 year or  sooner as needed.          Signed, Etta Grandchild. Trinnity Breunig, DNP, NP-C

## 2022-09-05 ENCOUNTER — Ambulatory Visit: Payer: Medicare PPO | Attending: Student | Admitting: Student

## 2022-09-05 ENCOUNTER — Encounter: Payer: Self-pay | Admitting: Student

## 2022-09-05 VITALS — BP 122/64 | HR 51 | Ht 71.0 in | Wt 178.2 lb

## 2022-09-05 DIAGNOSIS — E785 Hyperlipidemia, unspecified: Secondary | ICD-10-CM

## 2022-09-05 DIAGNOSIS — I2510 Atherosclerotic heart disease of native coronary artery without angina pectoris: Secondary | ICD-10-CM

## 2022-09-05 DIAGNOSIS — Z951 Presence of aortocoronary bypass graft: Secondary | ICD-10-CM | POA: Diagnosis not present

## 2022-09-05 DIAGNOSIS — I10 Essential (primary) hypertension: Secondary | ICD-10-CM

## 2022-09-05 MED ORDER — METOPROLOL TARTRATE 25 MG PO TABS: ORAL_TABLET | ORAL | 3 refills | Status: DC

## 2022-09-05 MED ORDER — LOSARTAN POTASSIUM 25 MG PO TABS: 25.0000 mg | ORAL_TABLET | Freq: Every day | ORAL | 3 refills | Status: DC

## 2022-09-05 MED ORDER — ROSUVASTATIN CALCIUM 10 MG PO TABS: 10.0000 mg | ORAL_TABLET | Freq: Every day | ORAL | 3 refills | Status: DC

## 2022-09-05 NOTE — Patient Instructions (Signed)
Medication Instructions:  Your physician recommends that you continue on your current medications as directed. Please refer to the Current Medication list given to you today.  *If you need a refill on your cardiac medications before your next appointment, please call your pharmacy*   Lab Work: NONE If you have labs (blood work) drawn today and your tests are completely normal, you will receive your results only by: MyChart Message (if you have MyChart) OR A paper copy in the mail If you have any lab test that is abnormal or we need to change your treatment, we will call you to review the results.   Testing/Procedures: NONE   Follow-Up: At [facility], you and your health needs are our priority.  As part of our continuing mission to provide you with exceptional heart care, we have created designated Provider Care Teams.  These Care Teams include your primary Cardiologist (physician) and Advanced Practice Providers (APPs -  Physician Assistants and Nurse Practitioners) who all work together to provide you with the care you need, when you need it.  We recommend signing up for the patient portal called "MyChart".  Sign up information is provided on this After Visit Summary.  MyChart is used to connect with patients for Virtual Visits (Telemedicine).  Patients are able to view lab/test results, encounter notes, upcoming appointments, etc.  Non-urgent messages can be sent to your provider as well.   To learn more about what you can do with MyChart, go to [email or web address].    Your next appointment:   1 year(s)  Provider:   Jonathan Berry, MD    

## 2023-07-26 ENCOUNTER — Other Ambulatory Visit: Payer: Self-pay | Admitting: Student

## 2023-09-03 ENCOUNTER — Encounter: Payer: Self-pay | Admitting: Cardiovascular Disease

## 2023-09-03 ENCOUNTER — Ambulatory Visit: Payer: Medicare PPO | Attending: Cardiovascular Disease | Admitting: Cardiovascular Disease

## 2023-09-03 VITALS — BP 132/62 | HR 52 | Resp 16 | Ht 71.0 in | Wt 179.8 lb

## 2023-09-03 DIAGNOSIS — Z951 Presence of aortocoronary bypass graft: Secondary | ICD-10-CM | POA: Diagnosis not present

## 2023-09-03 DIAGNOSIS — I10 Essential (primary) hypertension: Secondary | ICD-10-CM | POA: Diagnosis not present

## 2023-09-03 DIAGNOSIS — I2510 Atherosclerotic heart disease of native coronary artery without angina pectoris: Secondary | ICD-10-CM

## 2023-09-03 DIAGNOSIS — E785 Hyperlipidemia, unspecified: Secondary | ICD-10-CM | POA: Diagnosis not present

## 2023-09-03 MED ORDER — METOPROLOL TARTRATE 25 MG PO TABS
ORAL_TABLET | ORAL | 3 refills | Status: AC
Start: 2023-09-03 — End: ?

## 2023-09-03 MED ORDER — ROSUVASTATIN CALCIUM 10 MG PO TABS: 10.0000 mg | ORAL_TABLET | Freq: Every day | ORAL | 3 refills | Status: AC

## 2023-09-03 MED ORDER — LOSARTAN POTASSIUM 25 MG PO TABS: 25.0000 mg | ORAL_TABLET | Freq: Every day | ORAL | 3 refills | Status: AC

## 2023-09-03 NOTE — Assessment & Plan Note (Signed)
 History of dyslipidemia on statin therapy with lipid profile performed/27/23 revealing total cholesterol 137, LDL 66 and HDL of 56.  Will repeat a fasting lipid liver profile today.

## 2023-09-03 NOTE — Assessment & Plan Note (Signed)
 History of essential hypertension with blood pressure measured today 132/62.  He is on metoprolol .

## 2023-09-03 NOTE — Progress Notes (Signed)
 09/03/2023 Tyler Grippe, MD   07/07/1945  [id number]  Primary Physician Emaline Handsome, MD Primary Cardiologist: Avanell Leigh MD Lathan Poke, [place], [place]  HPI:  Tyler Grippe, MD is a 78 y.o.  thin and fit-appearing, married [place] American male, father of 2, grandfather to 1 grandchild who I last saw in the office 05/17/2021.Tyler Fox  He retired from [employer] age 19... He presented to [facility] April 16, 2012, with a non-ST-segment-elevation myocardial infarction. He was catheterized by Dr. Randene Bustard radially on April 17, 2012, revealing 2-vessel disease. His EF was normal with mild interior hypokinesia. He underwent coronary artery bypass grafting x5 by Dr. Jerlyn Moons Trigt April 19, 2012, with a LIMA to his LAD, a vein to his diagonal branch, a vein to an obtuse marginal branch and distal circumflex. There was also a vein to a PDA. His postop course was uncomplicated. He was seen at [facility] May 07, 2012, with delirium and was found to have a UTI. Cultures revealed Klebsiella pneumoniae. He was treated with Keflex  and subsequently improved.I saw him in the office 02/19/13. 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 Since I saw him a year ago he remains asymptomatic. 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   Since I saw him over 2 years ago, he continues to do well.  He works out on a treadmill and walks 3 miles a day.  He is completely asymptomatic and specifically denies chest pain or shortness of breath.     Current Meds  Medication Sig   aspirin  EC 81 MG EC tablet Take 1 tablet (81 mg total) by mouth daily.   brinzolamide (AZOPT) 1 % ophthalmic suspension Place 1 drop into both eyes in the morning and at bedtime.   cholecalciferol (VITAMIN D) 25 MCG (1000 UT) tablet Take 1,000 Units by mouth daily.   Co-Enzyme Q-10 100 MG CAPS Take 100 mg by mouth  daily.    COMBIGAN 0.2-0.5 % ophthalmic solution Place 1 drop into both eyes 2 (two) times daily.   losartan  (COZAAR ) 25 MG tablet TAKE 1 TABLET (25 MG TOTAL) BY MOUTH DAILY.   MEGARED OMEGA-3 KRILL OIL 500 MG CAPS Take 1 capsule by mouth daily.   metoprolol  tartrate (LOPRESSOR ) 25 MG tablet Take half a tablet by mouth twice daily   Multiple Vitamin (MULTIVITAMIN WITH MINERALS) TABS Take 1 tablet by mouth daily. alive   RHOPRESSA 0.02 % SOLN BOTH EYES   rosuvastatin  (CRESTOR ) 10 MG tablet Take 1 tablet (10 mg total) by mouth daily.   tamsulosin (FLOMAX) 0.4 MG CAPS capsule TAKE 1 CAPSULE BY MOUTH EVERYDAY AT BEDTIME (Patient taking differently: daily.)     Allergies  Allergen Reactions   Ace Inhibitors Other (See Comments)    cough   Lisinopril Cough   Oxycodone      Hallucinations    Social History   Socioeconomic History   Marital status: Married    Spouse name: Not on file   Number of children: Not on file   Years of education: Not on file   Highest education level: Not on file  Occupational History   Not on file  Tobacco Use   Smoking status: Former    Current packs/day: 0.00    Average packs/day: 0.3 packs/day for 1 year (0.3 ttl pk-yrs)    Types: Cigarettes    Start date: 04/19/1966  Quit date: 04/20/1967    Years since quitting: 56.4   Smokeless tobacco: Never  Substance and Sexual Activity   Alcohol use: No   Drug use: No   Sexual activity: Not on file  Other Topics Concern   Not on file  Social History Narrative   Not on file   Social Drivers of Health   Financial Resource Strain: Low Risk  (08/30/2022)   Received from [facility], [facility]   Overall Financial Resource Strain (CARDIA)    Difficulty of Paying Living Expenses: Not hard at all  Food Insecurity: No Food Insecurity (08/30/2022)   Received from [facility], [facility]   Hunger Vital Sign    Worried About Running Out of Food in the Last Year: Never true    Ran Out of Food in the  Last Year: Never true  Transportation Needs: No Transportation Needs (08/30/2022)   Received from [facility], [facility]   PRAPARE - Transportation    Lack of Transportation (Medical): No    Lack of Transportation (Non-Medical): No  Physical Activity: Sufficiently Active (08/30/2022)   Received from [facility], [facility]   Exercise Vital Sign    Days of Exercise per Week: 3 days    Minutes of Exercise per Session: 50 min  Stress: No Stress Concern Present (08/30/2022)   Received from [facility], [facility] - Occupational Stress Questionnaire    Feeling of Stress : Not at all  Social Connections: Socially Integrated (08/30/2022)   Received from [facility], [facility]   Social Network    How would you rate your social network (family, work, friends)?: Good participation with social networks  Intimate Partner Violence: Not At Risk (08/30/2022)   Received from [facility], [facility]   HITS    Over the last 12 months how often did your partner physically hurt you?: Never    Over the last 12 months how often did your partner insult you or talk down to you?: Never    Over the last 12 months how often did your partner threaten you with physical harm?: Never    Over the last 12 months how often did your partner scream or curse at you?: Never     Review of Systems: General: negative for chills, fever, night sweats or weight changes.  Cardiovascular: negative for chest pain, dyspnea on exertion, edema, orthopnea, palpitations, paroxysmal nocturnal dyspnea or shortness of breath Dermatological: negative for rash Respiratory: negative for cough or wheezing Urologic: negative for hematuria Abdominal: negative for nausea, vomiting, diarrhea, bright red blood per rectum, melena, or hematemesis Neurologic: negative for visual changes, syncope, or dizziness All other systems reviewed and are otherwise negative except as noted  above.    Blood pressure 132/62, pulse (!) 52, resp. rate 16, height 5\' 11"  (1.803 m), weight 179 lb 12.8 oz (81.6 kg), SpO2 98%.  General appearance: alert and no distress Neck: no adenopathy, no carotid bruit, no JVD, supple, symmetrical, trachea midline, and thyroid not enlarged, symmetric, no tenderness/mass/nodules Lungs: clear to auscultation bilaterally Heart: regular rate and rhythm, S1, S2 normal, no murmur, click, rub or gallop Extremities: extremities normal, atraumatic, no cyanosis or edema Pulses: 2+ and symmetric Skin: Skin color, texture, turgor normal. No rashes or lesions Neurologic: Grossly normal  EKG EKG Interpretation Date/Time:  Monday September 03 2023 14:33:22 EDT Ventricular Rate:  48 PR Interval:  170 QRS Duration:  80 QT Interval:  454 QTC Calculation: 405  R Axis:   20  Text Interpretation: Sinus bradycardia with sinus arrhythmia Septal infarct , age undetermined When compared with ECG of 31-Mar-2013 14:10, Questionable change in QRS axis Confirmed by Lauro Portal [phone]) on 09/03/2023 2:44:00 PM    ASSESSMENT AND PLAN:   Dyslipidemia, goal LDL below 70 History of dyslipidemia on statin therapy with lipid profile performed/27/23 revealing total cholesterol 137, LDL 66 and HDL of 56.  Will repeat a fasting lipid liver profile today.  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 Addie Holstein 12//13 revealing two-vessel disease.  He underwent CABG x 5 by Dr. Jeb Miner 04/19/2012 with a LIMA to his LAD, vein to a diagonal branch, obtuse marginal branch and distal circumflex as well as a vein to the PDA.  Postop course was uncomplicated.  He is fairly active, denies chest pain or shortness of breath.  Essential hypertension History of essential hypertension with blood pressure measured today 132/62.  He is on metoprolol .     Avanell Leigh MD FACP,FACC,FAHA, [facility] 09/03/2023 2:53 PM

## 2023-09-03 NOTE — Assessment & Plan Note (Signed)
 History of CAD status post cardiac cath by Dr. Addie Holstein 12//13 revealing two-vessel disease.  He underwent CABG x 5 by Dr. Jeb Miner 04/19/2012 with a LIMA to his LAD, vein to a diagonal branch, obtuse marginal branch and distal circumflex as well as a vein to the PDA.  Postop course was uncomplicated.  He is fairly active, denies chest pain or shortness of breath.

## 2023-09-03 NOTE — Patient Instructions (Signed)
 Medication Instructions:  Your physician recommends that you continue on your current medications as directed. Please refer to the Current Medication list given to you today.  *If you need a refill on your cardiac medications before your next appointment, please call your pharmacy*  Lab Work: Your physician recommends that you have labs drawn today: CMET & Lipids  If you have labs (blood work) drawn today and your tests are completely normal, you will receive your results only by: MyChart Message (if you have MyChart) OR A paper copy in the mail If you have any lab test that is abnormal or we need to change your treatment, we will call you to review the results.   Follow-Up: At [facility], you and your health needs are our priority.  As part of our continuing mission to provide you with exceptional heart care, our providers are all part of one team.  This team includes your primary Cardiologist (physician) and Advanced Practice Providers or APPs (Physician Assistants and Nurse Practitioners) who all work together to provide you with the care you need, when you need it.  Your next appointment:   12 month(s)  Provider:   Lauro Portal, MD     We recommend signing up for the patient portal called "MyChart".  Sign up information is provided on this After Visit Summary.  MyChart is used to connect with patients for Virtual Visits (Telemedicine).  Patients are able to view lab/test results, encounter notes, upcoming appointments, etc.  Non-urgent messages can be sent to your provider as well.   To learn more about what you can do with MyChart, go to [email or web address].   Other Instructions       1st Floor: - Lobby - Registration  - Pharmacy  - Lab - Cafe  2nd Floor: - PV Lab - Diagnostic Testing (echo, CT, nuclear med)  3rd Floor: - Vacant  4th Floor: - TCTS (cardiothoracic surgery) - AFib Clinic - Structural Heart Clinic - Vascular Surgery  - Vascular  Ultrasound  5th Floor: - HeartCare Cardiology (general and EP) - Clinical Pharmacy for coumadin, hypertension, lipid, weight-loss medications, and med management appointments    Valet parking services will be available as well.

## 2023-09-04 LAB — LIPID PANEL
Chol/HDL Ratio: 2.8 ratio (ref 0.0–5.0)
Cholesterol, Total: 140 mg/dL (ref 100–199)
HDL: 50 mg/dL (ref >39)
LDL Chol Calc (NIH): 73 mg/dL (ref 0–99)
Triglycerides: 90 mg/dL (ref 0–149)
VLDL Cholesterol Cal: 17 mg/dL (ref 5–40)

## 2023-09-04 LAB — COMPREHENSIVE METABOLIC PANEL WITH GFR
ALT: 19 IU/L (ref 0–44)
AST: 26 IU/L (ref 0–40)
Albumin: 4.3 g/dL (ref 3.8–4.8)
Alkaline Phosphatase: 54 IU/L (ref 44–121)
BUN/Creatinine Ratio: 17 (ref 10–24)
BUN: 19 mg/dL (ref 8–27)
Bilirubin Total: 1.3 mg/dL — ABNORMAL HIGH (ref 0.0–1.2)
CO2: 23 mmol/L (ref 20–29)
Calcium: 9 mg/dL (ref 8.6–10.2)
Chloride: 106 mmol/L (ref 96–106)
Creatinine, Ser: 1.14 mg/dL (ref 0.76–1.27)
Globulin, Total: 1.9 g/dL (ref 1.5–4.5)
Glucose: 92 mg/dL (ref 70–99)
Potassium: 4.3 mmol/L (ref 3.5–5.2)
Sodium: 141 mmol/L (ref 134–144)
Total Protein: 6.2 g/dL (ref 6.0–8.5)
eGFR: 65 mL/min/{1.73_m2} (ref >59)

## 2024-04-24 ENCOUNTER — Telehealth: Admitting: Physician Assistant

## 2024-04-24 DIAGNOSIS — J209 Acute bronchitis, unspecified: Secondary | ICD-10-CM

## 2024-04-24 NOTE — Progress Notes (Signed)
 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 Just as with appointments in the office, your consent must be obtained to participate. Your consent will be active for this visit and any virtual visit you may have with one of our providers in the next 365 days. If you have a MyChart account, a copy of this consent can be sent to you electronically.  As this is a virtual visit, video technology does not allow for your provider to perform a traditional examination. This may limit your provider's ability to fully assess your condition. If your provider identifies any concerns that need to be evaluated in person or the need to arrange testing (such as labs, EKG, etc.), we will make arrangements to do so. Although advances in technology are sophisticated, we cannot ensure that it will always work on either your end or our end. If the connection with a video visit is poor, the visit may have to be switched to a telephone visit. With either a video or telephone visit, we are not always able to ensure that we have a secure connection.  By engaging in this virtual visit, you consent to the provision of healthcare and authorize for your insurance to be billed (if applicable) for the services provided during this visit. Depending on your insurance coverage, you may receive a charge related to this service.  I need to obtain your verbal consent now. Are you willing to proceed with your visit today? 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 Tyler Fox, [place]  Date: 04/24/2024 9:30 AM   Virtual Visit via Video Note   I, Tyler Fox, connected with  Tyler FORBES Prudent, MD  ([id number], 02/12/1946) on 04/24/2024 at  9:30 AM EST by a video-enabled telemedicine application and verified that I am speaking with the correct person using two identifiers.  Location: Patient:  Virtual Visit Location Patient: Home Provider: Virtual Visit Location Provider: Home Office   I discussed the limitations of evaluation and management by telemedicine and the availability of in person appointments. The patient expressed understanding and agreed to proceed.    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 Sputum has changed from dry to productive, and again from clear to purulent per patient (retired MD). Denies fever, chest pain or SOB. Notes this always progresses so his primary provider gives him a z-pack before things get worse. PCP is currently out of office.  OTC -- Nothing.   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  Resting comfortably  at home.  Head is normocephalic, atraumatic.  No labored breathing.  Speech is clear and coherent with logical content.  Patient is  alert and oriented at baseline.   Assessment and Plan: 1. Acute bronchitis, unspecified organism (Primary) - azithromycin (ZITHROMAX) 250 MG tablet; Take 2 tablets on day 1, then 1 tablet daily on days 2 through 5  Dispense: 6 tablet; Refill: 0  Discussed most likely viral but giving history and change in sputum, will add on  Azithromycin as a precaution at patient behest. Supportive measures and OTC medications reviewed. Cough medication declined. In-person follow-up precautions reviewed.  Follow Up Instructions: I discussed the assessment and treatment plan with the patient. The patient was provided an opportunity to ask questions and all were answered. The patient agreed with the plan and demonstrated an understanding of the instructions.  A copy of instructions were sent to the patient via MyChart unless otherwise noted below.   The patient was advised to call back or seek an in-person evaluation if the symptoms worsen or if the condition fails to improve as anticipated.    Tyler Velma Lunger, PA-C    [1]  Allergies Allergen Reactions   Ace Inhibitors Other (See Comments)    cough   Lisinopril Cough   Oxycodone      Hallucinations  [2]  Current Outpatient Medications:    azithromycin (ZITHROMAX) 250 MG tablet, Take 2 tablets on day 1, then 1 tablet daily on days 2 through 5, Disp: 6 tablet, Rfl: 0   aspirin  EC 81 MG EC tablet, Take 1 tablet (81 mg total) by mouth daily., Disp: , Rfl:    brinzolamide (AZOPT) 1 % ophthalmic suspension, Place 1 drop into both eyes in the morning and at bedtime., Disp: , Rfl:    cholecalciferol (VITAMIN D) 25 MCG (1000 UT) tablet, Take 1,000 Units by mouth daily., Disp: , Rfl:    Co-Enzyme Q-10 100 MG CAPS, Take 100 mg by mouth daily. , Disp: , Rfl:    COMBIGAN 0.2-0.5 % ophthalmic solution, Place 1 drop into both eyes 2 (two) times daily., Disp: , Rfl: 6   losartan  (COZAAR ) 25 MG tablet, Take 1 tablet (25 mg total) by mouth daily., Disp: 90 tablet, Rfl: 3   MEGARED OMEGA-3 KRILL OIL 500 MG CAPS, Take 1 capsule by mouth daily., Disp: , Rfl:    metoprolol  tartrate (LOPRESSOR ) 25 MG tablet, Take half a tablet by mouth twice daily, Disp: 90 tablet, Rfl: 3   Multiple Vitamin (MULTIVITAMIN WITH MINERALS) TABS, Take 1 tablet by mouth daily. alive, Disp: , Rfl:    RHOPRESSA 0.02 %  SOLN, BOTH EYES, Disp: , Rfl:    rosuvastatin  (CRESTOR ) 10 MG tablet, Take 1 tablet (10 mg total) by mouth daily., Disp: 90 tablet, Rfl: 3   tamsulosin (FLOMAX) 0.4 MG CAPS capsule, TAKE 1 CAPSULE BY MOUTH EVERYDAY AT BEDTIME (Patient taking differently: daily.), Disp: 90 capsule, Rfl: 3

## 2024-04-24 NOTE — Patient Instructions (Signed)
 Tyler FORBES Prudent, MD, thank you for joining Elsie Velma Lunger, PA-C for today's virtual visit.  While this provider is not your primary care provider (PCP), if your PCP is located in our provider database this encounter information will be shared with them immediately following your visit.   A [facility] MyChart account gives you access to today's visit and all your visits, tests, and labs performed at [facility]  click here if you don't have a [facility] MyChart account or go to [email or web address]  Consent: (Patient) Tyler FORBES Prudent, MD provided verbal consent for this virtual visit at the beginning of the encounter.  Current Medications:  Current Outpatient Medications:    aspirin  EC 81 MG EC tablet, Take 1 tablet (81 mg total) by mouth daily., Disp: , Rfl:    brinzolamide (AZOPT) 1 % ophthalmic suspension, Place 1 drop into both eyes in the morning and at bedtime., Disp: , Rfl:    cholecalciferol (VITAMIN D) 25 MCG (1000 UT) tablet, Take 1,000 Units by mouth daily., Disp: , Rfl:    Co-Enzyme Q-10 100 MG CAPS, Take 100 mg by mouth daily. , Disp: , Rfl:    COMBIGAN 0.2-0.5 % ophthalmic solution, Place 1 drop into both eyes 2 (two) times daily., Disp: , Rfl: 6   losartan  (COZAAR ) 25 MG tablet, Take 1 tablet (25 mg total) by mouth daily., Disp: 90 tablet, Rfl: 3   MEGARED OMEGA-3 KRILL OIL 500 MG CAPS, Take 1 capsule by mouth daily., Disp: , Rfl:    metoprolol  tartrate (LOPRESSOR ) 25 MG tablet, Take half a tablet by mouth twice daily, Disp: 90 tablet, Rfl: 3   Multiple Vitamin (MULTIVITAMIN WITH MINERALS) TABS, Take 1 tablet by mouth daily. alive, Disp: , Rfl:    RHOPRESSA 0.02 % SOLN, BOTH EYES, Disp: , Rfl:    rosuvastatin  (CRESTOR ) 10 MG tablet, Take 1 tablet (10 mg total) by mouth daily., Disp: 90 tablet, Rfl: 3   tamsulosin (FLOMAX) 0.4 MG CAPS capsule, TAKE 1 CAPSULE BY MOUTH EVERYDAY AT BEDTIME (Patient taking differently: daily.), Disp: 90 capsule, Rfl: 3    Medications ordered in this encounter:  No orders of the defined types were placed in this encounter.    *If you need refills on other medications prior to your next appointment, please contact your pharmacy*  Follow-Up: Call back or seek an in-person evaluation if the symptoms worsen or if the condition fails to improve as anticipated.  [facility] [phone]  Other Instructions Take antibiotic (Azithromycin) as directed.  Increase fluids.  Get plenty of rest. Use Mucinex for congestion. Take a daily probiotic (I recommend Align or Culturelle, but even Activia Yogurt may be beneficial).  A humidifier placed in the bedroom may offer some relief for a dry, scratchy throat of nasal irritation.  Read information below on acute bronchitis. If you note any non-resolving, new, or worsening symptoms despite treatment, please seek an in-person evaluation ASAP.   Acute Bronchitis Bronchitis is when the airways that extend from the windpipe into the lungs get red, puffy, and painful (inflamed). Bronchitis often causes thick spit (mucus) to develop. This leads to a cough. A cough is the most common symptom of bronchitis. In acute bronchitis, the condition usually begins suddenly and goes away over time (usually in 2 weeks). Smoking, allergies, and asthma can make bronchitis worse. Repeated episodes of bronchitis may cause more lung problems.  HOME CARE Rest. Drink enough fluids to keep your pee (urine) clear or pale yellow (  unless you need to limit fluids as told by your doctor). Only take over-the-counter or prescription medicines as told by your doctor. Avoid smoking and secondhand smoke. These can make bronchitis worse. If you are a smoker, think about using nicotine gum or skin patches. Quitting smoking will help your lungs heal faster. Reduce the chance of getting bronchitis again by: Washing your hands often. Avoiding people with cold symptoms. Trying not to touch your  hands to your mouth, nose, or eyes. Follow up with your doctor as told.  GET HELP IF: Your symptoms do not improve after 1 week of treatment. Symptoms include: Cough. Fever. Coughing up thick spit. Body aches. Chest congestion. Chills. Shortness of breath. Sore throat.  GET HELP RIGHT AWAY IF:  You have an increased fever. You have chills. You have severe shortness of breath. You have bloody thick spit (sputum). You throw up (vomit) often. You lose too much body fluid (dehydration). You have a severe headache. You faint.  MAKE SURE YOU:  Understand these instructions. Will watch your condition. Will get help right away if you are not doing well or get worse. Document Released: 10/18/2007 Document Revised: 01/01/2013 Document Reviewed: 10/22/2012 [facility] Patient Information 2015 [place], [place]. This information is not intended to replace advice given to you by your health care provider. Make sure you discuss any questions you have with your health care provider.    If you have been instructed to have an in-person evaluation today at a local Urgent Care facility, please use the link below. It will take you to a list of all of our available [facility], including address, phone number and hours of operation. Please do not delay care.  [facility]  If you or a family member do not have a primary care provider, use the link below to schedule a visit and establish care. When you choose a [employer] primary care physician or advanced practice provider, you gain a long-term partner in health. Find a Primary Care Provider  Learn more about [facility] in-office and virtual care options: [facility] Get Care Now

## 2024-09-03 ENCOUNTER — Ambulatory Visit: Admitting: Cardiovascular Disease
